# Patient Record
Sex: Female | Born: 1950 | Race: White | Hispanic: No | Marital: Married | Smoking: Never smoker
Health system: Southern US, Community
[De-identification: ages and names within clinical notes are randomized; demographics above are authoritative.]

## PROBLEM LIST (undated history)

## (undated) DIAGNOSIS — Z9221 Personal history of antineoplastic chemotherapy: Secondary | ICD-10-CM

## (undated) DIAGNOSIS — C801 Malignant (primary) neoplasm, unspecified: Secondary | ICD-10-CM

## (undated) HISTORY — PX: APPENDECTOMY: SHX54

## (undated) HISTORY — PX: TUBAL LIGATION: SHX77

## (undated) HISTORY — PX: BREAST SURGERY: SHX581

---

## 1995-10-13 DIAGNOSIS — Z853 Personal history of malignant neoplasm of breast: Secondary | ICD-10-CM | POA: Insufficient documentation

## 1996-06-01 ENCOUNTER — Encounter: Payer: Self-pay | Admitting: Family Medicine

## 1996-07-12 HISTORY — PX: MASTECTOMY: SHX3

## 2004-07-14 ENCOUNTER — Ambulatory Visit: Payer: Self-pay | Admitting: General Surgery

## 2004-10-12 LAB — HM COLONOSCOPY: HM Colonoscopy: NORMAL

## 2005-01-08 ENCOUNTER — Ambulatory Visit: Payer: Self-pay | Admitting: Oncology

## 2005-08-26 ENCOUNTER — Ambulatory Visit: Payer: Self-pay | Admitting: General Surgery

## 2006-10-25 ENCOUNTER — Ambulatory Visit: Payer: Self-pay | Admitting: General Surgery

## 2007-03-13 LAB — CONVERTED CEMR LAB: Pap Smear: NORMAL

## 2007-10-28 ENCOUNTER — Ambulatory Visit: Payer: Self-pay | Admitting: General Surgery

## 2008-11-12 ENCOUNTER — Ambulatory Visit: Payer: Self-pay | Admitting: General Surgery

## 2008-11-12 LAB — HM MAMMOGRAPHY: HM Mammogram: NORMAL

## 2008-11-22 ENCOUNTER — Encounter: Payer: Self-pay | Admitting: Family Medicine

## 2009-01-02 ENCOUNTER — Ambulatory Visit: Payer: Self-pay | Admitting: Family Medicine

## 2009-01-02 DIAGNOSIS — F325 Major depressive disorder, single episode, in full remission: Secondary | ICD-10-CM

## 2009-01-02 DIAGNOSIS — K219 Gastro-esophageal reflux disease without esophagitis: Secondary | ICD-10-CM | POA: Insufficient documentation

## 2009-01-02 DIAGNOSIS — I10 Essential (primary) hypertension: Secondary | ICD-10-CM | POA: Insufficient documentation

## 2009-01-02 DIAGNOSIS — M5030 Other cervical disc degeneration, unspecified cervical region: Secondary | ICD-10-CM | POA: Insufficient documentation

## 2009-01-02 DIAGNOSIS — G4700 Insomnia, unspecified: Secondary | ICD-10-CM | POA: Insufficient documentation

## 2009-01-02 DIAGNOSIS — K5909 Other constipation: Secondary | ICD-10-CM | POA: Insufficient documentation

## 2009-01-02 DIAGNOSIS — G43009 Migraine without aura, not intractable, without status migrainosus: Secondary | ICD-10-CM | POA: Insufficient documentation

## 2009-01-02 DIAGNOSIS — F331 Major depressive disorder, recurrent, moderate: Secondary | ICD-10-CM | POA: Insufficient documentation

## 2009-03-13 ENCOUNTER — Ambulatory Visit: Payer: Self-pay | Admitting: Family Medicine

## 2009-03-13 LAB — CONVERTED CEMR LAB
AST: 19 units/L (ref 0–37)
Albumin: 3.9 g/dL (ref 3.5–5.2)
CO2: 30 meq/L (ref 19–32)
Chloride: 113 meq/L — ABNORMAL HIGH (ref 96–112)
Cholesterol: 193 mg/dL (ref 0–200)
Creatinine, Ser: 0.9 mg/dL (ref 0.4–1.2)
GFR calc non Af Amer: 68.6 mL/min (ref >60)
HDL: 38.8 mg/dL — ABNORMAL LOW (ref >39.00)
Potassium: 4.6 meq/L (ref 3.5–5.1)
Sodium: 145 meq/L (ref 135–145)
Total Bilirubin: 0.8 mg/dL (ref 0.3–1.2)
Total Protein: 6.8 g/dL (ref 6.0–8.3)
Triglycerides: 96 mg/dL (ref 0.0–149.0)

## 2009-03-19 ENCOUNTER — Encounter: Payer: Self-pay | Admitting: Family Medicine

## 2009-03-19 ENCOUNTER — Ambulatory Visit: Payer: Self-pay | Admitting: Family Medicine

## 2009-03-19 ENCOUNTER — Other Ambulatory Visit: Admission: RE | Admit: 2009-03-19 | Discharge: 2009-03-19 | Payer: Self-pay | Admitting: Family Medicine

## 2009-03-19 DIAGNOSIS — N39498 Other specified urinary incontinence: Secondary | ICD-10-CM | POA: Insufficient documentation

## 2009-03-19 DIAGNOSIS — R7309 Other abnormal glucose: Secondary | ICD-10-CM | POA: Insufficient documentation

## 2009-03-19 DIAGNOSIS — E7800 Pure hypercholesterolemia, unspecified: Secondary | ICD-10-CM | POA: Insufficient documentation

## 2009-03-19 LAB — CONVERTED CEMR LAB
Blood in Urine, dipstick: NEGATIVE
Ketones, urine, test strip: NEGATIVE
Specific Gravity, Urine: >=1.03
Urobilinogen, UA: 0.2
WBC Urine, dipstick: NEGATIVE

## 2009-03-22 ENCOUNTER — Encounter (INDEPENDENT_AMBULATORY_CARE_PROVIDER_SITE_OTHER): Payer: Self-pay | Admitting: *Deleted

## 2009-03-27 ENCOUNTER — Encounter: Payer: Self-pay | Admitting: Family Medicine

## 2009-05-09 ENCOUNTER — Encounter: Payer: Self-pay | Admitting: Family Medicine

## 2009-05-29 ENCOUNTER — Ambulatory Visit: Payer: Self-pay | Admitting: Urology

## 2009-06-19 ENCOUNTER — Ambulatory Visit: Payer: Self-pay | Admitting: Family Medicine

## 2009-06-20 LAB — CONVERTED CEMR LAB
ALT: 16 units/L (ref 0–35)
AST: 17 units/L (ref 0–37)
Albumin: 4 g/dL (ref 3.5–5.2)
Alkaline Phosphatase: 99 units/L (ref 39–117)
Bilirubin, Direct: 0 mg/dL (ref 0.0–0.3)
GFR calc non Af Amer: 68.53 mL/min (ref >60)
LDL Cholesterol: 132 mg/dL — ABNORMAL HIGH (ref 0–99)
Total Bilirubin: 0.7 mg/dL (ref 0.3–1.2)
Total CHOL/HDL Ratio: 5
Total Protein: 6.9 g/dL (ref 6.0–8.3)
VLDL: 14.8 mg/dL (ref 0.0–40.0)

## 2009-08-08 ENCOUNTER — Ambulatory Visit: Payer: Self-pay | Admitting: Family Medicine

## 2009-08-22 ENCOUNTER — Ambulatory Visit: Payer: Self-pay | Admitting: Otolaryngology

## 2009-11-13 ENCOUNTER — Ambulatory Visit: Payer: Self-pay | Admitting: General Surgery

## 2009-11-19 ENCOUNTER — Encounter: Payer: Self-pay | Admitting: Family Medicine

## 2009-11-26 ENCOUNTER — Ambulatory Visit: Payer: Self-pay | Admitting: Family Medicine

## 2009-11-27 LAB — CONVERTED CEMR LAB
ALT: 16 units/L (ref 0–35)
Alkaline Phosphatase: 98 units/L (ref 39–117)
BUN: 15 mg/dL (ref 6–23)
CO2: 29 meq/L (ref 19–32)
Calcium: 9 mg/dL (ref 8.4–10.5)
Cholesterol: 199 mg/dL (ref 0–200)
GFR calc non Af Amer: 78.39 mL/min (ref >60)
Glucose, Bld: 99 mg/dL (ref 70–99)
HDL: 46.5 mg/dL (ref >39.00)
Total Bilirubin: 0.4 mg/dL (ref 0.3–1.2)

## 2010-02-21 ENCOUNTER — Ambulatory Visit: Payer: Self-pay | Admitting: Family Medicine

## 2010-02-25 LAB — CONVERTED CEMR LAB
AST: 20 units/L (ref 0–37)
Cholesterol: 157 mg/dL (ref 0–200)
Triglycerides: 117 mg/dL (ref 0.0–149.0)
VLDL: 23.4 mg/dL (ref 0.0–40.0)

## 2010-02-26 ENCOUNTER — Ambulatory Visit: Payer: Self-pay | Admitting: Family Medicine

## 2010-03-06 ENCOUNTER — Encounter: Admission: RE | Admit: 2010-03-06 | Discharge: 2010-03-06 | Payer: Self-pay | Admitting: Family Medicine

## 2010-05-14 ENCOUNTER — Telehealth: Payer: Self-pay | Admitting: Family Medicine

## 2010-05-16 ENCOUNTER — Ambulatory Visit: Payer: Self-pay | Admitting: Family Medicine

## 2010-05-16 DIAGNOSIS — G4730 Sleep apnea, unspecified: Secondary | ICD-10-CM | POA: Insufficient documentation

## 2010-05-22 ENCOUNTER — Telehealth (INDEPENDENT_AMBULATORY_CARE_PROVIDER_SITE_OTHER): Payer: Self-pay | Admitting: *Deleted

## 2010-05-23 ENCOUNTER — Ambulatory Visit: Payer: Self-pay

## 2010-05-27 ENCOUNTER — Ambulatory Visit: Payer: Self-pay

## 2010-05-27 ENCOUNTER — Encounter: Payer: Self-pay | Admitting: Family Medicine

## 2010-06-12 ENCOUNTER — Inpatient Hospital Stay: Payer: Self-pay

## 2010-07-07 ENCOUNTER — Telehealth: Payer: Self-pay | Admitting: Family Medicine

## 2010-07-22 ENCOUNTER — Ambulatory Visit: Payer: Self-pay

## 2010-08-07 ENCOUNTER — Telehealth: Payer: Self-pay | Admitting: Family Medicine

## 2010-08-27 ENCOUNTER — Ambulatory Visit: Payer: Self-pay | Admitting: Family Medicine

## 2010-08-28 LAB — CONVERTED CEMR LAB
ALT: 15 units/L (ref 0–35)
BUN: 15 mg/dL (ref 6–23)
CO2: 29 meq/L (ref 19–32)
Chloride: 105 meq/L (ref 96–112)
Cholesterol: 175 mg/dL (ref 0–200)
HDL: 46.2 mg/dL (ref >39.00)
LDL Cholesterol: 108 mg/dL — ABNORMAL HIGH (ref 0–99)
Potassium: 3.5 meq/L (ref 3.5–5.1)
Sodium: 142 meq/L (ref 135–145)
Total Bilirubin: 0.5 mg/dL (ref 0.3–1.2)

## 2010-09-09 ENCOUNTER — Ambulatory Visit: Payer: Self-pay | Admitting: Family Medicine

## 2010-09-09 DIAGNOSIS — G2581 Restless legs syndrome: Secondary | ICD-10-CM | POA: Insufficient documentation

## 2010-09-09 LAB — CONVERTED CEMR LAB: HDL goal, serum: 40 mg/dL

## 2010-09-23 ENCOUNTER — Ambulatory Visit: Payer: Self-pay

## 2010-09-23 ENCOUNTER — Encounter: Payer: Self-pay | Admitting: Family Medicine

## 2010-10-07 ENCOUNTER — Telehealth: Payer: Self-pay | Admitting: Family Medicine

## 2010-10-12 HISTORY — PX: REDUCTION MAMMAPLASTY: SUR839

## 2010-11-10 ENCOUNTER — Telehealth: Payer: Self-pay | Admitting: Family Medicine

## 2010-11-11 NOTE — Op Note (Signed)
Summary: Delay of TRAM Myocutaneous Flap/[facility]  Delay of TRAM Myocutaneous Flap/[facility]   Imported By: Lanelle Bal 08/12/2010 15:23:59  _____________________________________________________________________  External Attachment:    Type:   Image     Comment:   External Document

## 2010-11-11 NOTE — Progress Notes (Signed)
Summary: refill req for zolpidem  Phone Note Refill Request Message from:  Fax from Pharmacy on July 07, 2010 8:37 AM  Refills Requested: Medication #1:  ZOLPIDEM TARTRATE 10 MG TABS take one by mouth daily   Last Refilled: 05/23/2010 [facility] [address] [phone]  Initial call taken by: Melody Comas,  July 07, 2010 8:38 AM  Follow-up for Phone Call        Rx called to pharmacy Follow-up by: Benny Lennert CMA Duncan Dull),  July 07, 2010 10:08 AM    Prescriptions: ZOLPIDEM TARTRATE 10 MG TABS (ZOLPIDEM TARTRATE) take one by mouth daily  #30 x 0   Entered and Authorized by:   Kerby Nora MD   Signed by:   Kerby Nora MD on 07/07/2010   Method used:   Telephoned to ...       [facility] [phone]* (retail)       [address] [place], [place]  [address]       Ph: [phone] or [phone]       Fax: [phone]   RxID:   [id number]

## 2010-11-11 NOTE — Progress Notes (Signed)
Summary: Surgical clearance  Phone Note From Other Clinic Call back at fax [phone]   Caller: [facility] Call For: Dr. Ermalene Searing Summary of Call: Office calling to received surgical clearance notes/documentation.  Patient will be have breast reconstructive surgery in September.  Please fax to above fax number. Initial call taken by: Linde Gillis CMA Duncan Dull),  May 14, 2010 8:51 AM  Follow-up for Phone Call        Discussed with surgeon..low risk pt with moderate risk surgery. Recent pro op eval 07/2009.Marland KitchenMarland KitchenShe does not need a repeat appt unless she has had any health changes. Surgeon will order labs, EKG and CXR... if these are normal ..there is no contrindication to surgery. Please call pt or breast surgon office..do they need note or have forms to complete.  Follow-up by: Kerby Nora MD,  May 14, 2010 5:36 PM  Additional Follow-up for Phone Call Additional follow up Details #1::        Patient is coming in tomorrow for appt for surgical clearence.Consuello Masse CMA   Additional Follow-up by: Benny Lennert CMA Duncan Dull),  May 15, 2010 8:05 AM

## 2010-11-11 NOTE — Assessment & Plan Note (Signed)
Summary: BACK,HIP PAIN/CLE   Vital Signs:  Patient profile:   60 year old female Height:      65 inches Weight:      174.2 pounds BMI:     29.09 Temp:     98.3 degrees F oral Pulse rate:   80 / minute Pulse rhythm:   regular BP sitting:   130 / 80  (left arm) Cuff size:   regular  Vitals Entered By: Benny Lennert CMA Duncan Dull) (Feb 26, 2010 11:44 AM)  History of Present Illness: Chief complaint back and hip pain  60 year old female:  Back and hip pain.  s/p Breast CA  low back pain, now has moved some into her hip  also with some pain in her left groin ongoing for a couple of months.  Does a lot of lifting  Works at [employer].  Had a piched nerve.    Back Pain History:      The patient's back pain has been present for > 6 weeks.  The pain is located in the lower back region and does not radiate below the knees.  She states that she has had a prior history of back pain.  The patient has not had any recent physical therapy for her back pain.    Critical Exclusionary Diagnosis Criteria (CEDC) for Back Pain:      The patient denies a history of previous trauma.  She has no prior history of spinal surgery.  There are no symptoms to suggest infection, cauda equina, or psychosocial factors for back pain.  Cancer risk factors include age >50 yrs with new back pain, past medical history of cancer, and no improvement in low back pain after 4-6 weeks therapy.    Allergies (verified): No Known Drug Allergies  Past History:  Past medical, surgical, family and social histories (including risk factors) reviewed, and no changes noted (except as noted below).  Past Medical History: Reviewed history from 01/02/2009 and no changes required. Hypertension Depression GERD  Past Surgical History: Reviewed history from 01/02/2009 and no changes required. right mastectomy and chemotherapy.Marland KitchenMarland KitchenSees Dr. Linton Rump Choski Onc Appendectomy BTL   Family History: Reviewed history  from 01/02/2009 and no changes required. father: deceased, DM, HTN, Parkinson's, melanoma mother: deceased,DM, HTN, CVA 2 sisters: arthritis, HTN MGM: CVA PGM: CVA PGF: MI < age 3  Social History: Reviewed history from 01/02/2009 and no changes required. Occupation:works at [employer], Dispensing optician Married No children Never Smoked Alcohol use-no Drug use-no Regular exercise-no Diet: fast food, sweets  Review of Systems       REVIEW OF SYSTEMS  GEN: No systemic complaints, no fevers, chills, sweats, or other acute illnesses MSK: Detailed in the HPI GI: tolerating PO intake without difficulty Neuro: No numbness, parasthesias, or tingling associated. Otherwise the pertinent positives of the ROS are noted above.    Physical Exam  General:  GEN: Well-developed,well-nourished,in no acute distress; alert,appropriate and cooperative throughout examination HEENT: Normocephalic and atraumatic without obvious abnormalities. No apparent alopecia or balding. Ears, externally no deformities PULM: Breathing comfortably in no respiratory distress EXT: No clubbing, cyanosis, or edema PSYCH: Normally interactive. Cooperative during the interview. Pleasant. Friendly and conversant. Not anxious or depressed appearing. Normal, full affect.  Msk:  Normal Greater trochanteric bursae Full hip ROM Negative Faber Negative Reverse Faber Sciatic Notches: mildly ttp Sensation to Gross touch WNL Sensation to pinpricnk WNL DTR 2+ knee and ankle no clonus DP and PT pulses are normal B   Low Back  Pain Physical Exam:    Inspection-deformity:     No    Palpation-spinal tenderness:   No    Motor Exam/Strength:         Left Ankle Dorsiflexion (L5,L4):     normal       Left Great Toe Dorsiflexion (L5,L4):     normal       Left Heel Walk (L5,some L4):     normal       Left Single Squat & Rise-Quads (L4):   normal       Left Toe Walk-calf (S1):       normal       Right Ankle Dorsiflexion  (L5,L4):     normal       Right Great Toe Dorsiflexion (L5,L4):       normal       Right Heel Walk (L5,some L4):     normal       Right Single Squat & Rise Quads (L4):   normal       Right Toe Walk-calf (S1):       normal    Sensory Exam/Pinprick:        Left Medial Foot (L4):   normal       Left Dorsal Foot (L5):   normal       Left Lateral Foot (S1):   normal       Right Medial Foot (L4):   normal       Right Dorsal Foot (L5):   normal       Right Lateral Foot (S1):   normal    Reflexes:        Left Knee Jerk (L4):     normal       Left Ankle Reflex (S1):   normal       Right Knee Jerk:     normal       Right Ankle Reflex (S1):   normal    Straight Leg Raise (SLR):       Left Straight Leg Raise (SLR):   negative       Right Straight Leg Raise (SLR):   negative   Impression & Recommendations:  Problem # 1:  BACK PAIN, LUMBAR (ICD-724.2) Assessment New Mechanical back pain  proceed cons activity mod, weight loss, core rehab  Her updated medication list for this problem includes:    Flexeril 10 Mg Tabs (Cyclobenzaprine hcl) .Marland Kitchen... Take one by mouth as directed as needed    Ketoprofen 75 Mg Caps (Ketoprofen) .Marland Kitchen... Take one by mouth as directed as needed    Tizanidine Hcl 4 Mg Tabs (Tizanidine hcl) .Marland Kitchen... 1 by mouth at bedtime as needed back pain / spasm  Problem # 2:  HIP PAIN, BILATERAL (ICD-719.45) Assessment: New  Her updated medication list for this problem includes:    Flexeril 10 Mg Tabs (Cyclobenzaprine hcl) .Marland Kitchen... Take one by mouth as directed as needed    Ketoprofen 75 Mg Caps (Ketoprofen) .Marland Kitchen... Take one by mouth as directed as needed    Tizanidine Hcl 4 Mg Tabs (Tizanidine hcl) .Marland Kitchen... 1 by mouth at bedtime as needed back pain / spasm  Complete Medication List: 1)  Venlafaxine Hcl 75 Mg Tabs (Venlafaxine hcl) .... Take one by mouth daily 2)  Toprol Xl 100 Mg Xr24h-tab (Metoprolol succinate) .... Take one by mouth daily 3)  Omeprazole 20 Mg Cpdr (Omeprazole) .... Take  one by mouth daily 4)  Zolpidem Tartrate 10 Mg Tabs (Zolpidem tartrate) .... Take one by  mouth daily 5)  Flexeril 10 Mg Tabs (Cyclobenzaprine hcl) .... Take one by mouth as directed as needed 6)  Ketoprofen 75 Mg Caps (Ketoprofen) .... Take one by mouth as directed as needed 7)  Amerge 2.5 Mg Tabs (Naratriptan hcl) .... Take by mouth as directed as needed 8)  Senna Plus  .... Take by mouth as directed 9)  Calcium 600/vitamin D 600-400 Mg-unit Tabs (Calcium carbonate-vitamin d) .Marland Kitchen.. 1 tab by mouth two times a day 10)  Phendimetrazine Tartrate 35 Mg Tabs (Phendimetrazine tartrate) .... Take 1/2 to 1 tablet by mouth daily 11)  Simvastatin 20 Mg Tabs (Simvastatin) .... Take 1 tablet by mouth once a day 12)  Tizanidine Hcl 4 Mg Tabs (Tizanidine hcl) .Marland Kitchen.. 1 by mouth at bedtime as needed back pain / spasm  Other Orders: Radiology Referral (Radiology)  Patient Instructions: 1)  Referral Appointment Information 2)  Day/Date: 3)  Time: 4)  Place/MD: 5)  Address: 6)  Phone/Fax: 7)  Patient given appointment information. Information/Orders faxed/mailed.  Prescriptions: TIZANIDINE HCL 4 MG TABS (TIZANIDINE HCL) 1 by mouth at bedtime as needed back pain / spasm  #30 x 3   Entered and Authorized by:   Hannah Beat MD   Signed by:   Hannah Beat MD on 02/26/2010   Method used:   Print then Give to Patient   RxID:   [id number]   Current Allergies (reviewed today): No known allergies

## 2010-11-11 NOTE — Letter (Signed)
Summary: [facility]  [facility]   Imported By: Lanelle Bal 12/04/2009 13:00:45  _____________________________________________________________________  External Attachment:    Type:   Image     Comment:   External Document

## 2010-11-11 NOTE — Assessment & Plan Note (Signed)
Summary: PRE OP CLEARANCE/CLE   Vital Signs:  Patient profile:   60 year old female Height:      65 inches Weight:      177.0 pounds BMI:     29.56 Temp:     99.4 degrees F oral Pulse rate:   80 / minute Pulse rhythm:   regular BP sitting:   118 / 80  (left arm) Cuff size:   regular  Vitals Entered By: Benny Lennert CMA Duncan Dull) (May 16, 2010 11:14 AM)  History of Present Illness: Chief complaint pre op clearence.Beth Fox CMA    Upcoming breast reconstruction Surgery. 9/1 Has preop appt at hopsital in [facility].  Discussed on phone with Dr. Meriam Sprague. She with have CXR, EKG, labs done in hospital.   Depression , wel controlled on venlafaxine.  No SI.   Untreat sleep apnea...did not tolerate CPAP when tried 6-8 months ago.   Hypertension History:      She denies headache, chest pain, palpitations, dyspnea with exertion, peripheral edema, neurologic problems, and side effects from treatment.        Positive major cardiovascular risk factors include female age 22 years old or older, hyperlipidemia, and hypertension.  Negative major cardiovascular risk factors include non-tobacco-user status.     Allergies (verified): No Known Drug Allergies  Review of Systems General:  Denies fatigue and fever; Increased stress a twork and home. CV:  Denies chest pain or discomfort. Resp:  Denies shortness of breath. GI:  Denies abdominal pain, bloody stools, constipation, and diarrhea. GU:  Denies abnormal vaginal bleeding and dysuria. Derm:  Denies rash. Psych:  Denies anxiety.  Physical Exam  General:  GEN: Well-developed,well-nourished,in no acute distress; alert,appropriate and cooperative throughout examination HEENT: Normocephalic and atraumatic without obvious abnormalities. No apparent alopecia or balding. Ears, externally no deformities PULM: Breathing comfortably in no respiratory distress EXT: No clubbing, cyanosis, or edema PSYCH: Normally interactive. Cooperative  during the interview. Pleasant. Friendly and conversant. Not anxious or depressed appearing. Normal, full affect.  Eyes:  No corneal or conjunctival inflammation noted. EOMI. Perrla. Eyelids droopy B, left greater than right Vision grossly normal. Ears:  External ear exam shows no significant lesions or deformities.  Otoscopic examination reveals clear canals, tympanic membranes are intact bilaterally without bulging, retraction, inflammation or discharge. Hearing is grossly normal bilaterally. Nose:  External nasal examination shows no deformity or inflammation. Nasal mucosa are pink and moist without lesions or exudates. Mouth:  small oropharynx Neck:  no cervical or supraclavicular lymphadenopathy no carotid bruit or thyromegaly  Full ROM of neck Lungs:  Normal respiratory effort, chest expands symmetrically. Lungs are clear to auscultation, no crackles or wheezes. Heart:  Normal rate and regular rhythm. S1 and S2 normal without gallop, murmur, click, rub or other extra sounds. Abdomen:  Bowel sounds positive,abdomen soft and non-tender without masses, organomegaly or hernias noted. Pulses:  R and L posterior tibial pulses are full and equal bilaterally  Extremities:  no edmea Skin:  Intact without suspicious lesions or rashes   Impression & Recommendations:  Problem # 1:  PREOPERATIVE EXAMINATION (ICD-V72.84) Moderate risk procedure in low risk pt. Will clear for suregery when lab/tests return.  Problem # 2:  SLEEP APNEA (ICD-780.57) Counseled on risks of untreated sleep apnea.  Recommended discussing with sleep MD or center other CPAP mask etc /other treatment options.   Complete Medication List: 1)  Venlafaxine Hcl 75 Mg Tabs (Venlafaxine hcl) .... Take one by mouth daily 2)  Toprol Xl  100 Mg Xr24h-tab (Metoprolol succinate) .... Take one by mouth daily 3)  Omeprazole 20 Mg Cpdr (Omeprazole) .... Take one by mouth daily 4)  Zolpidem Tartrate 10 Mg Tabs (Zolpidem tartrate) .... Take  one by mouth daily 5)  Flexeril 10 Mg Tabs (Cyclobenzaprine hcl) .... Take one by mouth as directed as needed 6)  Ketoprofen 75 Mg Caps (Ketoprofen) .... Take one by mouth as directed as needed 7)  Amerge 2.5 Mg Tabs (Naratriptan hcl) .... Take by mouth as directed as needed 8)  Senna Plus  .... Take by mouth as directed 9)  Calcium 600/vitamin D 600-400 Mg-unit Tabs (Calcium carbonate-vitamin d) .Marland Kitchen.. 1 tab by mouth two times a day 10)  Phendimetrazine Tartrate 35 Mg Tabs (Phendimetrazine tartrate) .... Take 1/2 to 1 tablet by mouth daily 11)  Simvastatin 20 Mg Tabs (Simvastatin) .... Take 1 tablet by mouth once a day 12)  Tizanidine Hcl 4 Mg Tabs (Tizanidine hcl) .Marland Kitchen.. 1 by mouth at bedtime as needed back pain / spasm  Hypertension Assessment/Plan:      The patient's hypertensive risk group is category B: At least one risk factor (excluding diabetes) with no target organ damage.  Her calculated 10 year risk of coronary heart disease is 8 %.  Today's blood pressure is 118/80.  Her blood pressure goal is < 140/90.  Patient Instructions: 1)  Consider returning to sleep center for CPA/other treamtetn recommendations. Let them know it was not tolerated well.  2)   We will complete pre op paper work when labs recieved from [facility].  Current Allergies (reviewed today): No known allergies   Appended Document: PRE OP CLEARANCE/CLE Please print out this OV note and fax to surgeon. Labs reviewed..cleared for surgery.   Appended Document: PRE OP CLEARANCE/CLE faxed to surgeon.Beth Fox CMA

## 2010-11-11 NOTE — Progress Notes (Signed)
Summary: rx refills  Phone Note Refill Request Call back at Home Phone [phone] Message from:  Patient on May 22, 2010 9:39 AM  Refills Requested: Medication #1:  VENLAFAXINE HCL 75 MG TABS take one by mouth daily  Medication #2:  ZOLPIDEM TARTRATE 10 MG TABS take one by mouth daily [facility] [address]   Initial call taken by: Melody Comas,  May 22, 2010 9:39 AM    Prescriptions: ZOLPIDEM TARTRATE 10 MG TABS (ZOLPIDEM TARTRATE) take one by mouth daily  #30 x 0   Entered and Authorized by:   Kerby Nora MD   Signed by:   Kerby Nora MD on 05/23/2010   Method used:   Telephoned to ...       [facility] [phone]* (retail)       [address] [place], [place]  [address]       Ph: [phone] or [phone]       Fax: [phone]   RxID:   [id number] VENLAFAXINE HCL 75 MG TABS (VENLAFAXINE HCL) take one by mouth daily  #30 x 5   Entered and Authorized by:   Kerby Nora MD   Signed by:   Kerby Nora MD on 05/23/2010   Method used:   Telephoned to ...       [facility] [phone]* (retail)       [address] [place], [place]  [address]       Ph: [phone] or [phone]       Fax: [phone]   RxID:   [id number]   Appended Document: rx refills Rx's called to pharmacy.

## 2010-11-11 NOTE — Letter (Signed)
Summary: Records Dated 02-14-02 thru 03-11-05/[facility]  Records Dated 02-14-02 thru 03-11-05/[facility]   Imported By: Lanelle Bal 07/17/2010 13:21:54  _____________________________________________________________________  External Attachment:    Type:   Image     Comment:   External Document

## 2010-11-11 NOTE — Progress Notes (Signed)
Summary: zoldipem   Phone Note Refill Request Message from:  Fax from Pharmacy on August 07, 2010 5:16 PM  Refills Requested: Medication #1:  ZOLPIDEM TARTRATE 10 MG TABS take one by mouth daily   Last Refilled: 07/07/2010 [facility] [address] [phone]   Initial call taken by: Melody Comas,  August 07, 2010 5:17 PM  Follow-up for Phone Call        rx called to pharmacy.Consuello Masse CMA   Follow-up by: Benny Lennert CMA Duncan Dull),  August 08, 2010 11:34 AM    Prescriptions: ZOLPIDEM TARTRATE 10 MG TABS (ZOLPIDEM TARTRATE) take one by mouth daily  #30 x 0   Entered and Authorized by:   Kerby Nora MD   Signed by:   Kerby Nora MD on 08/08/2010   Method used:   Telephoned to ...       [facility] [phone]* (retail)       [address] [place], [place]  [address]       Ph: [phone] or [phone]       Fax: [phone]   RxID:   [id number]

## 2010-11-13 NOTE — Assessment & Plan Note (Signed)
Summary: TALK ABOUT MEDS/JRR   Vital Signs:  Patient profile:   60 year old female Height:      65 inches Weight:      160.8 pounds BMI:     26.86 Temp:     97.9 degrees F oral Pulse rate:   80 / minute Pulse rhythm:   regular BP sitting:   120 / 84  (left arm) Cuff size:   regular  Vitals Entered By: Benny Lennert CMA Duncan Dull) (September 09, 2010 11:53 AM)  History of Present Illness: Chief complaint talk about meds  Overdue for CPX.Marland Kitchen will schedule.  HTN:  Has been out of BP med in last month.. BPs..not checking at home. Here today, well controlled.   Since last OV... tram flap for breast reconstruction..06/2010.. has nipple surgery 12/15. Successful eyelid surgeries.  Prediabetes.. no improvement.   Depression.. stable on venlafaxine.   On ketoprophen and amerge.. for migraine..[facility] by Dr. Neale [facility]. Not planning on returning... mild headhaces only every 2 weeks.. Not required amerge in 1 year.  rarely using flexeril for neck (now improved)  Chornic constipation... uses senna every day.  Lifelong symptoms.  No blood in stool.   HAs upcoming routine colonoscopy with Dr. Birdie Sons.  LAst nml in 2006.  Dx with severe restless leg in past..during sleep study. Describes as cramping as well as creepy crawly feeling in legs.   Lipid Management History:      Positive NCEP/ATP III risk factors include female age 79 years old or older and hypertension.  Negative NCEP/ATP III risk factors include non-tobacco-user status.        Her compliance with the TLC diet is good.  The patient expresses understanding of adjunctive measures for cholesterol lowering.  Adjunctive measures started by the patient include aerobic exercise.  She expresses no side effects from her lipid-lowering medication.  The patient denies any symptoms to suggest myopathy or liver disease.     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 Take One By Mouth Daily 2)  Toprol Xl 100 Mg Xr24h-Tab (Metoprolol Succinate) .... Take One By Mouth Daily 3)  Omeprazole 20 Mg Cpdr (Omeprazole) .... Take One By Mouth Daily 4)  Zolpidem Tartrate 10 Mg Tabs (Zolpidem Tartrate) .... Take One By Mouth Daily 5)  Flexeril 10 Mg Tabs (Cyclobenzaprine Hcl) .... Take One By Mouth As Directed As Needed 6)  Ketoprofen 75 Mg Caps (Ketoprofen) .... Take One By Mouth As Directed As Needed 7)  Amerge 2.5 Mg Tabs (Naratriptan Hcl) .... Take By Mouth As Directed As Needed 8)  Senna Plus .... Take By Mouth As Directed 9)  Calcium 600/vitamin D 600-400 Mg-Unit Tabs (Calcium Carbonate-Vitamin D) .Marland Kitchen.. 1 Tab By Mouth Two Times A Day 10)  Simvastatin 20 Mg Tabs (Simvastatin) .... Take 1 Tablet By Mouth Once A Day 11)  Tizanidine Hcl 4 Mg Tabs (Tizanidine Hcl) .Marland KitchenMarland KitchenMarland Kitchen  1 By Mouth At Bedtime As Needed Back Pain / Spasm  Allergies (verified): No Known Drug Allergies  Past History:  Past medical, surgical, family and social histories (including risk factors) reviewed, and no changes noted (except as noted below).  Past Medical History: Reviewed history from 01/02/2009 and no changes required. Hypertension Depression GERD  Past Surgical History: Reviewed history from 01/02/2009 and no changes required. right mastectomy and chemotherapy.Marland KitchenMarland KitchenSees Dr.  Linton Rump Choski Onc Appendectomy BTL   Family History: Reviewed history from 01/02/2009 and no changes required. father: deceased, DM, HTN, Parkinson's, melanoma mother: deceased,DM, HTN, CVA 2 sisters: arthritis, HTN MGM: CVA PGM: CVA PGF: MI < age 30  Social History: Reviewed history from 01/02/2009 and no changes required. 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 CV:  Denies chest pain or discomfort. Resp:  Denies shortness of breath. GI:  Denies abdominal pain. GU:  Denies dysuria.  Physical Exam  General:  Well-developed,well-nourished,in no acute distress; alert,appropriate and cooperative throughout examination Eyes:  No corneal or conjunctival inflammation noted. EOMI. Perrla. Eyelids droopy B, left greater than right Vision grossly normal. Ears:  External ear exam shows no significant lesions or deformities.  Otoscopic examination reveals clear canals, tympanic membranes are intact bilaterally without bulging, retraction, inflammation or discharge. Hearing is grossly normal bilaterally. Nose:  External nasal examination shows no deformity or inflammation. Nasal mucosa are pink and moist without lesions or exudates. Mouth:  small oropharynx Neck:  no cervical or supraclavicular lymphadenopathy no carotid bruit or thyromegaly  Full ROM of neck Lungs:  Normal respiratory effort, chest expands symmetrically. Lungs are clear to auscultation, no crackles or wheezes. Heart:  Normal rate and regular rhythm. S1 and S2 normal without gallop, murmur, click, rub or other extra sounds. Abdomen:  Bowel sounds positive,abdomen soft and non-tender without masses, organomegaly or hernias noted. Pulses:  R and L posterior tibial pulses are full and equal bilaterally  Extremities:  no edmea Skin:  Intact  without suspicious lesions or rashes Psych:  Cognition and judgment appear intact. Alert and cooperative with normal attention span and concentration. No apparent delusions, illusions, hallucinations   Impression & Recommendations:  Problem # 1:  HYPERTENSION (ICD-401.9) Assessment Improved Well controlled of med today. HAs not been using toprol for last month. Follow Bps of med... if greater than 140/90 restart.  The following medications were removed from the medication list:    Toprol Xl 100 Mg Xr24h-tab (Metoprolol succinate) .Marland Kitchen... Take one by mouth daily  BP today: 120/84 Prior BP: 118/80 (05/16/2010)  Prior 10 Yr Risk Heart Disease: 8 % (05/16/2010)  Labs Reviewed: K+: 3.5 (08/27/2010) Creat: : 0.8 (08/27/2010)   Chol: 175 (08/27/2010)   HDL: 46.20 (08/27/2010)   LDL: 108 (08/27/2010)   TG: 105.0 (08/27/2010)  Problem # 2:  HYPERCHOLESTEROLEMIA (ICD-272.0) Assessment: Unchanged Well controlled. Continue current medication.  Her updated medication list for this problem includes:    Simvastatin 20 Mg Tabs (Simvastatin) .Marland Kitchen... Take 1 tablet by mouth once a day  Labs Reviewed: SGOT: 17 (08/27/2010)   SGPT: 15 (08/27/2010)  Prior 10 Yr Risk Heart Disease: 8 % (05/16/2010)   HDL:46.20 (08/27/2010), 43.30 (02/21/2010)  LDL:108 (08/27/2010), 90 (21/30/8657)  Chol:175 (08/27/2010), 157 (02/21/2010)  Trig:105.0 (08/27/2010), 117.0 (02/21/2010)  Problem # 3:  PREDIABETES (ICD-790.29) Assessment: Deteriorated Encouraged exercise, weight loss, healthy eating habits.   Problem # 4:  DEPRESSION (ICD-311) Assessment: Unchanged Well controlled. Continue current medication.  Her updated medication list for this problem includes:    Venlafaxine Hcl 75 Mg Tabs (Venlafaxine hcl) .Marland Kitchen... Take one by mouth daily  Problem # 5:  RESTLESS LEG SYNDROME (ICD-333.94) Assessment: Deteriorated Trial of ropinirole titrate up... use lowest dose needed.   Complete Medication List: 1)  Venlafaxine  Hcl 75 Mg Tabs (Venlafaxine hcl) .... Take one by mouth daily 2)  Omeprazole 20 Mg Cpdr (Omeprazole) .... Take one by mouth daily 3)  Zolpidem Tartrate 10 Mg Tabs (Zolpidem tartrate) .... Take one by mouth daily 4)  Ketoprofen 75 Mg Caps (Ketoprofen) .... Take one by mouth every 6 hours  as needed 5)  Amerge 2.5 Mg Tabs (Naratriptan hcl) .... Take by mouth as directed as needed 6)  Senna Plus  .... Take by mouth as directed 7)  Calcium 600/vitamin D 600-400 Mg-unit Tabs (Calcium carbonate-vitamin d) .Marland Kitchen.. 1 tab by mouth two times a day 8)  Simvastatin 20 Mg Tabs (Simvastatin) .... Take 1 tablet by mouth once a day 9)  Tizanidine Hcl 4 Mg Tabs (Tizanidine hcl) .Marland Kitchen.. 1 by mouth at bedtime as needed back pain / spasm 10)  Ropinirole Hcl 1 Mg Tabs (Ropinirole hcl) .... 1/2 tab by mouth at bedtime then if not improved ..increase to 1 tab by mouth at bedtime.  Lipid Assessment/Plan:      Based on NCEP/ATP III, the patient's risk factor category is "2 or more risk factors and a calculated 10 year CAD risk of < 20%".  The patient's lipid goals are as follows: Total cholesterol goal is 200; LDL cholesterol goal is 130; HDL cholesterol goal is 40; Triglyceride goal is 150.  Her LDL cholesterol goal has been met.    Patient Instructions: 1)  Schedule CPX in next few months.  2)  HOLD toprol. 3)   Check blood pressure every few days.Marland Kitchen if more than 3 numbers above 140/90.Marland Kitchenrestart toprol, call for prescription.  4)  Cut back on sweets in diet.Marland Kitchenas well as bread etc. 5)   Continue simvastatin. 6)  May try miralax daily for stool softner.  Prescriptions: ROPINIROLE HCL 1 MG TABS (ROPINIROLE HCL) 1/2 tab by mouth at bedtime then if not improved ..increase to 1 tab by mouth at bedtime.  #30 x 0   Entered and Authorized by:   Kerby Nora MD   Signed by:   Kerby Nora MD on 09/09/2010   Method used:   Electronically to        [facility] [phone]* (retail)       [address] [place], [place]  [address]       Ph: [phone] or [phone]       Fax: [phone]   RxID:   [id number] AMERGE 2.5 MG TABS (NARATRIPTAN HCL) take by mouth as directed as needed  #9 x 0   Entered and Authorized by:   Kerby Nora MD   Signed by:   Kerby Nora MD on 09/09/2010   Method used:   Electronically to        [facility] [phone]* (retail)       [address] [place], [place]  [address]       Ph: [phone] or [phone]       Fax:  [phone]   RxID:   [id number] KETOPROFEN 75 MG CAPS (KETOPROFEN) take one by mouth every 6 hours  as needed  #60 x 1   Entered and Authorized by:   Kerby Nora MD   Signed by:   Kerby Nora MD on 09/09/2010   Method used:   Electronically to        [facility] [phone]* (retail)       [address] [place], [place]  [address]       Ph: [phone] or [phone]       Fax: [phone]   RxID:   [id number] ZOLPIDEM TARTRATE 10 MG TABS (ZOLPIDEM TARTRATE) take one by mouth daily  #30 x 0   Entered and Authorized by:   Kerby Nora MD   Signed by:   Kerby Nora MD on 09/09/2010   Method used:   Print then Give to Patient   RxID:   [id number]    Orders Added: 1)  Est. Patient Level IV [93235]    Current Allergies (reviewed today): No known allergies   Last Flu Vaccine:  given (08/08/2009 1:45:26 PM) Flu Vaccine Result Date:  09/09/2010 Flu Vaccine Result:  given Flu Vaccine Next Due:  1 yr Last Colonoscopy:  normal (10/12/2004 10:40:54 AM) Colonoscopy Next Due:  5 yr

## 2010-11-13 NOTE — Progress Notes (Signed)
Summary: Beth Fox  Phone Note Refill Request Message from:  Patient on October 07, 2010 10:04 AM  Refills Requested: Medication #1:  ZOLPIDEM TARTRATE 10 MG TABS take one by mouth daily   Supply Requested: 1 month   [phone]    Method Requested: Telephone to Pharmacy Initial call taken by: Benny Lennert CMA Duncan Dull),  October 07, 2010 10:04 AM  Follow-up for Phone Call        Rx called to pharmacy Follow-up by: Benny Lennert CMA Duncan Dull),  October 07, 2010 1:08 PM    Prescriptions: ZOLPIDEM TARTRATE 10 MG TABS (ZOLPIDEM TARTRATE) take one by mouth daily  #30 x 0   Entered and Authorized by:   Kerby Nora MD   Signed by:   Kerby Nora MD on 10/07/2010   Method used:   Telephoned to ...       [facility] [phone]* (retail)       [address] [place], [place]  [address]       Ph: [phone] or [phone]       Fax: [phone]   RxID:   [id number]

## 2010-11-13 NOTE — Op Note (Signed)
Summary: Breast Reconstruction/[facility]  Breast Reconstruction/[facility]   Imported By: Lanelle Bal 10/20/2010 08:38:09  _____________________________________________________________________  External Attachment:    Type:   Image     Comment:   External Document

## 2010-11-19 NOTE — Progress Notes (Signed)
Summary: zolpidem tartrate  Phone Note Refill Request Message from:  Fax from Pharmacy on November 10, 2010 10:31 AM  Refills Requested: Medication #1:  ZOLPIDEM TARTRATE 10 MG TABS take one by mouth daily   Last Refilled: 10/07/2010 [facility] [address] [phone].   Initial call taken by: Melody Comas,  November 10, 2010 10:32 AM  Follow-up for Phone Call        Rx called to pharmacy Follow-up by: Benny Lennert CMA Duncan Dull),  November 10, 2010 11:39 AM    Prescriptions: ZOLPIDEM TARTRATE 10 MG TABS (ZOLPIDEM TARTRATE) take one by mouth daily  #30 x 0   Entered and Authorized by:   Kerby Nora MD   Signed by:   Kerby Nora MD on 11/10/2010   Method used:   Telephoned to ...       [facility] [phone]* (retail)       [address] [place], [place]  [address]       Ph: [phone] or [phone]       Fax: [phone]   RxID:   [id number]

## 2010-12-05 ENCOUNTER — Ambulatory Visit (INDEPENDENT_AMBULATORY_CARE_PROVIDER_SITE_OTHER): Payer: BC Managed Care – PPO | Admitting: Family Medicine

## 2010-12-05 ENCOUNTER — Encounter: Payer: Self-pay | Admitting: Family Medicine

## 2010-12-05 ENCOUNTER — Other Ambulatory Visit: Payer: Self-pay | Admitting: Family Medicine

## 2010-12-05 ENCOUNTER — Ambulatory Visit (INDEPENDENT_AMBULATORY_CARE_PROVIDER_SITE_OTHER)
Admission: RE | Admit: 2010-12-05 | Discharge: 2010-12-05 | Disposition: A | Discharge status: Home / Self Care | Payer: BC Managed Care – PPO | Source: Ambulatory Visit | Attending: Family Medicine | Admitting: Family Medicine

## 2010-12-05 DIAGNOSIS — R1084 Generalized abdominal pain: Secondary | ICD-10-CM | POA: Insufficient documentation

## 2010-12-05 DIAGNOSIS — K5909 Other constipation: Secondary | ICD-10-CM

## 2010-12-05 DIAGNOSIS — R109 Unspecified abdominal pain: Secondary | ICD-10-CM

## 2010-12-05 HISTORY — DX: Malignant (primary) neoplasm, unspecified: C80.1

## 2010-12-05 MED ORDER — IOHEXOL 300 MG/ML  SOLN
100.0000 mL | Freq: Once | INTRAMUSCULAR | Status: AC | PRN
  Administered 2010-12-05: 100 mL via INTRAVENOUS

## 2010-12-08 ENCOUNTER — Telehealth: Payer: Self-pay | Admitting: Family Medicine

## 2010-12-09 NOTE — Assessment & Plan Note (Signed)
Summary: STOMACH PAIN,CRAMPING/CLE  [employer]   Vital Signs:  Patient profile:   60 year old female Height:      65 inches Weight:      158.50 pounds BMI:     26.47 Temp:     99.0 degrees F oral Pulse rate:   91 / minute Pulse rhythm:   regular BP sitting:   140 / 90  (left arm) Cuff size:   regular  Vitals Entered By: Linde Gillis CMA Duncan Dull) (December 05, 2010 9:10 AM) CC: stomach pains, cramping   History of Present Illness: 60 year old prediabetic female with history of chronic constipation comes to clinic today for diffuse abdominal pain.  Has noted bulge in lower abdomen present since sugery but larger now... getting larger during end of day... near tram surgical site. (Spoke with Careers adviser on phone.. he felt this was not due to the plastic surgery) Does do a lot of heavy lifting at work.  Continues to be very constipatied. Last BM last night.. fairly large, soft but strained some.   Taking senna daily... never tried miralax.  Intermittant nausea.  No blood in stool. No fever.  Breast tram sugery 6 months ago   Due for colonoscopy repeat in 2011... q 5 years because of breast cancer per pt.   Last nml was in 2006.   Problems Prior to Update: 1)  Restless Leg Syndrome  (ICD-333.94) 2)  Sleep Apnea  (ICD-780.57) 3)  Preoperative Examination  (ICD-V72.84) 4)  Routine Gynecological Examination  (ICD-V72.31) 5)  Preventive Health Care  (ICD-V70.0) 6)  Prediabetes  (ICD-790.29) 7)  Hypercholesterolemia  (ICD-272.0) 8)  Urinary Incontinence, Overflow  (ICD-788.39) 9)  Constipation, Chronic  (ICD-564.09) 10)  Gerd  (ICD-530.81) 11)  Insomnia, Chronic  (ICD-307.42) 12)  Degenerative Disc Disease, Cervical Spine  (ICD-722.4) 13)  Depression  (ICD-311) 14)  Migraine, Common  (ICD-346.10) 15)  Hypertension  (ICD-401.9) 16)  Adenocarcinoma, Breast, 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 Take One By Mouth  Daily 2)  Omeprazole 20 Mg Cpdr (Omeprazole) .... Take One By Mouth Daily 3)  Zolpidem Tartrate 10 Mg Tabs (Zolpidem Tartrate) .... Take One By Mouth Daily 4)  Ketoprofen 75 Mg Caps (Ketoprofen) .... Take One By Mouth Every 6 Hours  As Needed 5)  Amerge 2.5 Mg Tabs (Naratriptan Hcl) .... Take By Mouth As Directed As Needed 6)  Senna Plus .... Take By Mouth As Directed 7)  Calcium 600/vitamin D 600-400 Mg-Unit Tabs (Calcium Carbonate-Vitamin D) .Marland Kitchen.. 1 Tab By Mouth Two Times A Day 8)  Simvastatin 20 Mg Tabs (Simvastatin) .... Take 1 Tablet By Mouth Once A Day 9)  Tizanidine Hcl 4 Mg Tabs (Tizanidine Hcl) .Marland Kitchen.. 1 By Mouth At Bedtime As Needed Back Pain / Spasm 10)  Ropinirole Hcl 1 Mg Tabs (Ropinirole Hcl) .... 1/2 Tab By Mouth At Bedtime Then If Not Improved ..increase To 1 Tab By Mouth At Bedtime.  Allergies (verified): No Known Drug Allergies  Past History:  Past medical, surgical, family and social histories (including risk factors) reviewed, and no changes noted (except as noted below).  Past Medical History: Reviewed history from 01/02/2009 and no changes required. Hypertension Depression GERD  Past Surgical History: Reviewed history from 01/02/2009 and no changes required. right mastectomy and chemotherapy.Marland KitchenMarland KitchenSees Dr. Linton Rump Choski Onc Appendectomy BTL   Family History: Reviewed history from 01/02/2009 and no changes required. father: deceased, DM, HTN, Parkinson's, melanoma mother: deceased,DM, HTN, CVA  2 sisters: arthritis, HTN MGM: CVA PGM: CVA PGF: MI < age 31  Social History: Reviewed history from 01/02/2009 and no changes required. 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 CV:  Denies chest pain or discomfort. Resp:  Denies shortness of breath. GI:  Denies bloody stools, diarrhea,  and indigestion. GU:  Denies abnormal vaginal bleeding and dysuria.  Physical Exam  General:  Well-developed,well-nourished,in no acute distress; alert,appropriate and cooperative throughout examination Mouth:  MMM Neck:  no carotid bruit or thyromegaly no cervical or supraclavicular lymphadenopathy  Lungs:  Normal respiratory effort, chest expands symmetrically. Lungs are clear to auscultation, no crackles or wheezes. Heart:  Normal rate and regular rhythm. S1 and S2 normal without gallop, murmur, click, rub or other extra sounds. Abdomen:  TTp over mid to lower abdomen... when lying no mass noted, on standing.. firm mass/bulge in low abdomen near tram site... approximately 9 inches in length, oblong... tender to palpation Pulses:  R and L posterior tibial pulses are full and equal bilaterally  Extremities:  no edmea Skin:  Intact without suspicious lesions or rashes   Impression & Recommendations:  Problem # 1:  ABDOMINAL PAIN, GENERALIZED (ICD-789.07) Likely due to constipation... but concern regarding bulge in low abdome.Marland KitchenMarland Kitchen? weakneing of abdominal wall muscle or true hernia. Are getting larger per pt. Will eval with abd/pelvic CT scan to identify bulge.  Orders: Radiology Referral (Radiology)  Problem # 2:  CONSTIPATION, CHRONIC (ICD-564.09) Increase water and fiber. Treat with miralax... if not improving...consider amitiza.  Alos sue for colonoscopy for prevention as well.   Complete Medication List: 1)  Venlafaxine Hcl 75 Mg Tabs (Venlafaxine hcl) .... Take one by mouth daily 2)  Omeprazole 20 Mg Cpdr (Omeprazole) .... Take one by mouth daily 3)  Zolpidem Tartrate 10 Mg Tabs (Zolpidem tartrate) .... Take one by mouth daily 4)  Ketoprofen 75 Mg Caps (Ketoprofen) .... Take one by mouth every 6 hours  as needed 5)  Amerge 2.5 Mg Tabs (Naratriptan hcl) .... Take by mouth as directed as needed 6)  Senna Plus  .... Take by mouth as directed 7)  Calcium 600/vitamin D 600-400 Mg-unit  Tabs (Calcium carbonate-vitamin d) .Marland Kitchen.. 1 tab by mouth two times a day 8)  Simvastatin 20 Mg Tabs (Simvastatin) .... Take 1 tablet by mouth once a day 9)  Tizanidine Hcl 4 Mg Tabs (Tizanidine hcl) .Marland Kitchen.. 1 by mouth at bedtime as needed back pain / spasm 10)  Ropinirole Hcl 1 Mg Tabs (Ropinirole hcl) .... 1/2 tab by mouth at bedtime then if not improved ..increase to 1 tab by mouth at bedtime.  Patient Instructions: 1)  Increase water in diet. 2)  Mirilax every day for stool. Can continue or hold senna as needed. 3)  Referral Appointment Information 4)  Day/Date: 5)  Time: 6)  Place/MD: 7)  Address: 8)  Phone/Fax: 9)  Patient given appointment information. Information/Orders faxed/mailed.  10)  Follow up appt in 1 week for constipation.   Orders Added: 1)  Radiology Referral [Radiology] 2)  Est. Patient Level IV [[address]]    Current Allergies (reviewed today): No known allergies

## 2010-12-12 ENCOUNTER — Ambulatory Visit: Payer: BC Managed Care – PPO | Admitting: Family Medicine

## 2010-12-18 NOTE — Progress Notes (Signed)
Summary: Beth Fox   Phone Note Refill Request Message from:  Fax from Pharmacy on December 08, 2010 10:40 AM  Refills Requested: Medication #1:  ZOLPIDEM TARTRATE 10 MG TABS take one by mouth daily   Last Refilled: 11/10/2010 [facility] [address] [phone]  Initial call taken by: Melody Comas,  December 08, 2010 10:40 AM  Follow-up for Phone Call        Rx called to pharmacy Follow-up by: Benny Lennert CMA Duncan Dull),  December 08, 2010 10:57 AM    Prescriptions: ZOLPIDEM TARTRATE 10 MG TABS (ZOLPIDEM TARTRATE) take one by mouth daily  #30 x 0   Entered and Authorized by:   Kerby Nora MD   Signed by:   Kerby Nora MD on 12/08/2010   Method used:   Telephoned to ...       [facility] [phone]* (retail)       [address] [place], [place]  [address]       Ph: [phone] or [phone]       Fax: [phone]   RxID:   [id number]

## 2010-12-30 ENCOUNTER — Other Ambulatory Visit: Payer: Self-pay | Admitting: Family Medicine

## 2010-12-31 NOTE — Telephone Encounter (Signed)
Duplicate note

## 2010-12-31 NOTE — Telephone Encounter (Signed)
Is this okay to refill? 

## 2011-01-08 ENCOUNTER — Other Ambulatory Visit: Payer: Self-pay | Admitting: *Deleted

## 2011-01-08 NOTE — Telephone Encounter (Signed)
Please review ambien request. It was last called in on 2/27, which it is now due. (3/29). Thanks.

## 2011-01-08 NOTE — Telephone Encounter (Signed)
Denied.Marland Kitchen Beth Fox  Was called in on 2/27... Not due until 3/27.  See last ambien refill request in notes section... Please verify that it was called in correctly.

## 2011-01-09 MED ORDER — ZOLPIDEM TARTRATE 10 MG PO TABS: ORAL_TABLET | ORAL | Status: DC

## 2011-01-09 NOTE — Telephone Encounter (Signed)
My previous note was incorrect... I did not realize it was already past 3/27... Time flies. Denial of prescription retracted.  Please call in refill as below.

## 2011-01-09 NOTE — Telephone Encounter (Signed)
Rx called to pharmacy

## 2011-01-11 ENCOUNTER — Other Ambulatory Visit: Payer: Self-pay | Admitting: Family Medicine

## 2011-02-10 ENCOUNTER — Other Ambulatory Visit: Payer: Self-pay | Admitting: *Deleted

## 2011-02-11 MED ORDER — ZOLPIDEM TARTRATE 10 MG PO TABS: ORAL_TABLET | ORAL | Status: DC

## 2011-02-11 NOTE — Telephone Encounter (Signed)
Rx called to pharmacy

## 2011-02-23 ENCOUNTER — Other Ambulatory Visit: Payer: Self-pay | Admitting: Family Medicine

## 2011-03-11 ENCOUNTER — Other Ambulatory Visit: Payer: Self-pay | Admitting: *Deleted

## 2011-03-11 MED ORDER — ZOLPIDEM TARTRATE 10 MG PO TABS: ORAL_TABLET | ORAL | Status: DC

## 2011-03-12 NOTE — Telephone Encounter (Signed)
Rx called to pharmacy

## 2011-04-13 ENCOUNTER — Other Ambulatory Visit: Payer: Self-pay | Admitting: *Deleted

## 2011-04-13 NOTE — Telephone Encounter (Signed)
I believe I already sent this in a few moments ago. Please let me know if I am incorrect.

## 2011-04-13 NOTE — Telephone Encounter (Signed)
I did not get this refill and dont see it in the computer

## 2011-04-16 NOTE — Telephone Encounter (Signed)
Do you want 30 with O refills b/c i never got the other approved rx

## 2011-04-17 NOTE — Telephone Encounter (Signed)
Pharmacy never received refill so i called in 30 with 0 refills

## 2011-04-17 NOTE — Telephone Encounter (Signed)
I tried to send this in a second time prior to receiving this second note from you. Can you calkl pharmacy to make sure they have not received it. If not call in 30 with 0 RF.

## 2011-04-24 ENCOUNTER — Other Ambulatory Visit: Payer: Self-pay | Admitting: Family Medicine

## 2011-04-24 NOTE — Telephone Encounter (Signed)
Not if i can refill

## 2011-05-18 ENCOUNTER — Other Ambulatory Visit: Payer: Self-pay | Admitting: *Deleted

## 2011-05-18 MED ORDER — ZOLPIDEM TARTRATE 10 MG PO TABS: ORAL_TABLET | ORAL | Status: DC

## 2011-05-18 NOTE — Telephone Encounter (Signed)
Ok to refill #30, 0 refills 

## 2011-06-10 ENCOUNTER — Encounter: Payer: Self-pay | Admitting: Family Medicine

## 2011-06-12 ENCOUNTER — Encounter: Payer: Self-pay | Admitting: Family Medicine

## 2011-06-12 ENCOUNTER — Ambulatory Visit (INDEPENDENT_AMBULATORY_CARE_PROVIDER_SITE_OTHER): Payer: BC Managed Care – PPO | Admitting: Family Medicine

## 2011-06-12 DIAGNOSIS — K5909 Other constipation: Secondary | ICD-10-CM

## 2011-06-12 DIAGNOSIS — C50919 Malignant neoplasm of unspecified site of unspecified female breast: Secondary | ICD-10-CM

## 2011-06-12 DIAGNOSIS — N63 Unspecified lump in unspecified breast: Secondary | ICD-10-CM

## 2011-06-12 DIAGNOSIS — N6320 Unspecified lump in the left breast, unspecified quadrant: Secondary | ICD-10-CM

## 2011-06-12 DIAGNOSIS — M722 Plantar fascial fibromatosis: Secondary | ICD-10-CM | POA: Insufficient documentation

## 2011-06-12 MED ORDER — MELOXICAM 15 MG PO TABS: 15.0000 mg | ORAL_TABLET | Freq: Every day | ORAL | Status: DC

## 2011-06-12 MED ORDER — LUBIPROSTONE 8 MCG PO CAPS: 8.0000 ug | ORAL_CAPSULE | Freq: Two times a day (BID) | ORAL | Status: AC

## 2011-06-12 NOTE — Progress Notes (Signed)
  Subjective:    Patient ID: Beth Fox, female    DOB: [birth year], 60 y.o.   MRN: [id number]  HPI  60 year old presents with two new problems.  Both feet have been painful  Worse in last month when stands up in AM. During the day mainly pain in heels. Feels stinging in B soles when stands in AMs. Has history of restless leg syndrome on requip 1mg   She has been using comfortable shoes and arch supports. No improvement with walking. Has used advil, helps moderately.Marland Kitchen No SE.  She is on her feet all day at work.   In last month she has begun having pain in left breast, felt knot on left lateral breast. Has history of  Right tram flap and reduction in left breast, last 06/2010. No complications. Has history of right breast CA 14 years ago. Last mammogram in 2010, nml, skipped last year because breast sore after surgery.  She discussed with surgeon on phone.Marland Kitchen He felt it could be scar tissue, but he has not seen her for it.  Also continues to have constipation... Interested in [place] as we discussed at last appt.   Review of Systems  Constitutional: Negative for fever and fatigue.  HENT: Negative for ear pain.   Eyes: Negative for pain.  Respiratory: Negative for chest tightness and shortness of breath.   Cardiovascular: Negative for chest pain, palpitations and leg swelling.  Gastrointestinal: Positive for constipation. Negative for abdominal pain and blood in stool.  Genitourinary: Negative for dysuria.       Objective:   Physical Exam  Constitutional: She appears well-developed and well-nourished.  HENT:  Head: Normocephalic and atraumatic.  Neck: Normal range of motion. Neck supple.  Cardiovascular: Normal rate, regular rhythm, normal heart sounds and intact distal pulses.  Exam reveals no gallop and no friction rub.   No murmur heard. Pulmonary/Chest: Effort normal and breath sounds normal. No respiratory distress. She has no wheezes. She has no rales. She exhibits no  tenderness.  Abdominal: Soft. Bowel sounds are normal. There is no tenderness.  Genitourinary: There is breast tenderness. No breast swelling, discharge or bleeding.       Mass noted left breast at 4 oclock, oblong 1 inch by 0.5 inch, tender, no redness  Scarring from previous breast surgeries.  Musculoskeletal:       Right foot: She exhibits tenderness. She exhibits normal range of motion and no bony tenderness.       Left foot: She exhibits tenderness. She exhibits normal range of motion and no bony tenderness.       ttp over B insertion of plantar fascia, neg heel squeeze test, no other areas of ttp.          Assessment & Plan:

## 2011-06-12 NOTE — Patient Instructions (Addendum)
For plantar fasciitis: start ice massage, stretches per info given. Start meloxicam daily. Do not take at same time as ketoprofen.  Follow up in 2-3 weeks if not improving.   Start amitiza 8 mg twice daily for constipation, push fluids, increase fiber. Call if constipation not improving on this med... We can increase it.   Stop at front desk to set up ASAP referral for diagnostic mammo.Marland Kitchen Next week if able.

## 2011-06-12 NOTE — Assessment & Plan Note (Signed)
MAy be scar tissue, but concerning for recurrent breast cancer. Order diagnostic mammo urgently.

## 2011-06-12 NOTE — Assessment & Plan Note (Signed)
Eval in past negative. Idiopathic. Will proceed with trial of amitiza 8 mg BID.. May need higher dose.

## 2011-06-12 NOTE — Assessment & Plan Note (Signed)
NSAIDs, ice massage, stretches. Info given. Follow up if not improving in 2 weeks.

## 2011-06-18 NOTE — Progress Notes (Signed)
Addended byKerby Nora E on: 06/18/2011 08:30 AM   Modules accepted: Orders

## 2011-06-19 ENCOUNTER — Other Ambulatory Visit: Payer: Self-pay | Admitting: *Deleted

## 2011-06-19 MED ORDER — ZOLPIDEM TARTRATE 10 MG PO TABS: ORAL_TABLET | ORAL | Status: DC

## 2011-06-24 ENCOUNTER — Ambulatory Visit: Payer: Self-pay | Admitting: Family Medicine

## 2011-06-26 ENCOUNTER — Encounter: Payer: Self-pay | Admitting: Family Medicine

## 2011-06-29 ENCOUNTER — Telehealth: Payer: Self-pay | Admitting: *Deleted

## 2011-06-29 DIAGNOSIS — R928 Other abnormal and inconclusive findings on diagnostic imaging of breast: Secondary | ICD-10-CM

## 2011-06-29 DIAGNOSIS — N6320 Unspecified lump in the left breast, unspecified quadrant: Secondary | ICD-10-CM

## 2011-06-29 NOTE — Telephone Encounter (Signed)
Christie from [facility] called to verify that Dr. Ermalene Searing has received the fax that was sent on Friday.  Advised her she has.  She asked that we call her with the name of the surgeon that pt is referred to, along with her appt information.

## 2011-07-08 ENCOUNTER — Other Ambulatory Visit: Payer: Self-pay | Admitting: Family Medicine

## 2011-07-20 ENCOUNTER — Other Ambulatory Visit: Payer: Self-pay | Admitting: *Deleted

## 2011-07-20 MED ORDER — ZOLPIDEM TARTRATE 10 MG PO TABS: ORAL_TABLET | ORAL | Status: DC

## 2011-07-20 NOTE — Telephone Encounter (Signed)
[facility] 

## 2011-08-27 ENCOUNTER — Other Ambulatory Visit: Payer: Self-pay | Admitting: Family Medicine

## 2011-08-31 ENCOUNTER — Other Ambulatory Visit: Payer: Self-pay | Admitting: Family Medicine

## 2011-09-01 ENCOUNTER — Other Ambulatory Visit: Payer: Self-pay | Admitting: *Deleted

## 2011-09-01 MED ORDER — ZOLPIDEM TARTRATE 10 MG PO TABS: ORAL_TABLET | ORAL | Status: DC

## 2011-09-01 NOTE — Telephone Encounter (Signed)
Pt is asking that refill be [facility] to [facility] [address]  They told her they faxed a request yesterday but they havent heard back.  Pt is out of the medicine.

## 2011-09-01 NOTE — Telephone Encounter (Signed)
Rx called to pharmacy

## 2011-09-01 NOTE — Telephone Encounter (Signed)
Ok to refill #30, 0 refills 

## 2011-09-17 ENCOUNTER — Other Ambulatory Visit: Payer: Self-pay | Admitting: *Deleted

## 2011-09-17 MED ORDER — TIZANIDINE HCL 4 MG PO CAPS: 4.0000 mg | ORAL_CAPSULE | Freq: Every evening | ORAL | Status: DC | PRN

## 2011-10-01 ENCOUNTER — Ambulatory Visit (INDEPENDENT_AMBULATORY_CARE_PROVIDER_SITE_OTHER): Payer: BC Managed Care – PPO | Admitting: Family Medicine

## 2011-10-01 ENCOUNTER — Encounter: Payer: Self-pay | Admitting: Family Medicine

## 2011-10-01 DIAGNOSIS — J309 Allergic rhinitis, unspecified: Secondary | ICD-10-CM | POA: Insufficient documentation

## 2011-10-01 MED ORDER — FLUTICASONE PROPIONATE 50 MCG/ACT NA SUSP: 2.0000 | Freq: Every day | NASAL | Status: DC

## 2011-10-01 NOTE — Progress Notes (Signed)
  Subjective:    Patient ID: Beth Fox, female    DOB: 11-17-1950, 60 y.o.   MRN: [id number]  HPI CC: sinusitis?  60 yo with h/o migraines and R breast cancer presents with 1 wk h/o cough, now for last 5 d with nausea, some vomiting, HA.  Staying stuffy, coughing has improved.  + RN and PNDrainage.  HA different from migraines.  Feels burning sinus ache bilateral frontal and maxillary.  + nasal congestion but seems to be improving.  Staying nauseated - unsettled stomach.  When blowing nose, clear mucous.  So far has tried tylenol cold/flu and advil, did not help.  Using nasal saline as well.  No fevers/chills, abd pain, chest pain, SOB, rashes.  No ear pain or tooth pain.  + sick contacts at work.  No smokers at home.  No h/o asthma/COPD, + allergic rhinitis worse this season, ?pine.  Review of Systems Per HPI    Objective:   Physical Exam  Nursing note and vitals reviewed. Constitutional: She appears well-developed and well-nourished. No distress.  HENT:  Head: Normocephalic and atraumatic.  Right Ear: Hearing, tympanic membrane, external ear and ear canal normal.  Left Ear: Hearing, tympanic membrane, external ear and ear canal normal.  Nose: Mucosal edema and rhinorrhea present. Right sinus exhibits no maxillary sinus tenderness and no frontal sinus tenderness. Left sinus exhibits no maxillary sinus tenderness and no frontal sinus tenderness.  Mouth/Throat: Uvula is midline, oropharynx is clear and moist and mucous membranes are normal. No oropharyngeal exudate, posterior oropharyngeal edema, posterior oropharyngeal erythema or tonsillar abscesses.       Pale nasal mucosa  Eyes: Conjunctivae and EOM are normal. Pupils are equal, round, and reactive to light. No scleral icterus.  Neck: Normal range of motion. Neck supple.  Cardiovascular: Normal rate, regular rhythm, normal heart sounds and intact distal pulses.   No murmur heard. Pulmonary/Chest: Effort normal and breath  sounds normal. No respiratory distress. She has no wheezes. She has no rales.  Lymphadenopathy:    She has no cervical adenopathy.  Skin: Skin is warm and dry. No rash noted.       Assessment & Plan:

## 2011-10-01 NOTE — Patient Instructions (Signed)
I think this is more of an allergy flare. Treat with nasal saline continued, nasal steroid, and antihistamine (like allegra or clariitn). Update [place] if not improved with this treatment or if any fever >101.5 or worsening productive cough. Good to see you today, I hope you feel better.

## 2011-10-01 NOTE — Assessment & Plan Note (Signed)
Anticipate flare of allergic rhinitis after viral URTI. rec start antihistamine, INS. Update [place] if not improving as expected or fever >101 or worsening productive cough. Pt declines nausea med.

## 2011-10-07 ENCOUNTER — Other Ambulatory Visit: Payer: Self-pay | Admitting: Family Medicine

## 2011-10-12 ENCOUNTER — Other Ambulatory Visit: Payer: Self-pay | Admitting: *Deleted

## 2011-10-12 MED ORDER — ZOLPIDEM TARTRATE 10 MG PO TABS: ORAL_TABLET | ORAL | Status: DC

## 2011-10-12 NOTE — Telephone Encounter (Signed)
rx called to pharmacy 

## 2011-10-24 ENCOUNTER — Other Ambulatory Visit: Payer: Self-pay | Admitting: Family Medicine

## 2011-12-14 ENCOUNTER — Other Ambulatory Visit: Payer: Self-pay | Admitting: *Deleted

## 2011-12-14 MED ORDER — ZOLPIDEM TARTRATE 10 MG PO TABS: ORAL_TABLET | ORAL | Status: DC

## 2011-12-16 ENCOUNTER — Ambulatory Visit: Payer: Self-pay

## 2011-12-28 ENCOUNTER — Other Ambulatory Visit: Payer: Self-pay | Admitting: Family Medicine

## 2011-12-28 NOTE — Telephone Encounter (Signed)
Last fill 11-26-2011 last office visit 06-12-2011

## 2012-01-07 ENCOUNTER — Other Ambulatory Visit: Payer: Self-pay | Admitting: Family Medicine

## 2012-02-15 ENCOUNTER — Other Ambulatory Visit: Payer: Self-pay | Admitting: *Deleted

## 2012-02-15 MED ORDER — ZOLPIDEM TARTRATE 10 MG PO TABS: ORAL_TABLET | ORAL | Status: DC

## 2012-02-15 NOTE — Telephone Encounter (Signed)
Received faxed refill request from pharmacy. Is it okay to refill medication? 

## 2012-02-17 ENCOUNTER — Other Ambulatory Visit: Payer: Self-pay | Admitting: *Deleted

## 2012-02-18 ENCOUNTER — Other Ambulatory Visit: Payer: Self-pay

## 2012-02-18 NOTE — Telephone Encounter (Signed)
Pt left v/m has Ambien been refilled yet. Pt requested call back and can be reached at [phone]. I called [facility] [address] to see if refill of ambien approved on 02/15/12 by Dr Ermalene Searing had been called in. [facility] [address] said Herbert Seta called in on 02/17/12 for # 30 with 1 refill. The approval phone note on 02/15/12 appeared # 30 x 0 refill. Heather please clarify.

## 2012-02-18 NOTE — Telephone Encounter (Signed)
Yes 30 with 1 refill [place].

## 2012-02-18 NOTE — Telephone Encounter (Signed)
Spoke with dr Ermalene Searing 30 with 1 refill okay for patient to receive.no need to change with pharmacy

## 2012-02-18 NOTE — Telephone Encounter (Signed)
On accident i called in 59 with 1 refill is this okay

## 2012-02-28 ENCOUNTER — Other Ambulatory Visit: Payer: Self-pay | Admitting: Family Medicine

## 2012-04-03 ENCOUNTER — Other Ambulatory Visit: Payer: Self-pay | Admitting: Family Medicine

## 2012-04-05 ENCOUNTER — Other Ambulatory Visit: Payer: Self-pay

## 2012-04-19 ENCOUNTER — Other Ambulatory Visit: Payer: Self-pay | Admitting: *Deleted

## 2012-04-19 MED ORDER — ZOLPIDEM TARTRATE 10 MG PO TABS: ORAL_TABLET | ORAL | Status: DC

## 2012-04-19 NOTE — Telephone Encounter (Signed)
rx called to pharmacy 

## 2012-05-24 ENCOUNTER — Other Ambulatory Visit: Payer: Self-pay | Admitting: *Deleted

## 2012-05-24 NOTE — Telephone Encounter (Signed)
Overdue for CPx refill until appt made.

## 2012-05-24 NOTE — Telephone Encounter (Signed)
Ok to refill 

## 2012-05-26 NOTE — Telephone Encounter (Signed)
Left message for patient to return my call.

## 2012-06-03 NOTE — Telephone Encounter (Signed)
Left message asking pt to call back. 

## 2012-06-06 NOTE — Telephone Encounter (Signed)
Denied until patient calls and makes appointment.

## 2012-07-22 ENCOUNTER — Ambulatory Visit (INDEPENDENT_AMBULATORY_CARE_PROVIDER_SITE_OTHER): Payer: Self-pay | Admitting: Family Medicine

## 2012-07-22 ENCOUNTER — Encounter: Payer: Self-pay | Admitting: Family Medicine

## 2012-07-22 VITALS — BP 154/90 | HR 88 | Temp 98.6°F | Resp 20 | Ht 65.0 in | Wt 156.8 lb

## 2012-07-22 DIAGNOSIS — F3289 Other specified depressive episodes: Secondary | ICD-10-CM

## 2012-07-22 DIAGNOSIS — R7309 Other abnormal glucose: Secondary | ICD-10-CM

## 2012-07-22 DIAGNOSIS — I10 Essential (primary) hypertension: Secondary | ICD-10-CM

## 2012-07-22 DIAGNOSIS — E7800 Pure hypercholesterolemia, unspecified: Secondary | ICD-10-CM

## 2012-07-22 DIAGNOSIS — F329 Major depressive disorder, single episode, unspecified: Secondary | ICD-10-CM

## 2012-07-22 MED ORDER — SERTRALINE HCL 50 MG PO TABS: 50.0000 mg | ORAL_TABLET | Freq: Every day | ORAL | Status: DC

## 2012-07-22 MED ORDER — HYDROCHLOROTHIAZIDE 25 MG PO TABS: 25.0000 mg | ORAL_TABLET | Freq: Every day | ORAL | Status: DC

## 2012-07-22 NOTE — Assessment & Plan Note (Signed)
Due for re-eval. 

## 2012-07-22 NOTE — Patient Instructions (Addendum)
Start sertraline 50 mg at bedtime. Follow up in 2 weeks for mood recheck. Follow up in 1 month  for  CPX and mood re-eval as well. Fasting labs prior.  Start HCTZ 25 mg daily for Blood pressure control. Follow BP at home... Call if remaining greater than 140/90.

## 2012-07-22 NOTE — Assessment & Plan Note (Signed)
Poor control. Overdue for labs. Start HCTZ 25 mg daily. Follow Bp at home.  Info given on [facility] and health dept which may be cheaper for her to do CPX.

## 2012-07-22 NOTE — Assessment & Plan Note (Signed)
Will start on sertraline 50 mg at bedtime. Close follow up in 2 weeks. Pt refuses counseling. She states she will remain safe at home and will call us or helpline if anger worsens.

## 2012-07-22 NOTE — Progress Notes (Signed)
Subjective:    Patient ID: Beth Fox, female    DOB: 05-10-1951, 61 y.o.   MRN: [id number]  HPI  61 year old female presents for med refill.  She has not been seen in over a year. She does not have insurance.  She is overdue for her CPX.  Elevated Cholesterol:  Overdue for re-eval. Off zocor Lab Results  Component Value Date   CHOL 175 08/27/2010   HDL 46.20 08/27/2010   LDLCALC 108* 08/27/2010   TRIG 105.0 08/27/2010   CHOLHDL 4 08/27/2010  Diet compliance: Poor Exercise:None. Other complaints:  Hypertension:  Poor control not on any medication, had been on toprol XL 100 mg in past, but was take off this per pt for good BP control.  Overdue for lab check. Using medication without problems or lightheadedness:  Chest pain with exertion:None Edema:None Short of breath:None Average home BPs:Not checking Other issues:   Depression: She was doing well until 1 month ago.. She thought she could stop her effexor. (Having SE to this med) Since then she has had return of anger, she is tearful in office today. Poor sleep. Fatigued all the time. She has been on anti-depressants off and on for 15 years. Has seen a counselor in past.. She has not done well with this in past. She has had some increase in stress.   She is having SI, no plan. She "just want to crawl under a rock"  She is concerned about hurting someone else. No one in particular.Marland Kitchen "Whoever ticks me off"  GERD: On prilosec daily  Review of Systems  Constitutional: Negative for fever and fatigue.  HENT: Negative for ear pain.   Eyes: Negative for pain.  Respiratory: Negative for chest tightness and shortness of breath.   Cardiovascular: Negative for chest pain, palpitations and leg swelling.  Gastrointestinal: Negative for abdominal pain.  Genitourinary: Negative for dysuria.  Psychiatric/Behavioral: Positive for suicidal ideas, behavioral problems, disturbed wake/sleep cycle, dysphoric mood and decreased  concentration. Negative for self-injury.       Objective:   Physical Exam  Constitutional: Vital signs are normal. She appears well-developed and well-nourished. She is cooperative.  Non-toxic appearance. She does not appear ill. No distress.  HENT:  Head: Normocephalic.  Right Ear: Hearing, tympanic membrane, external ear and ear canal normal. Tympanic membrane is not erythematous, not retracted and not bulging.  Left Ear: Hearing, tympanic membrane, external ear and ear canal normal. Tympanic membrane is not erythematous, not retracted and not bulging.  Nose: No mucosal edema or rhinorrhea. Right sinus exhibits no maxillary sinus tenderness and no frontal sinus tenderness. Left sinus exhibits no maxillary sinus tenderness and no frontal sinus tenderness.  Mouth/Throat: Uvula is midline, oropharynx is clear and moist and mucous membranes are normal.  Eyes: Conjunctivae normal, EOM and lids are normal. Pupils are equal, round, and reactive to light. No foreign bodies found.  Neck: Trachea normal and normal range of motion. Neck supple. Carotid bruit is not present. No mass and no thyromegaly present.  Cardiovascular: Normal rate, regular rhythm, S1 normal, S2 normal, normal heart sounds, intact distal pulses and normal pulses.  Exam reveals no gallop and no friction rub.   No murmur heard. Pulmonary/Chest: Effort normal and breath sounds normal. Not tachypneic. No respiratory distress. She has no decreased breath sounds. She has no wheezes. She has no rhonchi. She has no rales.  Abdominal: Soft. Normal appearance and bowel sounds are normal. There is no tenderness.  Neurological: She is alert.  Skin: Skin is warm, dry and intact. No rash noted.  Psychiatric: Judgment normal. Her mood appears not anxious. Her speech is delayed. She is slowed and withdrawn. Cognition and memory are normal. She exhibits a depressed mood. She expresses suicidal ideation. She expresses no homicidal ideation. She  expresses no suicidal plans and no homicidal plans. She is noncommunicative.          Assessment & Plan:

## 2012-08-04 ENCOUNTER — Ambulatory Visit: Payer: Self-pay | Admitting: Family Medicine

## 2012-11-18 ENCOUNTER — Ambulatory Visit (INDEPENDENT_AMBULATORY_CARE_PROVIDER_SITE_OTHER): Payer: BC Managed Care – PPO | Admitting: Family Medicine

## 2012-11-18 ENCOUNTER — Encounter: Payer: Self-pay | Admitting: Family Medicine

## 2012-11-18 VITALS — BP 120/70 | HR 93 | Temp 99.1°F | Ht 65.0 in | Wt 161.0 lb

## 2012-11-18 DIAGNOSIS — F332 Major depressive disorder, recurrent severe without psychotic features: Secondary | ICD-10-CM

## 2012-11-18 DIAGNOSIS — E7800 Pure hypercholesterolemia, unspecified: Secondary | ICD-10-CM

## 2012-11-18 DIAGNOSIS — I10 Essential (primary) hypertension: Secondary | ICD-10-CM

## 2012-11-18 DIAGNOSIS — R7309 Other abnormal glucose: Secondary | ICD-10-CM

## 2012-11-18 DIAGNOSIS — G2581 Restless legs syndrome: Secondary | ICD-10-CM

## 2012-11-18 MED ORDER — SERTRALINE HCL 50 MG PO TABS: 50.0000 mg | ORAL_TABLET | Freq: Every day | ORAL | Status: DC

## 2012-11-18 MED ORDER — HYDROCHLOROTHIAZIDE 25 MG PO TABS: 25.0000 mg | ORAL_TABLET | Freq: Every day | ORAL | Status: DC

## 2012-11-18 MED ORDER — ROPINIROLE HCL 1 MG PO TABS: 1.0000 mg | ORAL_TABLET | Freq: Every day | ORAL | Status: DC

## 2012-11-18 NOTE — Assessment & Plan Note (Signed)
Due for re-eval. 

## 2012-11-18 NOTE — Assessment & Plan Note (Signed)
Well controlled. Continue current medication.  

## 2012-11-18 NOTE — Progress Notes (Signed)
  Subjective:    Patient ID: Beth Fox, female    DOB: 27-Oct-1950, 62 y.o.   MRN: [id number]  HPI  62 year old female presents for follow up. She never returned for CPX as requested in 08/2012.  never returned for labs.  Elevated Cholesterol:  Due for re-eval. Lab Results  Component Value Date   CHOL 175 08/27/2010   HDL 46.20 08/27/2010   LDLCALC 108* 08/27/2010   TRIG 105.0 08/27/2010   CHOLHDL 4 08/27/2010  Diet compliance: Exercise: Other complaints:  .Prediabetes:  Hypertension:  Well controlled on  HCTZ. Using medication without problems or lightheadedness:  Chest pain with exertion: Edema: Short of breath: Average home BPs: Other issues:   Depression: On sertraline 50 mg daily. No SE.  She reports 90 % improvement in mood. Sleeping okay at night.  No SI. No HI.  Issues with RLS.. ropinerole helped a lot but was 35 $ a month. She wants cheaper option.    Review of Systems  Constitutional: Negative for fever and fatigue.  HENT: Negative for ear pain.   Eyes: Negative for pain.  Respiratory: Negative for chest tightness and shortness of breath.   Cardiovascular: Negative for chest pain, palpitations and leg swelling.  Gastrointestinal: Negative for abdominal pain.  Genitourinary: Negative for dysuria.       Objective:   Physical Exam  Constitutional: Vital signs are normal. She appears well-developed and well-nourished. She is cooperative.  Non-toxic appearance. She does not appear ill. No distress.  HENT:  Head: Normocephalic.  Right Ear: Hearing, tympanic membrane, external ear and ear canal normal. Tympanic membrane is not erythematous, not retracted and not bulging.  Left Ear: Hearing, tympanic membrane, external ear and ear canal normal. Tympanic membrane is not erythematous, not retracted and not bulging.  Nose: No mucosal edema or rhinorrhea. Right sinus exhibits no maxillary sinus tenderness and no frontal sinus tenderness. Left sinus  exhibits no maxillary sinus tenderness and no frontal sinus tenderness.  Mouth/Throat: Uvula is midline, oropharynx is clear and moist and mucous membranes are normal.  Eyes: Conjunctivae normal, EOM and lids are normal. Pupils are equal, round, and reactive to light. No foreign bodies found.  Neck: Trachea normal and normal range of motion. Neck supple. Carotid bruit is not present. No mass and no thyromegaly present.  Cardiovascular: Normal rate, regular rhythm, S1 normal, S2 normal, normal heart sounds, intact distal pulses and normal pulses.  Exam reveals no gallop and no friction rub.   No murmur heard. Pulmonary/Chest: Effort normal and breath sounds normal. Not tachypneic. No respiratory distress. She has no decreased breath sounds. She has no wheezes. She has no rhonchi. She has no rales.  Abdominal: Soft. Normal appearance and bowel sounds are normal. There is no tenderness.  Neurological: She is alert.  Skin: Skin is warm, dry and intact. No rash noted.  Psychiatric: Her speech is normal and behavior is normal. Judgment and thought content normal. Her mood appears not anxious. Cognition and memory are normal. She does not exhibit a depressed mood.          Assessment & Plan:

## 2012-11-18 NOTE — Assessment & Plan Note (Signed)
Well controlled now on sertraline 50 mg daily.

## 2012-11-18 NOTE — Assessment & Plan Note (Signed)
Retry ropinerole generic. Call if too expensive.

## 2012-11-18 NOTE — Patient Instructions (Addendum)
Continue current dose of sertraline. Set up CPX now that you can with fasting labs prior.  Work on regular exercise and healthy eating.  Retry ropinerole, call if too expensive.

## 2013-01-06 ENCOUNTER — Other Ambulatory Visit (HOSPITAL_COMMUNITY)
Admission: RE | Admit: 2013-01-06 | Discharge: 2013-01-06 | Disposition: A | Discharge status: Home / Self Care | Payer: BC Managed Care – PPO | Source: Ambulatory Visit | Attending: Family Medicine | Admitting: Family Medicine

## 2013-01-06 ENCOUNTER — Ambulatory Visit (INDEPENDENT_AMBULATORY_CARE_PROVIDER_SITE_OTHER): Payer: BC Managed Care – PPO | Admitting: Family Medicine

## 2013-01-06 ENCOUNTER — Encounter: Payer: Self-pay | Admitting: Family Medicine

## 2013-01-06 VITALS — BP 120/74 | HR 69 | Temp 98.2°F | Ht 65.0 in | Wt 164.0 lb

## 2013-01-06 DIAGNOSIS — Z1212 Encounter for screening for malignant neoplasm of rectum: Secondary | ICD-10-CM

## 2013-01-06 DIAGNOSIS — E2839 Other primary ovarian failure: Secondary | ICD-10-CM

## 2013-01-06 DIAGNOSIS — R7309 Other abnormal glucose: Secondary | ICD-10-CM

## 2013-01-06 DIAGNOSIS — Z01419 Encounter for gynecological examination (general) (routine) without abnormal findings: Secondary | ICD-10-CM | POA: Insufficient documentation

## 2013-01-06 DIAGNOSIS — Z1151 Encounter for screening for human papillomavirus (HPV): Secondary | ICD-10-CM | POA: Insufficient documentation

## 2013-01-06 DIAGNOSIS — E7800 Pure hypercholesterolemia, unspecified: Secondary | ICD-10-CM

## 2013-01-06 DIAGNOSIS — Z23 Encounter for immunization: Secondary | ICD-10-CM

## 2013-01-06 DIAGNOSIS — Z0000 Encounter for general adult medical examination without abnormal findings: Secondary | ICD-10-CM

## 2013-01-06 DIAGNOSIS — I10 Essential (primary) hypertension: Secondary | ICD-10-CM

## 2013-01-06 DIAGNOSIS — F332 Major depressive disorder, recurrent severe without psychotic features: Secondary | ICD-10-CM

## 2013-01-06 LAB — COMPREHENSIVE METABOLIC PANEL
ALT: 18 U/L (ref 0–35)
AST: 22 U/L (ref 0–37)
Alkaline Phosphatase: 91 U/L (ref 39–117)
BUN: 18 mg/dL (ref 6–23)
Creatinine, Ser: 0.8 mg/dL (ref 0.4–1.2)
Total Bilirubin: 0.5 mg/dL (ref 0.3–1.2)

## 2013-01-06 LAB — LIPID PANEL
Cholesterol: 248 mg/dL — ABNORMAL HIGH (ref 0–200)
HDL: 47 mg/dL (ref >39.00)
Total CHOL/HDL Ratio: 5
Triglycerides: 96 mg/dL (ref 0.0–149.0)
VLDL: 19.2 mg/dL (ref 0.0–40.0)

## 2013-01-06 NOTE — Addendum Note (Signed)
Addended by: Alvina Chou on: 01/06/2013 11:22 AM   Modules accepted: Orders

## 2013-01-06 NOTE — Assessment & Plan Note (Signed)
IMproved on sertraline

## 2013-01-06 NOTE — Patient Instructions (Addendum)
Stop at lab on your way out. Start regular exercise regimen.Marland Kitchen 3-5 times a week. Decrease sweets in diet. Stop front desk to set up bone density. Call to schedule mammogram on your own.

## 2013-01-06 NOTE — Assessment & Plan Note (Signed)
Due for eval. 

## 2013-01-06 NOTE — Progress Notes (Signed)
Subjective:    Patient ID: Beth Fox, female    DOB: 1951/03/31, 62 y.o.   MRN: [id number]  HPI 62 year old female  The patient is here for annual wellness exam and preventative care.    Has history of Right tram flap and reduction in left breast, last 06/2010. No complications.  Has history of right breast CA 14 years ago.  Hypertension:  Well controlled on HCTZ. Needs eval of electrolytes.   Using medication without problems or lightheadedness: None Chest pain with exertion:None Edema:None Short of breath:None Average home WUJ:WJXB checking Other issues:  Prediabetes:Due for re-eval.  Elevated Cholesterol:  Due for re-eval. ON no med, several years ago she was on statin had mild SE> Lab Results  Component Value Date   CHOL 175 08/27/2010   HDL 46.20 08/27/2010   LDLCALC 108* 08/27/2010   TRIG 105.0 08/27/2010   CHOLHDL 4 08/27/2010  Diet compliance: Poor, eating a lot of sweets. 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 She feels much better, she feels mood is where it needs to be. No SI, no HI. Sleeping moderately well.     Review of Systems  Constitutional: Negative for fever, fatigue and unexpected weight change.  HENT: Negative for ear pain, congestion, sore throat, sneezing, trouble swallowing and sinus pressure.   Eyes: Negative for pain and itching.  Respiratory: Negative for cough, shortness of breath and wheezing.   Cardiovascular: Negative for chest pain, palpitations and leg swelling.  Gastrointestinal: Negative for nausea, abdominal pain, diarrhea, constipation and blood in stool.  Genitourinary: Negative for dysuria, hematuria, vaginal discharge, difficulty urinating and menstrual problem.  Skin: Negative for rash.  Neurological: Negative for syncope, weakness, light-headedness, numbness and headaches.  Psychiatric/Behavioral: Negative for confusion and dysphoric mood. The patient is not nervous/anxious.         Objective:   Physical Exam  Constitutional: Vital signs are normal. She appears well-developed and well-nourished. She is cooperative.  Non-toxic appearance. She does not appear ill. No distress.  HENT:  Head: Normocephalic.  Right Ear: Hearing, tympanic membrane, external ear and ear canal normal.  Left Ear: Hearing, tympanic membrane, external ear and ear canal normal.  Nose: Nose normal.  Eyes: Conjunctivae, EOM and lids are normal. Pupils are equal, round, and reactive to light. No foreign bodies found.  Neck: Trachea normal and normal range of motion. Neck supple. Carotid bruit is not present. No mass and no thyromegaly present.  Cardiovascular: Normal rate, regular rhythm, S1 normal, S2 normal, normal heart sounds and intact distal pulses.  Exam reveals no gallop.   No murmur heard. Pulmonary/Chest: Effort normal and breath sounds normal. No respiratory distress. She has no wheezes. She has no rhonchi. She has no rales.  Abdominal: Soft. Normal appearance and bowel sounds are normal. She exhibits no distension, no fluid wave, no abdominal bruit and no mass. There is no hepatosplenomegaly. There is no tenderness. There is no rebound, no guarding and no CVA tenderness. No hernia.  Genitourinary: Vagina normal and uterus normal. No breast swelling, tenderness, discharge or bleeding. Pelvic exam was performed with patient prone. There is no rash, tenderness or lesion on the right labia. There is no rash, tenderness or lesion on the left labia. Uterus is not enlarged and not tender. Cervix exhibits no motion tenderness, no discharge and no friability. Right adnexum displays no mass, no tenderness and no fullness. Left adnexum displays no mass, no tenderness and no fullness.  Lymphadenopathy:  She has no cervical adenopathy.    She has no axillary adenopathy.  Neurological: She is alert. She has normal strength. No cranial nerve deficit or sensory deficit.  Skin: Skin is warm, dry and  intact. No rash noted.  Psychiatric: Her speech is normal and behavior is normal. Judgment normal. Her mood appears not anxious. Cognition and memory are normal. She does not exhibit a depressed mood.          Assessment & Plan:  The patient's preventative maintenance and recommended screening tests for an annual wellness exam were reviewed in full today. Brought up to date unless services declined.  Counselled on the importance of diet, exercise, and its role in overall health and mortality. The patient's FH and SH was reviewed, including their home life, tobacco status, and drug and alcohol status.   Vaccines: Due for Td and shingles. She will get Td today and look into shingles vaccine.  Colon:Not sure when last colonoscopy done, likely over 10 years but was nml.  She has opted for iFOB yearly.  DEXA: No family history of osteoporosis, got menses average, menopause age 44, no steroid use. She is moderate risk so we will do DEXA now.  Mammo: Due, personal history of breast cancer.  PAp/DVE:  nml pap in 2010, no history of abnormal paps, on q2 year schedule, done today.  Nonsmoker.

## 2013-01-06 NOTE — Assessment & Plan Note (Signed)
Well controlled. Continue current medication.  

## 2013-01-06 NOTE — Addendum Note (Signed)
Addended byKerby Nora E on: 01/06/2013 11:09 AM   Modules accepted: Orders

## 2013-01-06 NOTE — Assessment & Plan Note (Signed)
Due for re-eval. 

## 2013-01-12 ENCOUNTER — Encounter: Payer: Self-pay | Admitting: *Deleted

## 2013-02-09 ENCOUNTER — Other Ambulatory Visit (INDEPENDENT_AMBULATORY_CARE_PROVIDER_SITE_OTHER): Payer: BC Managed Care – PPO

## 2013-02-09 DIAGNOSIS — Z1212 Encounter for screening for malignant neoplasm of rectum: Secondary | ICD-10-CM

## 2013-05-05 ENCOUNTER — Encounter: Payer: Self-pay | Admitting: Family Medicine

## 2013-05-05 ENCOUNTER — Ambulatory Visit (INDEPENDENT_AMBULATORY_CARE_PROVIDER_SITE_OTHER): Payer: BC Managed Care – PPO | Admitting: Family Medicine

## 2013-05-05 VITALS — BP 140/84 | HR 99 | Temp 98.8°F | Ht 65.0 in | Wt 165.2 lb

## 2013-05-05 DIAGNOSIS — R21 Rash and other nonspecific skin eruption: Secondary | ICD-10-CM

## 2013-05-05 DIAGNOSIS — G2581 Restless legs syndrome: Secondary | ICD-10-CM

## 2013-05-05 LAB — CBC WITH DIFFERENTIAL/PLATELET
Eosinophils Absolute: 0.2 10*3/uL (ref 0.0–0.7)
Eosinophils Relative: 3 % (ref 0–5)
HCT: 37.1 % (ref 36.0–46.0)
Hemoglobin: 12 g/dL (ref 12.0–15.0)
Lymphs Abs: 1 10*3/uL (ref 0.7–4.0)
MCH: 27.1 pg (ref 26.0–34.0)
MCHC: 32.3 g/dL (ref 30.0–36.0)
MCV: 83.7 fL (ref 78.0–100.0)
Monocytes Absolute: 0.4 10*3/uL (ref 0.1–1.0)
Monocytes Relative: 8 % (ref 3–12)
Neutrophils Relative %: 68 % (ref 43–77)
RBC: 4.43 MIL/uL (ref 3.87–5.11)

## 2013-05-05 MED ORDER — ROPINIROLE HCL 1 MG PO TABS: 1.0000 mg | ORAL_TABLET | Freq: Two times a day (BID) | ORAL | Status: DC

## 2013-05-05 MED ORDER — TRIAMCINOLONE ACETONIDE 0.5 % EX CREA: TOPICAL_CREAM | Freq: Two times a day (BID) | CUTANEOUS | Status: DC

## 2013-05-05 NOTE — Patient Instructions (Addendum)
Increase requip to 1 mg in afternoon to also taking 1 mg at bedtime. Stop at lab.  Apply cream BID x 2 weeks.. Call if spreading or no better by then.

## 2013-05-05 NOTE — Assessment & Plan Note (Signed)
Check ferritin level. Increase requip.

## 2013-05-05 NOTE — Progress Notes (Signed)
  Subjective:    Patient ID: Beth Fox, female    DOB: 01/11/1951, 62 y.o.   MRN: [id number]  HPI  62 year old female presents with three weeks of rash on B legs. Progressively worsening. No new exposures, no medication, no sun exposure. She does sit on front porch for 15 min no sunscreen since a week. Not itchy, not painful.  Feeling well otherwise. No flu like symptoms, no ill feeling.  Has applied Gold Bond lotion but no medication.  No hx of similar rash.  Restless legs has gotten worse over the last 4 months. Using 1 mg at bedtime.  Has to take requip at 3-4 PM in afternoon. No SE.. It does make her somewhat sleepy.    Review of Systems  Constitutional: Negative for fever and fatigue.  HENT: Negative for ear pain.   Eyes: Negative for pain.  Respiratory: Negative for chest tightness and shortness of breath.   Cardiovascular: Negative for chest pain, palpitations and leg swelling.  Gastrointestinal: Negative for abdominal pain.  Genitourinary: Negative for dysuria.       Objective:   Physical Exam  Constitutional: Vital signs are normal. She appears well-developed and well-nourished. She is cooperative.  Non-toxic appearance. She does not appear ill. No distress.  HENT:  Head: Normocephalic.  Right Ear: Hearing, tympanic membrane, external ear and ear canal normal. Tympanic membrane is not erythematous, not retracted and not bulging.  Left Ear: Hearing, tympanic membrane, external ear and ear canal normal. Tympanic membrane is not erythematous, not retracted and not bulging.  Nose: No mucosal edema or rhinorrhea. Right sinus exhibits no maxillary sinus tenderness and no frontal sinus tenderness. Left sinus exhibits no maxillary sinus tenderness and no frontal sinus tenderness.  Mouth/Throat: Uvula is midline, oropharynx is clear and moist and mucous membranes are normal.  Eyes: Conjunctivae, EOM and lids are normal. Pupils are equal, round, and reactive to light. No  foreign bodies found.  Neck: Trachea normal and normal range of motion. Neck supple. Carotid bruit is not present. No mass and no thyromegaly present.  Cardiovascular: Normal rate, regular rhythm, S1 normal, S2 normal, normal heart sounds, intact distal pulses and normal pulses.  Exam reveals no gallop and no friction rub.   No murmur heard. Pulmonary/Chest: Effort normal and breath sounds normal. Not tachypneic. No respiratory distress. She has no decreased breath sounds. She has no wheezes. She has no rhonchi. She has no rales.  Abdominal: Soft. Normal appearance and bowel sounds are normal. There is no tenderness.  Neurological: She is alert.  Skin: Skin is warm, dry and intact. Rash noted. No purpura noted. Rash is macular. Rash is not papular, not maculopapular, not pustular, not vesicular and not urticarial.  Non-blanching round macules ( some central clearing on B anterior legs... Not on backs Vascular appearing  Psychiatric: Her speech is normal and behavior is normal. Judgment and thought content normal. Her mood appears not anxious. Cognition and memory are normal. She does not exhibit a depressed mood.          Assessment & Plan:

## 2013-05-05 NOTE — Assessment & Plan Note (Addendum)
Concerning for vasculitis  given location... Photosensitivity may be cause... On no photosensitizing medications. Check cbc.  Treat with topical steroid cream. Consider derm referral.

## 2013-05-06 LAB — FERRITIN: Ferritin: 16 ng/mL (ref 10–291)

## 2013-05-17 ENCOUNTER — Other Ambulatory Visit: Payer: Self-pay | Admitting: Family Medicine

## 2013-08-26 ENCOUNTER — Other Ambulatory Visit: Payer: Self-pay | Admitting: Family Medicine

## 2013-08-29 ENCOUNTER — Encounter: Payer: Self-pay | Admitting: Family Medicine

## 2013-08-29 ENCOUNTER — Ambulatory Visit (INDEPENDENT_AMBULATORY_CARE_PROVIDER_SITE_OTHER): Payer: BC Managed Care – PPO | Admitting: Family Medicine

## 2013-08-29 VITALS — BP 124/78 | HR 73 | Temp 97.9°F | Ht 65.0 in | Wt 171.2 lb

## 2013-08-29 DIAGNOSIS — R7309 Other abnormal glucose: Secondary | ICD-10-CM

## 2013-08-29 DIAGNOSIS — Z23 Encounter for immunization: Secondary | ICD-10-CM

## 2013-08-29 DIAGNOSIS — I10 Essential (primary) hypertension: Secondary | ICD-10-CM

## 2013-08-29 DIAGNOSIS — F332 Major depressive disorder, recurrent severe without psychotic features: Secondary | ICD-10-CM

## 2013-08-29 DIAGNOSIS — G8929 Other chronic pain: Secondary | ICD-10-CM | POA: Insufficient documentation

## 2013-08-29 DIAGNOSIS — M542 Cervicalgia: Secondary | ICD-10-CM

## 2013-08-29 DIAGNOSIS — E7800 Pure hypercholesterolemia, unspecified: Secondary | ICD-10-CM

## 2013-08-29 MED ORDER — SERTRALINE HCL 50 MG PO TABS: ORAL_TABLET | ORAL | Status: DC

## 2013-08-29 MED ORDER — KETOPROFEN 75 MG PO CAPS: 75.0000 mg | ORAL_CAPSULE | Freq: Three times a day (TID) | ORAL | Status: DC | PRN

## 2013-08-29 MED ORDER — MELOXICAM 15 MG PO TABS: 15.0000 mg | ORAL_TABLET | Freq: Every day | ORAL | Status: DC

## 2013-08-29 NOTE — Assessment & Plan Note (Signed)
Well controlled. Continue current medication.  

## 2013-08-29 NOTE — Assessment & Plan Note (Signed)
Due for re-eval. 

## 2013-08-29 NOTE — Progress Notes (Signed)
Subjective:    Patient ID: Beth Fox, female    DOB: 1951-02-15, 62 y.o.   MRN: [id number]  HPI 62 year old female presents for follow up.  She has been having issues with chronic pain in neck radiates to left arm.  Pain has been presents years but getting worse, difficulty doing job  Where she has to lift and push large items.  She has hx of disc issue in neck. X-rays per ortho showed this.. Never had MRI. No numbenss, no weakness  Also some low back pain.  Also some occ  pain in  B legs after stading a longtime. No weakness or numbness.  She is interested in medication to use for pain.   Hypertension: Well controlled on HCTZ.  Using medication without problems or lightheadedness: None  Chest pain with exertion:None  Edema:None  Short of breath:None  Average home NWG:NFAO checking  Other issues:   Prediabetes:Due for re-eval.    Has gained. Wt Readings from Last 3 Encounters:  08/29/13 171 lb 4 oz (77.678 kg)  05/05/13 165 lb 4 oz (74.957 kg)  01/06/13 164 lb (74.39 kg)     Elevated Cholesterol: Due for re-eval. ON no med, several years ago she was on statin had mild SE.  She never started red yeast rice. Lab Results  Component Value Date   CHOL 248* 01/06/2013   HDL 47.00 01/06/2013   LDLCALC 108* 08/27/2010   LDLDIRECT 194.4 01/06/2013   TRIG 96.0 01/06/2013   CHOLHDL 5 01/06/2013   Diet compliance: Poor, eating a lot of sweets.  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 She feels much better, she feels mood is where it needs to be. No SI, no HI.  Sleeping moderately well.       Review of Systems  Constitutional: Negative for fever and fatigue.  HENT: Negative for ear pain.   Eyes: Negative for pain.  Respiratory: Negative for chest tightness and shortness of breath.   Cardiovascular: Negative for chest pain, palpitations and leg swelling.  Gastrointestinal: Negative for abdominal pain.  Genitourinary: Negative  for dysuria.       Objective:   Physical Exam  Constitutional: Vital signs are normal. She appears well-developed and well-nourished. She is cooperative.  Non-toxic appearance. She does not appear ill. No distress.  HENT:  Head: Normocephalic.  Right Ear: Hearing, tympanic membrane, external ear and ear canal normal. Tympanic membrane is not erythematous, not retracted and not bulging.  Left Ear: Hearing, tympanic membrane, external ear and ear canal normal. Tympanic membrane is not erythematous, not retracted and not bulging.  Nose: No mucosal edema or rhinorrhea. Right sinus exhibits no maxillary sinus tenderness and no frontal sinus tenderness. Left sinus exhibits no maxillary sinus tenderness and no frontal sinus tenderness.  Mouth/Throat: Uvula is midline, oropharynx is clear and moist and mucous membranes are normal.  Eyes: Conjunctivae, EOM and lids are normal. Pupils are equal, round, and reactive to light. Lids are everted and swept, no foreign bodies found.  Neck: Trachea normal and normal range of motion. Neck supple. Carotid bruit is not present. No mass and no thyromegaly present.  Cardiovascular: Normal rate, regular rhythm, S1 normal, S2 normal, normal heart sounds, intact distal pulses and normal pulses.  Exam reveals no gallop and no friction rub.   No murmur heard. Pulmonary/Chest: Effort normal and breath sounds normal. Not tachypneic. No respiratory distress. She has no decreased breath sounds. She has no wheezes. She has no  rhonchi. She has no rales.  Abdominal: Soft. Normal appearance and bowel sounds are normal. There is no tenderness.  Musculoskeletal:       Cervical back: She exhibits decreased range of motion and tenderness. She exhibits no bony tenderness and no swelling.       Lumbar back: Normal. She exhibits normal range of motion, no tenderness and no swelling.  Neg SLR and neg spurlings  Neurological: She is alert. She has normal strength. No cranial nerve  deficit or sensory deficit. She displays a negative Romberg sign. Gait normal.  Skin: Skin is warm, dry and intact. No rash noted.  Psychiatric: Her speech is normal and behavior is normal. Judgment and thought content normal. Her mood appears not anxious. Cognition and memory are normal. She does not exhibit a depressed mood.          Assessment & Plan:

## 2013-08-29 NOTE — Patient Instructions (Addendum)
Start melocxicam ( do not use at same time as other antiinflammatories like ibuprofen or ketoprophen) Follow up in 2 weeks for neck pain.  Rerun for fasting labs in next few days.

## 2013-08-29 NOTE — Assessment & Plan Note (Signed)
Stable on current med. Refilled.

## 2013-08-29 NOTE — Progress Notes (Signed)
Pre-visit discussion using our clinic review tool. No additional management support is needed unless otherwise documented below in the visit note.  

## 2013-08-29 NOTE — Assessment & Plan Note (Signed)
Likely arthritis as well as disc pathology. Start meloxicam for pain and inflammation. Follow up in 2 weeks... Consider PT and further imaging at that time if not better.

## 2013-09-01 ENCOUNTER — Other Ambulatory Visit (INDEPENDENT_AMBULATORY_CARE_PROVIDER_SITE_OTHER): Payer: BC Managed Care – PPO

## 2013-09-01 DIAGNOSIS — I10 Essential (primary) hypertension: Secondary | ICD-10-CM

## 2013-09-01 DIAGNOSIS — E7800 Pure hypercholesterolemia, unspecified: Secondary | ICD-10-CM

## 2013-09-01 DIAGNOSIS — R7309 Other abnormal glucose: Secondary | ICD-10-CM

## 2013-09-01 LAB — COMPREHENSIVE METABOLIC PANEL
ALT: 22 U/L (ref 0–35)
AST: 19 U/L (ref 0–37)
Albumin: 3.7 g/dL (ref 3.5–5.2)
Alkaline Phosphatase: 88 U/L (ref 39–117)
CO2: 28 mEq/L (ref 19–32)
Chloride: 108 mEq/L (ref 96–112)
GFR: 72.16 mL/min (ref >60.00)
Sodium: 139 mEq/L (ref 135–145)
Total Bilirubin: 0.5 mg/dL (ref 0.3–1.2)
Total Protein: 6.8 g/dL (ref 6.0–8.3)

## 2013-09-01 LAB — LIPID PANEL
Total CHOL/HDL Ratio: 5
Triglycerides: 111 mg/dL (ref 0.0–149.0)

## 2013-09-12 ENCOUNTER — Encounter: Payer: Self-pay | Admitting: Family Medicine

## 2013-09-12 ENCOUNTER — Ambulatory Visit (INDEPENDENT_AMBULATORY_CARE_PROVIDER_SITE_OTHER): Payer: BC Managed Care – PPO | Admitting: Family Medicine

## 2013-09-12 VITALS — BP 122/80 | HR 100 | Temp 98.3°F | Ht 65.0 in | Wt 171.5 lb

## 2013-09-12 DIAGNOSIS — E7800 Pure hypercholesterolemia, unspecified: Secondary | ICD-10-CM

## 2013-09-12 DIAGNOSIS — G8929 Other chronic pain: Secondary | ICD-10-CM

## 2013-09-12 DIAGNOSIS — M542 Cervicalgia: Secondary | ICD-10-CM

## 2013-09-12 DIAGNOSIS — R7309 Other abnormal glucose: Secondary | ICD-10-CM

## 2013-09-12 NOTE — Assessment & Plan Note (Signed)
Improved control with NSAIDs... Use diclofenac prn. Refer to PT for further treatment.

## 2013-09-12 NOTE — Assessment & Plan Note (Signed)
Poor control. Handout given and reviewed in detail with pt regarding exercise and healthy eating. Start red yeast rice. Recheck in 6 months.

## 2013-09-12 NOTE — Patient Instructions (Addendum)
Start red yeast rice 600 mg 2 tab twice daily. Work on low cholesterol low fat diet. Review handout given today. Stop at front desk to set up referral to PT. Schedule CPX with fasting labs prior in 01/2014.

## 2013-09-12 NOTE — Assessment & Plan Note (Signed)
Encouraged exercise, weight loss, healthy eating habits. ? ?

## 2013-09-12 NOTE — Progress Notes (Signed)
Pre-visit discussion using our clinic review tool. No additional management support is needed unless otherwise documented below in the visit note.  

## 2013-09-12 NOTE — Progress Notes (Signed)
Subjective:    Patient ID: Beth Fox, female    DOB: 06-20-1951, 62 y.o.   MRN: [id number]  HPI  62 year old female presents for follow up on neck pain.  Seen on 11/18  She had been having issues with chronic pain in neck radiating  to left arm.  Pain has been presents years but getting worse, difficulty doing job Where she has to lift and push large items.  She has hx of disc issue in neck. X-rays per ortho showed this.. Never had MRI.  No numbness, no weakness  DX: arthritis as well as disc pathology.  Recomended starting meloxicam for pain and inflammation.   Since last OV she reports her pain is improved about 50%, no further radiation of pain. No new weakness, no new numbness. No fever. Now only using diclofenac once at night. No SE, no stomach upset.    Review of Systems  Constitutional: Negative for fever and fatigue.  HENT: Negative for ear pain.   Eyes: Negative for pain.  Respiratory: Negative for chest tightness and shortness of breath.   Cardiovascular: Negative for chest pain, palpitations and leg swelling.  Gastrointestinal: Negative for abdominal pain.  Genitourinary: Negative for dysuria.       Objective:   Physical Exam  Constitutional: Vital signs are normal. She appears well-developed and well-nourished. She is cooperative.  Non-toxic appearance. She does not appear ill. No distress.  HENT:  Head: Normocephalic.  Right Ear: Hearing, tympanic membrane, external ear and ear canal normal. Tympanic membrane is not erythematous, not retracted and not bulging.  Left Ear: Hearing, tympanic membrane, external ear and ear canal normal. Tympanic membrane is not erythematous, not retracted and not bulging.  Nose: No mucosal edema or rhinorrhea. Right sinus exhibits no maxillary sinus tenderness and no frontal sinus tenderness. Left sinus exhibits no maxillary sinus tenderness and no frontal sinus tenderness.  Mouth/Throat: Uvula is midline, oropharynx is clear  and moist and mucous membranes are normal.  Eyes: Conjunctivae, EOM and lids are normal. Pupils are equal, round, and reactive to light. Lids are everted and swept, no foreign bodies found.  Neck: Trachea normal and normal range of motion. Neck supple. Carotid bruit is not present. No mass and no thyromegaly present.  Cardiovascular: Normal rate, regular rhythm, S1 normal, S2 normal, normal heart sounds, intact distal pulses and normal pulses.  Exam reveals no gallop and no friction rub.   No murmur heard. Pulmonary/Chest: Effort normal and breath sounds normal. Not tachypneic. No respiratory distress. She has no decreased breath sounds. She has no wheezes. She has no rhonchi. She has no rales.  Abdominal: Soft. Normal appearance and bowel sounds are normal. There is no tenderness.  Musculoskeletal:       Cervical back: She exhibits decreased range of motion. She exhibits no tenderness, no bony tenderness and no swelling.       Lumbar back: Normal. She exhibits normal range of motion, no tenderness and no swelling.  neg spurlings  decreased lateral bend ROM, otherwise nml  Neurological: She is alert. She has normal strength. No cranial nerve deficit or sensory deficit. She displays a negative Romberg sign. Gait normal.  Skin: Skin is warm, dry and intact. No rash noted.  Psychiatric: Her speech is normal and behavior is normal. Judgment and thought content normal. Her mood appears not anxious. Cognition and memory are normal. She does not exhibit a depressed mood.          Assessment & Plan:

## 2013-11-06 ENCOUNTER — Other Ambulatory Visit: Payer: Self-pay | Admitting: Family Medicine

## 2013-12-25 ENCOUNTER — Other Ambulatory Visit: Payer: Self-pay | Admitting: Family Medicine

## 2014-01-03 ENCOUNTER — Telehealth: Payer: Self-pay | Admitting: Family Medicine

## 2014-01-03 DIAGNOSIS — I10 Essential (primary) hypertension: Secondary | ICD-10-CM

## 2014-01-03 DIAGNOSIS — E7800 Pure hypercholesterolemia, unspecified: Secondary | ICD-10-CM

## 2014-01-03 DIAGNOSIS — R7309 Other abnormal glucose: Secondary | ICD-10-CM

## 2014-01-03 NOTE — Telephone Encounter (Signed)
Message copied by Jinny Sanders on Wed Jan 03, 2014 10:47 PM ------      Message from: Selinda Orion J      Created: Mon Jan 01, 2014 12:08 PM      Regarding: Lab orders for Friday, 3.27.15       Patient is scheduled for CPX labs, please order future labs, Thanks , Terri       ------

## 2014-01-05 ENCOUNTER — Other Ambulatory Visit (INDEPENDENT_AMBULATORY_CARE_PROVIDER_SITE_OTHER): Payer: BC Managed Care – PPO

## 2014-01-05 DIAGNOSIS — I10 Essential (primary) hypertension: Secondary | ICD-10-CM

## 2014-01-05 DIAGNOSIS — E7800 Pure hypercholesterolemia, unspecified: Secondary | ICD-10-CM

## 2014-01-05 DIAGNOSIS — R7309 Other abnormal glucose: Secondary | ICD-10-CM

## 2014-01-05 LAB — LIPID PANEL
CHOL/HDL RATIO: 5
Cholesterol: 222 mg/dL — ABNORMAL HIGH (ref 0–200)
HDL: 48.8 mg/dL (ref >39.00)
LDL Cholesterol: 152 mg/dL — ABNORMAL HIGH (ref 0–99)
Triglycerides: 107 mg/dL (ref 0.0–149.0)
VLDL: 21.4 mg/dL (ref 0.0–40.0)

## 2014-01-05 LAB — COMPREHENSIVE METABOLIC PANEL
ALBUMIN: 4.2 g/dL (ref 3.5–5.2)
ALT: 22 U/L (ref 0–35)
AST: 21 U/L (ref 0–37)
Alkaline Phosphatase: 90 U/L (ref 39–117)
BILIRUBIN TOTAL: 0.6 mg/dL (ref 0.3–1.2)
BUN: 19 mg/dL (ref 6–23)
CHLORIDE: 101 meq/L (ref 96–112)
CO2: 31 mEq/L (ref 19–32)
Calcium: 9.8 mg/dL (ref 8.4–10.5)
Creatinine, Ser: 1 mg/dL (ref 0.4–1.2)
GFR: 59.07 mL/min — ABNORMAL LOW (ref >60.00)
Glucose, Bld: 112 mg/dL — ABNORMAL HIGH (ref 70–99)
POTASSIUM: 3.4 meq/L — AB (ref 3.5–5.1)
SODIUM: 139 meq/L (ref 135–145)
Total Protein: 7.4 g/dL (ref 6.0–8.3)

## 2014-01-05 LAB — HEMOGLOBIN A1C: HEMOGLOBIN A1C: 6.3 % (ref 4.6–6.5)

## 2014-01-12 ENCOUNTER — Encounter: Payer: BC Managed Care – PPO | Admitting: Family Medicine

## 2014-01-16 ENCOUNTER — Ambulatory Visit (INDEPENDENT_AMBULATORY_CARE_PROVIDER_SITE_OTHER): Payer: BC Managed Care – PPO | Admitting: Family Medicine

## 2014-01-16 ENCOUNTER — Encounter: Payer: Self-pay | Admitting: Family Medicine

## 2014-01-16 VITALS — BP 108/68 | HR 82 | Temp 98.0°F | Ht 63.75 in | Wt 169.8 lb

## 2014-01-16 DIAGNOSIS — M2550 Pain in unspecified joint: Secondary | ICD-10-CM

## 2014-01-16 DIAGNOSIS — Z1212 Encounter for screening for malignant neoplasm of rectum: Secondary | ICD-10-CM

## 2014-01-16 DIAGNOSIS — Z0000 Encounter for general adult medical examination without abnormal findings: Secondary | ICD-10-CM

## 2014-01-16 DIAGNOSIS — R5381 Other malaise: Secondary | ICD-10-CM

## 2014-01-16 DIAGNOSIS — E7800 Pure hypercholesterolemia, unspecified: Secondary | ICD-10-CM

## 2014-01-16 DIAGNOSIS — R5383 Other fatigue: Secondary | ICD-10-CM | POA: Insufficient documentation

## 2014-01-16 DIAGNOSIS — R7309 Other abnormal glucose: Secondary | ICD-10-CM

## 2014-01-16 DIAGNOSIS — F332 Major depressive disorder, recurrent severe without psychotic features: Secondary | ICD-10-CM

## 2014-01-16 DIAGNOSIS — L989 Disorder of the skin and subcutaneous tissue, unspecified: Secondary | ICD-10-CM

## 2014-01-16 LAB — RHEUMATOID FACTOR: Rhuematoid fact SerPl-aCnc: <10 IU/mL (ref <=14)

## 2014-01-16 NOTE — Assessment & Plan Note (Addendum)
Given small joints, stiffness > 1 hour and family history. Eval for autoimmune disease. If eval neg... Consider eval left knee for OA given area of worst pain. Consider lyme testing etc.

## 2014-01-16 NOTE — Assessment & Plan Note (Signed)
Stable

## 2014-01-16 NOTE — Progress Notes (Signed)
63 year old female  The patient is here for annual wellness exam and preventative care.   Has history of Right tram flap and reduction in left breast, last 06/2010. No complications.  Has history of right breast CA 14 years ago.   She has been getting more stiff and more fatigued in last 4 months.  All joints. Does not work out stiffness after mving entire day. She has always had stiffness in back and neck, but has more and more in other joints now as well... Knees, ankles, elbows, fingers. No redness, no swelling. Grandmother with possible RA versus severe OA.   Hypertension: Well controlled on HCTZ.  BP Readings from Last 3 Encounters:  01/16/14 108/68  09/12/13 122/80  08/29/13 124/78  Using medication without problems or lightheadedness: None  Chest pain with exertion:None  Edema:None Short of breath:rarely Average home BPs:Not checking  Other issues:   Prediabetes:persistently elevated, but stable  Elevated Cholesterol: Not at goal  LDL < 130 on  no med, several years ago she was on statin had mild SE.  Lab Results  Component Value Date   CHOL 222* 01/05/2014   HDL 48.80 01/05/2014   LDLCALC 152* 01/05/2014   LDLDIRECT 167.7 09/01/2013   TRIG 107.0 01/05/2014   CHOLHDL 5 01/05/2014  Diet compliance: Poor, eating a lot of sweets.  Exercise:  Other complaints:   Depression, major: Stable on sertraline 50 mg. She feels much better, she feels mood is where it needs to be. No SI, no HI.  Sleeping moderately well.   Review of Systems  Constitutional: Negative for fever, fatigue and unexpected weight change.  HENT: Negative for ear pain, congestion, sore throat, sneezing, trouble swallowing and sinus pressure.  Eyes: Negative for pain and itching.  Respiratory: Negative for cough, shortness of breath and wheezing.  Cardiovascular: Negative for chest pain, palpitations and leg swelling.  Gastrointestinal: Negative for nausea, abdominal pain, diarrhea, constipation and blood in  stool.  Genitourinary: Negative for dysuria, hematuria, vaginal discharge, difficulty urinating and menstrual problem.  Skin: Negative for rash.  Neurological: Negative for syncope, weakness, light-headedness, numbness and headaches.  Psychiatric/Behavioral: Negative for confusion and dysphoric mood. The patient is not nervous/anxious.  Objective:   Physical Exam  Constitutional: Vital signs are normal. She appears well-developed and well-nourished. She is cooperative. Non-toxic appearance. She does not appear ill. No distress.  HENT:  Head: Normocephalic.  Right Ear: Hearing, tympanic membrane, external ear and ear canal normal.  Left Ear: Hearing, tympanic membrane, external ear and ear canal normal.  Nose: Nose normal.  Eyes: Conjunctivae, EOM and lids are normal. Pupils are equal, round, and reactive to light. No foreign bodies found.  Neck: Trachea normal and normal range of motion. Neck supple. Carotid bruit is not present. No mass and no thyromegaly present.  Cardiovascular: Normal rate, regular rhythm, S1 normal, S2 normal, normal heart sounds and intact distal pulses. Exam reveals no gallop.  No murmur heard.  Pulmonary/Chest: Effort normal and breath sounds normal. No respiratory distress. She has no wheezes. She has no rhonchi. She has no rales.  Abdominal: Soft. Normal appearance and bowel sounds are normal. She exhibits no distension, no fluid wave, no abdominal bruit and no mass. There is no hepatosplenomegaly. There is no tenderness. There is no rebound, no guarding and no CVA tenderness. No hernia.  Genitourinary: Vagina normal and uterus normal. No breast swelling, tenderness, discharge or bleeding. Pelvic exam was performed with patient prone. There is no rash, tenderness or lesion on  the right labia. There is no rash, tenderness or lesion on the left labia. Uterus is not enlarged and not tender. Right adnexum displays no mass, no tenderness and no fullness. Left adnexum displays  no mass, no tenderness and no fullness.  Lymphadenopathy:  She has no cervical adenopathy.  She has no axillary adenopathy.  Neurological: She is alert. She has normal strength. No cranial nerve deficit or sensory deficit.  Skin: Skin is warm, dry and intact. Flaky 2 cm lesion oin scalp.. ? SK versus squamous cell Ca.Marland Kitchen Refer to derm.  Psychiatric: Her speech is normal and behavior is normal. Judgment normal. Her mood appears not anxious. Cognition and memory are normal. She does not exhibit a depressed mood.  MSK: no erythema or swelling in joints.  LEft knee painis the worst full ROM, some crepitus on exam, neg ant and post drawer, neg Mcmurray's.    The patient's preventative maintenance and recommended screening tests for an annual wellness exam were reviewed in full today.  Brought up to date unless services declined.  Counselled on the importance of diet, exercise, and its role in overall health and mortality.  The patient's FH and SH was reviewed, including their home life, tobacco status, and drug and alcohol status.   Vaccines: Due for shingles. Uptodate with tdap. Colon:Not sure when last colonoscopy done, likely over 10 years but was nml. She has opted for iFOB yearly.  DEXA: No family history of osteoporosis, got menses average, menopause age 6, no steroid use. She is moderate risk so we will do DEXA now.  Mammo: Due, personal history of breast cancer.  Pap/DVE: nml pap in 12/2012, no history of abnormal paps, on q3 year schedule, DVE yearly Nonsmoker.

## 2014-01-16 NOTE — Assessment & Plan Note (Signed)
Start red yeast rice. Recheck in 3 months.

## 2014-01-16 NOTE — Assessment & Plan Note (Signed)
stable °

## 2014-01-16 NOTE — Assessment & Plan Note (Signed)
Recommend derm eval.

## 2014-01-16 NOTE — Assessment & Plan Note (Signed)
Eval with labs. 

## 2014-01-16 NOTE — Progress Notes (Signed)
Pre visit review using our clinic review tool, if applicable. No additional management support is needed unless otherwise documented below in the visit note. 

## 2014-01-16 NOTE — Patient Instructions (Addendum)
Work on low sugar and carbohydrate diet as well as avoiding fast foods.. Work on increasing exercise as able. Start red yeast rice 1200 mg twice daily. Follow up in 3 months for labs only cholesterol check. Look into shingles vaccines coverage. Stop at lab on way out to pick up stool test and labs.  Call to schedule mammogram and bone density on your own.

## 2014-01-17 LAB — CBC WITH DIFFERENTIAL/PLATELET
BASOS ABS: 0 10*3/uL (ref 0.0–0.1)
Basophils Relative: 0.3 % (ref 0.0–3.0)
Eosinophils Absolute: 0.1 10*3/uL (ref 0.0–0.7)
Eosinophils Relative: 0.8 % (ref 0.0–5.0)
HEMATOCRIT: 36.5 % (ref 36.0–46.0)
Hemoglobin: 12 g/dL (ref 12.0–15.0)
LYMPHS ABS: 1.5 10*3/uL (ref 0.7–4.0)
Lymphocytes Relative: 18.4 % (ref 12.0–46.0)
MCHC: 33 g/dL (ref 30.0–36.0)
MCV: 84.3 fl (ref 78.0–100.0)
MONO ABS: 0.1 10*3/uL (ref 0.1–1.0)
Monocytes Relative: 1.3 % — ABNORMAL LOW (ref 3.0–12.0)
NEUTROS PCT: 79.2 % — AB (ref 43.0–77.0)
Neutro Abs: 6.7 10*3/uL (ref 1.4–7.7)
Platelets: 285 10*3/uL (ref 150.0–400.0)
RBC: 4.32 Mil/uL (ref 3.87–5.11)
RDW: 14.3 % (ref 11.5–14.6)
WBC: 8.4 10*3/uL (ref 4.5–10.5)

## 2014-01-17 LAB — ANA: ANA: NEGATIVE

## 2014-01-17 LAB — VITAMIN D 25 HYDROXY (VIT D DEFICIENCY, FRACTURES): VIT D 25 HYDROXY: 21 ng/mL — AB (ref 30–89)

## 2014-01-17 LAB — CYCLIC CITRUL PEPTIDE ANTIBODY, IGG: Cyclic Citrullin Peptide Ab: <2 U/mL (ref 0.0–5.0)

## 2014-01-17 LAB — SEDIMENTATION RATE: Sed Rate: 30 mm/hr — ABNORMAL HIGH (ref 0–22)

## 2014-01-17 LAB — TSH: TSH: 0.55 u[IU]/mL (ref 0.35–5.50)

## 2014-01-17 LAB — VITAMIN B12: VITAMIN B 12: 145 pg/mL — AB (ref 211–911)

## 2014-01-18 ENCOUNTER — Telehealth: Payer: Self-pay | Admitting: Family Medicine

## 2014-01-18 MED ORDER — ERGOCALCIFEROL 1.25 MG (50000 UT) PO CAPS: 50000.0000 [IU] | ORAL_CAPSULE | ORAL | Status: DC

## 2014-01-18 NOTE — Telephone Encounter (Signed)
Notify pt B12 and vit D are both very low. Likely causing fatigue. HAve her come in for b12 injection x 1 then have her start B12 OTC 1000 mcg daily. I will send in rx for Vit D weekly x 12 weeks.  No sign of autoimmune disease, but if pain not improving will refer to rheum.

## 2014-01-18 NOTE — Telephone Encounter (Signed)
Left message for patient to return my call.

## 2014-01-19 ENCOUNTER — Ambulatory Visit (INDEPENDENT_AMBULATORY_CARE_PROVIDER_SITE_OTHER): Payer: BC Managed Care – PPO | Admitting: *Deleted

## 2014-01-19 DIAGNOSIS — E538 Deficiency of other specified B group vitamins: Secondary | ICD-10-CM

## 2014-01-19 MED ORDER — CYANOCOBALAMIN 1000 MCG/ML IJ SOLN
1000.0000 ug | Freq: Once | INTRAMUSCULAR | Status: AC
  Administered 2014-01-19: 1000 ug via INTRAMUSCULAR

## 2014-01-19 NOTE — Telephone Encounter (Signed)
Beth Fox notified as instructed by telephone.  Will come in today for B12 injection.

## 2014-02-22 ENCOUNTER — Other Ambulatory Visit: Payer: Self-pay | Admitting: Family Medicine

## 2014-02-22 NOTE — Telephone Encounter (Signed)
Last office visit 01/16/2014.  Ok to refill? 

## 2014-04-17 ENCOUNTER — Telehealth: Payer: Self-pay | Admitting: Family Medicine

## 2014-04-17 ENCOUNTER — Other Ambulatory Visit (INDEPENDENT_AMBULATORY_CARE_PROVIDER_SITE_OTHER): Payer: BC Managed Care – PPO

## 2014-04-17 DIAGNOSIS — E7800 Pure hypercholesterolemia, unspecified: Secondary | ICD-10-CM

## 2014-04-17 LAB — LIPID PANEL
CHOL/HDL RATIO: 4
Cholesterol: 196 mg/dL (ref 0–200)
HDL: 48.1 mg/dL (ref >39.00)
LDL Cholesterol: 128 mg/dL — ABNORMAL HIGH (ref 0–99)
NonHDL: 147.9
Triglycerides: 102 mg/dL (ref 0.0–149.0)
VLDL: 20.4 mg/dL (ref 0.0–40.0)

## 2014-04-17 NOTE — Telephone Encounter (Signed)
Let pt know LDL chol now improved with red yeast rice. Continue great work!

## 2014-04-17 NOTE — Telephone Encounter (Signed)
Message copied by Jinny Sanders on Tue Apr 17, 2014  8:36 AM ------      Message from: Ellamae Sia      Created: Tue Apr 10, 2014 10:19 AM      Regarding: Lab orders for Tuesday, 7.7.15       Lab orders, no f/u appt ------

## 2014-04-17 NOTE — Telephone Encounter (Signed)
Caitlan notified as instructed by telephone.

## 2014-05-01 ENCOUNTER — Other Ambulatory Visit: Payer: Self-pay | Admitting: Family Medicine

## 2014-05-28 ENCOUNTER — Other Ambulatory Visit: Payer: Self-pay | Admitting: Family Medicine

## 2014-05-28 NOTE — Telephone Encounter (Signed)
Pt had a cpx in 4/15, no future appts. OK to fill?

## 2014-06-11 ENCOUNTER — Other Ambulatory Visit: Payer: Self-pay | Admitting: Family Medicine

## 2014-08-07 ENCOUNTER — Other Ambulatory Visit: Payer: Self-pay | Admitting: Family Medicine

## 2014-09-14 ENCOUNTER — Other Ambulatory Visit: Payer: Self-pay | Admitting: Family Medicine

## 2014-09-14 NOTE — Telephone Encounter (Signed)
Last office visit 01/16/2014.  Last refilled 05/29/2014 for #30 with no refills. Ok to refill?

## 2014-10-11 ENCOUNTER — Other Ambulatory Visit: Payer: Self-pay | Admitting: Family Medicine

## 2014-10-30 ENCOUNTER — Telehealth: Payer: Self-pay

## 2014-10-30 MED ORDER — ROPINIROLE HCL 1 MG PO TABS: 2.0000 mg | ORAL_TABLET | Freq: Two times a day (BID) | ORAL | Status: DC

## 2014-10-30 NOTE — Telephone Encounter (Signed)
RX sent in

## 2014-10-30 NOTE — Telephone Encounter (Signed)
Ms. Amaral notified prescription with new instructions has been sent to her pharmacy.

## 2014-10-30 NOTE — Telephone Encounter (Signed)
Pt left v/m; pt has been taking ropinirole 1 mg taking 2 tabs bid due to restless leg problem worsening. Pt needs refill but cannot refill because too early to refill due to instructions 1 tab bid. Pt wants new rx sent to [facility].

## 2014-11-15 ENCOUNTER — Other Ambulatory Visit: Payer: Self-pay | Admitting: Family Medicine

## 2014-11-15 NOTE — Telephone Encounter (Signed)
Last office visit 01/16/2014.  Last refilled 09/14/2014 for #30 with no refills.  Ok to refill?

## 2014-11-27 ENCOUNTER — Encounter: Payer: Self-pay | Admitting: Family Medicine

## 2014-11-27 ENCOUNTER — Ambulatory Visit (INDEPENDENT_AMBULATORY_CARE_PROVIDER_SITE_OTHER): Payer: BLUE CROSS/BLUE SHIELD | Admitting: Family Medicine

## 2014-11-27 VITALS — BP 124/84 | HR 85 | Temp 98.2°F | Ht 63.75 in | Wt 172.5 lb

## 2014-11-27 DIAGNOSIS — G43009 Migraine without aura, not intractable, without status migrainosus: Secondary | ICD-10-CM

## 2014-11-27 DIAGNOSIS — R0981 Nasal congestion: Secondary | ICD-10-CM | POA: Insufficient documentation

## 2014-11-27 DIAGNOSIS — Z23 Encounter for immunization: Secondary | ICD-10-CM

## 2014-11-27 MED ORDER — KETOPROFEN 75 MG PO CAPS: 75.0000 mg | ORAL_CAPSULE | Freq: Three times a day (TID) | ORAL | Status: DC | PRN

## 2014-11-27 NOTE — Assessment & Plan Note (Signed)
?   Allergies.  Start zyrtec and flonase, continue mucinex and nasal saline

## 2014-11-27 NOTE — Patient Instructions (Signed)
Start zyrtec at bedtime. Add during the day 2 sprays of flonase. Continue mucinex and nasal saline  Spray 2-3 times a day. If not improving in 4-5 days, call to consider antibitoics.  Call sooner if fever or face pain.

## 2014-11-27 NOTE — Progress Notes (Signed)
Pre visit review using our clinic review tool, if applicable. No additional management support is needed unless otherwise documented below in the visit note. 

## 2014-11-27 NOTE — Addendum Note (Signed)
Addended by: Carter Kitten on: 11/27/2014 11:22 AM   Modules accepted: Orders

## 2014-11-27 NOTE — Progress Notes (Signed)
   Subjective:    Patient ID: Beth Fox, female    DOB: 15-Oct-1950, 64 y.o.   MRN: [id number]  Sinus Problem This is a new problem. The current episode started 1 to 4 weeks ago. The problem has been gradually worsening since onset. There has been no fever. She is experiencing no pain. Associated symptoms include congestion, coughing, headaches, sinus pressure and sneezing. Pertinent negatives include no chills, diaphoresis, ear pain, hoarse voice, shortness of breath, sore throat or swollen glands. (Pressure behind eyes  thick brownish green nasal discharge.) Treatments tried: mucinex, saline spray. The treatment provided mild relief.   BP Readings from Last 3 Encounters:  11/27/14 124/84  01/16/14 108/68  09/12/13 122/80    Uses ketoprofen for migraine.. < 1-2 month. Needs refill.   Review of Systems  Constitutional: Negative for chills and diaphoresis.  HENT: Positive for congestion, sinus pressure and sneezing. Negative for ear pain, hoarse voice and sore throat.   Respiratory: Positive for cough. Negative for shortness of breath.   Neurological: Positive for headaches.       Objective:   Physical Exam  Constitutional: Vital signs are normal. She appears well-developed and well-nourished. She is cooperative.  Non-toxic appearance. She does not appear ill. No distress.  HENT:  Head: Normocephalic.  Right Ear: Hearing, tympanic membrane, external ear and ear canal normal. Tympanic membrane is not erythematous, not retracted and not bulging.  Left Ear: Hearing, tympanic membrane, external ear and ear canal normal. Tympanic membrane is not erythematous, not retracted and not bulging.  Nose: Mucosal edema and rhinorrhea present. Right sinus exhibits no maxillary sinus tenderness and no frontal sinus tenderness. Left sinus exhibits no maxillary sinus tenderness and no frontal sinus tenderness.  Mouth/Throat: Uvula is midline, oropharynx is clear and moist and mucous membranes  are normal.  Eyes: Conjunctivae, EOM and lids are normal. Pupils are equal, round, and reactive to light. Lids are everted and swept, no foreign bodies found.  Neck: Trachea normal and normal range of motion. Neck supple. Carotid bruit is not present. No thyroid mass and no thyromegaly present.  Cardiovascular: Normal rate, regular rhythm, S1 normal, S2 normal, normal heart sounds, intact distal pulses and normal pulses.  Exam reveals no gallop and no friction rub.   No murmur heard. Pulmonary/Chest: Effort normal and breath sounds normal. No tachypnea. No respiratory distress. She has no decreased breath sounds. She has no wheezes. She has no rhonchi. She has no rales.  Neurological: She is alert.  Skin: Skin is warm, dry and intact. No rash noted.  Psychiatric: Her speech is normal and behavior is normal. Judgment normal. Her mood appears not anxious. Cognition and memory are normal. She does not exhibit a depressed mood.          Assessment & Plan:

## 2014-11-27 NOTE — Assessment & Plan Note (Signed)
Refill ketoprophen.

## 2015-01-04 ENCOUNTER — Other Ambulatory Visit: Payer: Self-pay | Admitting: Family Medicine

## 2015-03-07 ENCOUNTER — Other Ambulatory Visit: Payer: Self-pay | Admitting: Family Medicine

## 2015-03-07 NOTE — Telephone Encounter (Signed)
Last office visit 11/27/2014.  Last refilled 11/16/2014 for #30 with no refills.  Ok to refill?

## 2015-03-14 ENCOUNTER — Other Ambulatory Visit: Payer: Self-pay | Admitting: Family Medicine

## 2015-03-14 DIAGNOSIS — Z1231 Encounter for screening mammogram for malignant neoplasm of breast: Secondary | ICD-10-CM

## 2015-03-22 ENCOUNTER — Ambulatory Visit
Admission: RE | Admit: 2015-03-22 | Discharge: 2015-03-22 | Disposition: A | Discharge status: Home / Self Care | Payer: BLUE CROSS/BLUE SHIELD | Source: Ambulatory Visit | Attending: Family Medicine | Admitting: Family Medicine

## 2015-03-22 DIAGNOSIS — Z1231 Encounter for screening mammogram for malignant neoplasm of breast: Secondary | ICD-10-CM | POA: Diagnosis not present

## 2015-04-07 ENCOUNTER — Other Ambulatory Visit: Payer: Self-pay | Admitting: Family Medicine

## 2015-04-08 ENCOUNTER — Encounter: Payer: Self-pay | Admitting: Family Medicine

## 2015-04-08 NOTE — Telephone Encounter (Signed)
Please call and schedule CPE with fasting labs with Dr. Diona Browner.

## 2015-04-08 NOTE — Telephone Encounter (Signed)
Mailed letter asking pt to call to reschedule.

## 2015-04-08 NOTE — Telephone Encounter (Signed)
Left message on pt's answwering machine to call to schedule CPE appt with fasting labs prior / lt

## 2015-04-23 ENCOUNTER — Encounter: Payer: Self-pay | Admitting: Family Medicine

## 2015-04-23 ENCOUNTER — Ambulatory Visit (INDEPENDENT_AMBULATORY_CARE_PROVIDER_SITE_OTHER): Payer: BLUE CROSS/BLUE SHIELD | Admitting: Family Medicine

## 2015-04-23 VITALS — BP 116/74 | HR 74 | Temp 98.4°F | Ht 63.75 in | Wt 178.8 lb

## 2015-04-23 DIAGNOSIS — M62830 Muscle spasm of back: Secondary | ICD-10-CM

## 2015-04-23 MED ORDER — DICLOFENAC SODIUM 75 MG PO TBEC: 75.0000 mg | DELAYED_RELEASE_TABLET | Freq: Two times a day (BID) | ORAL | Status: DC

## 2015-04-23 MED ORDER — CYCLOBENZAPRINE HCL 10 MG PO TABS: 10.0000 mg | ORAL_TABLET | Freq: Every evening | ORAL | Status: DC | PRN

## 2015-04-23 NOTE — Assessment & Plan Note (Signed)
Treat with NSAIDs, heat, exercises and info give. Start muscle relaxant at night prn.

## 2015-04-23 NOTE — Patient Instructions (Signed)
Start flexeril for muscle spasm at night. Stop meloxicam. Start diclofenac twice daily for inflammation and pain. Start home exercise and heat on low back. Follow up in 2 weeks if not improving.

## 2015-04-23 NOTE — Progress Notes (Signed)
Pre visit review using our clinic review tool, if applicable. No additional management support is needed unless otherwise documented below in the visit note. 

## 2015-04-23 NOTE — Progress Notes (Signed)
Subjective:    Patient ID: Beth Fox, female    DOB: Feb 21, 1951, 64 y.o.   MRN: [id number]  HPI   64 year old female presents with new onset pain in low back in last 3 days. She reports she has been dong more lifting and reaching  In last few weeks at work.  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 No known injury, no known fall.   Pain is across low back, more on left side. Radiates to left hip when she walks.  Gets stiff after sitting a while.  Improved with lying down.  No numbness, no tingling.  No weakness.  No incontinence, no fever.  Tried meloxicam.. No relief. Took some oxycodone of husbands.. Minimal relief.  Social History /Family History/Past Medical History reviewed and updated if needed. HX of chronic neck pain Off and on back pain in past 10 years if she over does it.  Saw chiropractor in past.    Review of Systems  Constitutional: Negative for fever and fatigue.  HENT: Negative for ear pain.   Eyes: Negative for pain.  Respiratory: Negative for chest tightness and shortness of breath.   Cardiovascular: Negative for chest pain, palpitations and leg swelling.  Gastrointestinal: Negative for abdominal pain.  Genitourinary: Negative for dysuria.       Objective:   Physical Exam  Constitutional: Vital signs are normal. She appears well-developed and well-nourished. She is cooperative.  Non-toxic appearance. She does not appear ill. No distress.  HENT:  Head: Normocephalic.  Right Ear: Hearing, tympanic membrane, external ear and ear canal normal. Tympanic membrane is not erythematous, not retracted and not bulging.  Left Ear: Hearing, tympanic membrane, external ear and ear canal normal. Tympanic membrane is not erythematous, not retracted and not bulging.  Nose: No mucosal edema or rhinorrhea. Right sinus exhibits no maxillary sinus tenderness and no frontal sinus tenderness. Left sinus exhibits no maxillary sinus tenderness and no  frontal sinus tenderness.  Mouth/Throat: Uvula is midline, oropharynx is clear and moist and mucous membranes are normal.  Eyes: Conjunctivae, EOM and lids are normal. Pupils are equal, round, and reactive to light. Lids are everted and swept, no foreign bodies found.  Neck: Trachea normal and normal range of motion. Neck supple. Carotid bruit is not present. No thyroid mass and no thyromegaly present.  Cardiovascular: Normal rate, regular rhythm, S1 normal, S2 normal, normal heart sounds, intact distal pulses and normal pulses.  Exam reveals no gallop and no friction rub.   No murmur heard. Pulmonary/Chest: Effort normal and breath sounds normal. No tachypnea. No respiratory distress. She has no decreased breath sounds. She has no wheezes. She has no rhonchi. She has no rales.  Abdominal: Soft. Normal appearance and bowel sounds are normal. There is no tenderness.  Musculoskeletal:       Thoracic back: Normal. She exhibits normal range of motion.       Lumbar back: She exhibits decreased range of motion and tenderness. She exhibits no bony tenderness, no swelling and no deformity.  Decreased back extension, good flexion  Neg faber's, neg SLR  ttp focally over left paraspinous muscle, less so on right  Neurological: She is alert.  Skin: Skin is warm, dry and intact. No rash noted.  Psychiatric: Her speech is normal and behavior is normal. Judgment and thought content normal. Her mood appears not anxious. Cognition and memory are normal. She does not exhibit a depressed mood.  Assessment & Plan:

## 2015-04-29 ENCOUNTER — Other Ambulatory Visit: Payer: Self-pay | Admitting: Family Medicine

## 2015-07-15 ENCOUNTER — Other Ambulatory Visit: Payer: Self-pay | Admitting: Family Medicine

## 2015-07-26 ENCOUNTER — Telehealth: Payer: Self-pay | Admitting: Family Medicine

## 2015-07-26 ENCOUNTER — Other Ambulatory Visit (INDEPENDENT_AMBULATORY_CARE_PROVIDER_SITE_OTHER): Payer: BLUE CROSS/BLUE SHIELD

## 2015-07-26 DIAGNOSIS — R7309 Other abnormal glucose: Secondary | ICD-10-CM

## 2015-07-26 DIAGNOSIS — E7800 Pure hypercholesterolemia, unspecified: Secondary | ICD-10-CM

## 2015-07-26 LAB — COMPREHENSIVE METABOLIC PANEL
ALBUMIN: 4 g/dL (ref 3.5–5.2)
ALT: 19 U/L (ref 0–35)
AST: 19 U/L (ref 0–37)
Alkaline Phosphatase: 91 U/L (ref 39–117)
BILIRUBIN TOTAL: 0.5 mg/dL (ref 0.2–1.2)
BUN: 17 mg/dL (ref 6–23)
CALCIUM: 9.5 mg/dL (ref 8.4–10.5)
CO2: 29 mEq/L (ref 19–32)
CREATININE: 0.95 mg/dL (ref 0.40–1.20)
Chloride: 105 mEq/L (ref 96–112)
GFR: 63.08 mL/min (ref >60.00)
Glucose, Bld: 98 mg/dL (ref 70–99)
Potassium: 3.7 mEq/L (ref 3.5–5.1)
SODIUM: 140 meq/L (ref 135–145)
TOTAL PROTEIN: 7.1 g/dL (ref 6.0–8.3)

## 2015-07-26 LAB — HEMOGLOBIN A1C: Hgb A1c MFr Bld: 6.1 % (ref 4.6–6.5)

## 2015-07-26 LAB — LIPID PANEL
CHOLESTEROL: 198 mg/dL (ref 0–200)
HDL: 42.9 mg/dL (ref >39.00)
LDL Cholesterol: 132 mg/dL — ABNORMAL HIGH (ref 0–99)
NonHDL: 154.78
Total CHOL/HDL Ratio: 5
Triglycerides: 114 mg/dL (ref 0.0–149.0)
VLDL: 22.8 mg/dL (ref 0.0–40.0)

## 2015-07-26 NOTE — Telephone Encounter (Signed)
-----   Message from Ellamae Sia sent at 07/17/2015  5:54 PM EDT ----- Regarding: Lab orders for Friday,10.14.16 Patient is scheduled for CPX labs, please order future labs, Thanks , Karna Christmas

## 2015-08-02 ENCOUNTER — Encounter: Payer: Self-pay | Admitting: Family Medicine

## 2015-08-02 ENCOUNTER — Other Ambulatory Visit: Payer: Self-pay | Admitting: Family Medicine

## 2015-08-02 ENCOUNTER — Ambulatory Visit (INDEPENDENT_AMBULATORY_CARE_PROVIDER_SITE_OTHER): Payer: BLUE CROSS/BLUE SHIELD | Admitting: Family Medicine

## 2015-08-02 VITALS — BP 128/70 | HR 69 | Temp 99.2°F | Ht 64.5 in | Wt 174.5 lb

## 2015-08-02 DIAGNOSIS — E7800 Pure hypercholesterolemia, unspecified: Secondary | ICD-10-CM

## 2015-08-02 DIAGNOSIS — E348 Other specified endocrine disorders: Secondary | ICD-10-CM | POA: Diagnosis not present

## 2015-08-02 DIAGNOSIS — R7309 Other abnormal glucose: Secondary | ICD-10-CM

## 2015-08-02 DIAGNOSIS — Z23 Encounter for immunization: Secondary | ICD-10-CM

## 2015-08-02 DIAGNOSIS — G8929 Other chronic pain: Secondary | ICD-10-CM

## 2015-08-02 DIAGNOSIS — M2550 Pain in unspecified joint: Secondary | ICD-10-CM

## 2015-08-02 DIAGNOSIS — I10 Essential (primary) hypertension: Secondary | ICD-10-CM

## 2015-08-02 DIAGNOSIS — Z1159 Encounter for screening for other viral diseases: Secondary | ICD-10-CM | POA: Diagnosis not present

## 2015-08-02 DIAGNOSIS — Z0000 Encounter for general adult medical examination without abnormal findings: Secondary | ICD-10-CM

## 2015-08-02 DIAGNOSIS — M542 Cervicalgia: Secondary | ICD-10-CM

## 2015-08-02 MED ORDER — DICLOFENAC SODIUM 75 MG PO TBEC: 75.0000 mg | DELAYED_RELEASE_TABLET | Freq: Two times a day (BID) | ORAL | Status: DC

## 2015-08-02 MED ORDER — CYCLOBENZAPRINE HCL 10 MG PO TABS: 10.0000 mg | ORAL_TABLET | Freq: Every evening | ORAL | Status: DC | PRN

## 2015-08-02 NOTE — Assessment & Plan Note (Signed)
Refill diclofenac and muscle relaxant.

## 2015-08-02 NOTE — Progress Notes (Signed)
Pre visit review using our clinic review tool, if applicable. No additional management support is needed unless otherwise documented below in the visit note. 

## 2015-08-02 NOTE — Assessment & Plan Note (Signed)
Well controlled. Continue current medication.  

## 2015-08-02 NOTE — Progress Notes (Signed)
64 year old female, the patient is here for annual wellness exam and preventative care.   Has history of Right tram flap and reduction in left breast, last 06/2010. No complications.  Has history of right breast CA 14 years ago.   She is having right elbow pain and right hip pain, right neck and shoulder pain, right hand pain.  Hypertension: Well controlled on HCTZ.  BP Readings from Last 3 Encounters:  08/02/15 128/70  04/23/15 116/74  11/27/14 124/84  Using medication without problems or lightheadedness: None  Chest pain with exertion:None  Edema:mild occ Short of breath:none Average home BPs:Not checking  Other issues:   Prediabetes: stable Lab Results  Component Value Date   HGBA1C 6.1 07/26/2015    Elevated Cholesterol:  Almost at goal LDL < 130, several years ago she was on statin had mild SE. Using red yeast rice. Lab Results  Component Value Date   CHOL 198 07/26/2015   HDL 42.90 07/26/2015   LDLCALC 132* 07/26/2015   LDLDIRECT 167.7 09/01/2013   TRIG 114.0 07/26/2015   CHOLHDL 5 07/26/2015   Diet compliance: improved, nighttime snacking. Exercise:  Other complaints:   Depression, major: Stable on sertraline 50 mg. She feels much better, she feels mood is where it needs to be. No SI, no HI.  Sleeping moderately well.   Review of Systems  Constitutional: Negative for fever, fatigue and unexpected weight change.  HENT: Negative for ear pain, congestion, sore throat, sneezing, trouble swallowing and sinus pressure.  Eyes: Negative for pain and itching.  Respiratory: Negative for cough, shortness of breath and wheezing.  Cardiovascular: Negative for chest pain, palpitations and leg swelling.  Gastrointestinal: Negative for nausea, abdominal pain, diarrhea, constipation and blood in stool.  Genitourinary: Negative for dysuria, hematuria, vaginal discharge, difficulty urinating and menstrual problem.  Skin: Negative for rash.  Neurological:  Negative for syncope, weakness, light-headedness, numbness and headaches.  Psychiatric/Behavioral: Negative for confusion and dysphoric mood. The patient is not nervous/anxious.  Objective:   Physical Exam  Constitutional: Vital signs are normal. She appears well-developed and well-nourished. She is cooperative. Non-toxic appearance. She does not appear ill. No distress.  HENT:  Head: Normocephalic.  Right Ear: Hearing, tympanic membrane, external ear and ear canal normal.  Left Ear: Hearing, tympanic membrane, external ear and ear canal normal.  Nose: Nose normal.  Eyes: Conjunctivae, EOM and lids are normal. Pupils are equal, round, and reactive to light. No foreign bodies found.  Neck: Trachea normal and normal range of motion. Neck supple. Carotid bruit is not present. No mass and no thyromegaly present.  Cardiovascular: Normal rate, regular rhythm, S1 normal, S2 normal, normal heart sounds and intact distal pulses. Exam reveals no gallop.  No murmur heard.  Pulmonary/Chest: Effort normal and breath sounds normal. No respiratory distress. She has no wheezes. She has no rhonchi. She has no rales.  Abdominal: Soft. Normal appearance and bowel sounds are normal. She exhibits no distension, no fluid wave, no abdominal bruit and no mass. There is no hepatosplenomegaly. There is no tenderness. There is no rebound, no guarding and no CVA tenderness. No hernia.  Genitourinary: Vagina normal and uterus normal. No breast swelling, tenderness, discharge or bleeding. Pelvic exam was performed with patient prone. There is no rash, tenderness or lesion on the right labia. There is no rash, tenderness or lesion on the left labia. Uterus is not enlarged and not tender. Right adnexum displays no mass, no tenderness and no fullness. Left adnexum displays no mass,  no tenderness and no fullness.  Lymphadenopathy:  She has no cervical adenopathy.  She has no axillary adenopathy.  Neurological: She is  alert. She has normal strength. No cranial nerve deficit or sensory deficit.  Skin: Skin is warm, dry and intact.Psychiatric: Her speech is normal and behavior is normal. Judgment normal. Her mood appears not anxious. Cognition and memory are normal. She does not exhibit a depressed mood.  MSK: no erythema or swelling in joints.     The patient's preventative maintenance and recommended screening tests for an annual wellness exam were reviewed in full today.  Brought up to date unless services declined.  Counselled on the importance of diet, exercise, and its role in overall health and mortality.  The patient's FH and SH was reviewed, including their home life, tobacco status, and drug and alcohol status.   Vaccines: Refused shingles. Uptodate with tdap, flu Colon:Not sure when last colonoscopy done, likely over 10 years but was nml. She has opted for iFOB yearly.  DEXA: No family history of osteoporosis, got menses average, menopause age 50, no steroid use. She is moderate risk so we will do DEXA now.  Mammo:stable 03/2015 , personal history of breast cancer.  Pap/DVE: nml pap in 12/2012, no history of abnormal paps, on q3 year schedule, DVE yearly Nonsmoker.  HEP C:  Will check today

## 2015-08-02 NOTE — Assessment & Plan Note (Signed)
Almost at goal LDL < 130 Using red yeast rice., several years ago she was on statin had mild SE.

## 2015-08-02 NOTE — Assessment & Plan Note (Signed)
Treat with NSAID. Return for further eval if not improving

## 2015-08-02 NOTE — Assessment & Plan Note (Signed)
Stable control.  Work on low carb diet and exercise. 

## 2015-08-02 NOTE — Patient Instructions (Addendum)
Look into shingles vaccine coverage. Stop at front desk Bone density.  Stop at lab on way out.  If pain in joints not improving .Marland Kitchen Make appt to be seen for this.

## 2015-08-03 LAB — HEPATITIS C ANTIBODY: HCV Ab: NEGATIVE

## 2015-08-06 ENCOUNTER — Encounter: Payer: Self-pay | Admitting: *Deleted

## 2015-08-21 ENCOUNTER — Encounter: Payer: Self-pay | Admitting: Family Medicine

## 2015-08-21 ENCOUNTER — Ambulatory Visit
Admission: RE | Admit: 2015-08-21 | Discharge: 2015-08-21 | Disposition: A | Discharge status: Home / Self Care | Payer: BLUE CROSS/BLUE SHIELD | Source: Ambulatory Visit | Attending: Family Medicine | Admitting: Family Medicine

## 2015-08-21 DIAGNOSIS — M8580 Other specified disorders of bone density and structure, unspecified site: Secondary | ICD-10-CM | POA: Insufficient documentation

## 2015-08-21 DIAGNOSIS — Z780 Asymptomatic menopausal state: Secondary | ICD-10-CM | POA: Diagnosis not present

## 2015-08-21 DIAGNOSIS — E348 Other specified endocrine disorders: Secondary | ICD-10-CM | POA: Diagnosis not present

## 2015-08-26 ENCOUNTER — Other Ambulatory Visit: Payer: Self-pay | Admitting: Family Medicine

## 2015-09-23 ENCOUNTER — Other Ambulatory Visit: Payer: Self-pay | Admitting: Family Medicine

## 2015-09-24 NOTE — Telephone Encounter (Signed)
Last office visit 08/02/2015.  Last refilled 08/02/2015.  Ok to refill?

## 2015-10-25 ENCOUNTER — Other Ambulatory Visit: Payer: Self-pay | Admitting: Family Medicine

## 2016-01-01 ENCOUNTER — Other Ambulatory Visit: Payer: Self-pay | Admitting: Family Medicine

## 2016-01-01 NOTE — Telephone Encounter (Signed)
Last office visit 08/02/2015.  Last refilled 09/24/2015 for #30 with no refills.  Ok to refill?

## 2016-02-25 ENCOUNTER — Other Ambulatory Visit: Payer: Self-pay | Admitting: Family Medicine

## 2016-02-25 NOTE — Telephone Encounter (Signed)
Last office visit 08/02/2015.  Last refilled 09/24/2015 for #30 with no refills.  Ok to refill?

## 2016-03-08 ENCOUNTER — Other Ambulatory Visit: Payer: Self-pay | Admitting: Family Medicine

## 2016-04-21 ENCOUNTER — Other Ambulatory Visit: Payer: Self-pay | Admitting: *Deleted

## 2016-04-21 MED ORDER — ROPINIROLE HCL 1 MG PO TABS: ORAL_TABLET | ORAL | Status: DC

## 2016-05-16 ENCOUNTER — Other Ambulatory Visit: Payer: Self-pay | Admitting: Family Medicine

## 2016-05-18 ENCOUNTER — Other Ambulatory Visit: Payer: Self-pay | Admitting: Family Medicine

## 2016-05-18 NOTE — Telephone Encounter (Signed)
Received refill request electronically Last refill 04/21/16 #120 Last office visit 08/02/15 Okay to refill?

## 2016-05-29 ENCOUNTER — Ambulatory Visit (INDEPENDENT_AMBULATORY_CARE_PROVIDER_SITE_OTHER): Payer: BLUE CROSS/BLUE SHIELD | Admitting: Family Medicine

## 2016-06-02 ENCOUNTER — Encounter: Payer: Self-pay | Admitting: Family Medicine

## 2016-06-02 ENCOUNTER — Ambulatory Visit (INDEPENDENT_AMBULATORY_CARE_PROVIDER_SITE_OTHER): Payer: BLUE CROSS/BLUE SHIELD | Admitting: Family Medicine

## 2016-06-02 VITALS — BP 130/80 | HR 81 | Temp 98.3°F | Ht 64.5 in | Wt 153.8 lb

## 2016-06-02 DIAGNOSIS — G2581 Restless legs syndrome: Secondary | ICD-10-CM

## 2016-06-02 LAB — TSH: TSH: 0.63 u[IU]/mL (ref 0.35–4.50)

## 2016-06-02 LAB — CBC WITH DIFFERENTIAL/PLATELET
Basophils Absolute: 0 10*3/uL (ref 0.0–0.1)
Basophils Relative: 0.7 % (ref 0.0–3.0)
Eosinophils Absolute: 0.1 10*3/uL (ref 0.0–0.7)
Eosinophils Relative: 1.6 % (ref 0.0–5.0)
HCT: 34.7 % — ABNORMAL LOW (ref 36.0–46.0)
Hemoglobin: 11.2 g/dL — ABNORMAL LOW (ref 12.0–15.0)
LYMPHS PCT: 29.6 % (ref 12.0–46.0)
Lymphs Abs: 1.9 10*3/uL (ref 0.7–4.0)
MCHC: 32.1 g/dL (ref 30.0–36.0)
MCV: 74 fl — AB (ref 78.0–100.0)
MONOS PCT: 6 % (ref 3.0–12.0)
Monocytes Absolute: 0.4 10*3/uL (ref 0.1–1.0)
NEUTROS ABS: 4 10*3/uL (ref 1.4–7.7)
Neutrophils Relative %: 62.1 % (ref 43.0–77.0)
PLATELETS: 293 10*3/uL (ref 150.0–400.0)
RBC: 4.7 Mil/uL (ref 3.87–5.11)
RDW: 17.7 % — ABNORMAL HIGH (ref 11.5–15.5)
WBC: 6.5 10*3/uL (ref 4.0–10.5)

## 2016-06-02 LAB — IBC PANEL
Iron: 29 ug/dL — ABNORMAL LOW (ref 42–145)
Saturation Ratios: 5.6 % — ABNORMAL LOW (ref 20.0–50.0)
Transferrin: 369 mg/dL — ABNORMAL HIGH (ref 212.0–360.0)

## 2016-06-02 LAB — VITAMIN B12: Vitamin B-12: 532 pg/mL (ref 211–911)

## 2016-06-02 LAB — FERRITIN: Ferritin: 12 ng/mL (ref 10.0–291.0)

## 2016-06-02 MED ORDER — GABAPENTIN 100 MG PO CAPS: 100.0000 mg | ORAL_CAPSULE | Freq: Three times a day (TID) | ORAL | 3 refills | Status: DC

## 2016-06-02 NOTE — Progress Notes (Signed)
Subjective:    Patient ID: Beth Fox, female    DOB: [birth year], 65 y.o.   MRN: [id number]  HPI  65 year old female presents for follow up on restless leg syndrome.  She is currently using 1 mg 2 tabs BID for symptoms.   She reports that in the last 2 months, she has had gradually worsening RLS. Has improvement in sympoms for 4 hours, then symptoms return. Symptoms are all day and night.   Cannot sleep at night.  She has been more fatigued.  Meds that she is on that can worsen symptoms: antidepressant (sertraline, but she h as been on a while and is unable to stop given great benefit of medication) , caffeine ( uses minimally.   Iron stores should be evaluated in all patients with suspected or established RLS/WED. We suggest a trial of oral iron therapy for patients with iron deficiency or low-normal ferritin levels (ie, serum ferritin level <75 mcg/L) (Grade 2C  She has lost 20 lbs. Low carb diet. Wt Readings from Last 3 Encounters:  06/02/16 153 lb 12 oz (69.7 kg)  08/02/15 174 lb 8 oz (79.2 kg)  04/23/15 178 lb 12 oz (81.1 kg)     Father with Parkinson's and RLS. Review of Systems  Constitutional: Negative for fatigue and fever.  HENT: Negative for ear pain.   Eyes: Negative for pain.  Respiratory: Negative for chest tightness and shortness of breath.   Cardiovascular: Negative for chest pain, palpitations and leg swelling.  Gastrointestinal: Negative for abdominal pain.  Genitourinary: Negative for dysuria.       Objective:   Physical Exam  Constitutional: Vital signs are normal. She appears well-developed and well-nourished. She is cooperative.  Non-toxic appearance. She does not appear ill. No distress.  HENT:  Head: Normocephalic.  Right Ear: Hearing, tympanic membrane, external ear and ear canal normal. Tympanic membrane is not erythematous, not retracted and not bulging.  Left Ear: Hearing, tympanic membrane, external ear and ear canal normal. Tympanic  membrane is not erythematous, not retracted and not bulging.  Nose: No mucosal edema or rhinorrhea. Right sinus exhibits no maxillary sinus tenderness and no frontal sinus tenderness. Left sinus exhibits no maxillary sinus tenderness and no frontal sinus tenderness.  Mouth/Throat: Uvula is midline, oropharynx is clear and moist and mucous membranes are normal.  Eyes: Conjunctivae, EOM and lids are normal. Pupils are equal, round, and reactive to light. Lids are everted and swept, no foreign bodies found.  Neck: Trachea normal and normal range of motion. Neck supple. Carotid bruit is not present. No thyroid mass and no thyromegaly present.  Cardiovascular: Normal rate, regular rhythm, S1 normal, S2 normal, normal heart sounds, intact distal pulses and normal pulses.  Exam reveals no gallop and no friction rub.   No murmur heard. Pulmonary/Chest: Effort normal and breath sounds normal. No tachypnea. No respiratory distress. She has no decreased breath sounds. She has no wheezes. She has no rhonchi. She has no rales.  Abdominal: Soft. Normal appearance and bowel sounds are normal. There is no tenderness.  Neurological: She is alert. She has normal strength. No cranial nerve deficit or sensory deficit. Coordination and gait normal.   Neg cogwheel. No tremor. nml affect. No shuffling.   Skin: Skin is warm, dry and intact. No rash noted.  Psychiatric: Her speech is normal and behavior is normal. Judgment and thought content normal. Her mood appears not anxious. Cognition and memory are normal. She does not exhibit a depressed mood.  Assessment & Plan:

## 2016-06-02 NOTE — Patient Instructions (Addendum)
Decrease or stop caffeine. Stop at lab on way out for iron panel  Stop ropinirole, change to gabapentin 100 mg at bedtime, if tolerating increase over the week to 100 mg three times daily.

## 2016-06-02 NOTE — Assessment & Plan Note (Signed)
Stop triggers like caffeine. Cannot stop antidepressant. Check iron. Eval with other labs given fatigue. Will  Try different med to treat.. Gabapentin. Titrate up. If not improving in 2 week may consider neuro referral.

## 2016-06-02 NOTE — Progress Notes (Signed)
Pre visit review using our clinic review tool, if applicable. No additional management support is needed unless otherwise documented below in the visit note. 

## 2016-06-18 ENCOUNTER — Ambulatory Visit (INDEPENDENT_AMBULATORY_CARE_PROVIDER_SITE_OTHER): Payer: BLUE CROSS/BLUE SHIELD | Admitting: Family Medicine

## 2016-06-18 ENCOUNTER — Encounter: Payer: Self-pay | Admitting: Family Medicine

## 2016-06-18 VITALS — BP 110/70 | HR 70 | Temp 98.5°F | Wt 152.5 lb

## 2016-06-18 DIAGNOSIS — F332 Major depressive disorder, recurrent severe without psychotic features: Secondary | ICD-10-CM | POA: Diagnosis not present

## 2016-06-18 DIAGNOSIS — G2581 Restless legs syndrome: Secondary | ICD-10-CM

## 2016-06-18 MED ORDER — SERTRALINE HCL 100 MG PO TABS: 100.0000 mg | ORAL_TABLET | Freq: Every day | ORAL | 3 refills | Status: DC

## 2016-06-18 NOTE — Assessment & Plan Note (Signed)
Improved on gabapentin and iron.. ? If causing to much fatogue may try to decrease dose as iron may be helping most.

## 2016-06-18 NOTE — Progress Notes (Signed)
Pre visit review using our clinic review tool, if applicable. No additional management support is needed unless otherwise documented below in the visit note. 

## 2016-06-18 NOTE — Progress Notes (Signed)
Subjective:    Patient ID: Beth Fox, female    DOB: 1951-05-31, 65 y.o.   MRN: [id number]  HPI    65 year old female pt with RLS presents for 2 week follow up after changing from requip high dose ( no longer effective) to gabapentin 100 mg at bedtime, with possible increase.  She was also found to be iron def.. Started on ferrous sulfate.  Today she reports she was able to increase gabapentin to 3 a day. She reports no sedation with this medication.  Started daily iron with stool softners.. No constipation.  She has stopped caffeine. She reports her symptoms are 75% , both at night and in day. She is not sleeping throughout the night given leg cramps.. She felt she was dehydrated so has been trying to increase liquids.   Able to sleep 3 hours at a time Given gets up 2 times a night to put warm compresses on her legs.  Wakes up tired in AM, more some than previously.   She is having some mood issues. She feels depressed, moderately, no severe as in past but gradually worse in last 3-6 months.. She feels like she is fighting a losing battle at home and work.  Feels to tired to do anything, decreased motivation, anhedonia.  Husband has been sick for last 8 years, not improving.     Review of Systems  Constitutional: Negative for fatigue and fever.  HENT: Negative for ear pain.   Eyes: Negative for pain.  Respiratory: Negative for chest tightness and shortness of breath.   Cardiovascular: Negative for chest pain, palpitations and leg swelling.  Gastrointestinal: Negative for abdominal pain.  Genitourinary: Negative for dysuria.       Objective:   Physical Exam  Constitutional: Vital signs are normal. She appears well-developed and well-nourished. She is cooperative.  Non-toxic appearance. She does not appear ill. No distress.  HENT:  Head: Normocephalic.  Right Ear: Hearing, tympanic membrane, external ear and ear canal normal. Tympanic membrane is not erythematous, not  retracted and not bulging.  Left Ear: Hearing, tympanic membrane, external ear and ear canal normal. Tympanic membrane is not erythematous, not retracted and not bulging.  Nose: No mucosal edema or rhinorrhea. Right sinus exhibits no maxillary sinus tenderness and no frontal sinus tenderness. Left sinus exhibits no maxillary sinus tenderness and no frontal sinus tenderness.  Mouth/Throat: Uvula is midline, oropharynx is clear and moist and mucous membranes are normal.  Eyes: Conjunctivae, EOM and lids are normal. Pupils are equal, round, and reactive to light. Lids are everted and swept, no foreign bodies found.  Neck: Trachea normal and normal range of motion. Neck supple. Carotid bruit is not present. No thyroid mass and no thyromegaly present.  Cardiovascular: Normal rate, regular rhythm, S1 normal, S2 normal, normal heart sounds, intact distal pulses and normal pulses.  Exam reveals no gallop and no friction rub.   No murmur heard. Pulmonary/Chest: Effort normal and breath sounds normal. No tachypnea. No respiratory distress. She has no decreased breath sounds. She has no wheezes. She has no rhonchi. She has no rales.  Abdominal: Soft. Normal appearance and bowel sounds are normal. There is no tenderness.  Neurological: She is alert.  Skin: Skin is warm, dry and intact. No rash noted.  Psychiatric: Her speech is normal and behavior is normal. Judgment and thought content normal. Her mood appears not anxious. Cognition and memory are normal. She does not exhibit a depressed mood.  Assessment & Plan:

## 2016-06-18 NOTE — Assessment & Plan Note (Signed)
Inadequate control on sertraline.  Increase to 100 mg daily. If worsening fatigue can consider other options.

## 2016-06-18 NOTE — Patient Instructions (Signed)
Continue current dose of gabapentin.  Increase sertraline at night to 100 mg daily.

## 2016-07-07 ENCOUNTER — Other Ambulatory Visit: Payer: Self-pay | Admitting: Family Medicine

## 2016-07-07 NOTE — Telephone Encounter (Signed)
Last office visit 06/18/2016.  Last refilled 02/25/2016 for #30 with no refills.

## 2016-07-16 ENCOUNTER — Ambulatory Visit: Payer: BLUE CROSS/BLUE SHIELD | Admitting: Family Medicine

## 2016-07-16 DIAGNOSIS — Z0289 Encounter for other administrative examinations: Secondary | ICD-10-CM

## 2016-10-01 ENCOUNTER — Other Ambulatory Visit: Payer: Self-pay | Admitting: Family Medicine

## 2016-10-01 NOTE — Telephone Encounter (Signed)
Last refill 07/07/16 #30, last OV 06/18/16. Ok to refill?

## 2016-10-10 ENCOUNTER — Other Ambulatory Visit: Payer: Self-pay | Admitting: Family Medicine

## 2016-10-12 NOTE — Telephone Encounter (Signed)
Last office visit 06/18/16 for restless legs and mood.  AVS states to follow up in 4 weeks.  Cancelled appointment on 07/16/16, stating she was feeling better   No future appointments.  Last CPE 08/02/2015  Refill?

## 2016-11-01 ENCOUNTER — Other Ambulatory Visit: Payer: Self-pay | Admitting: Family Medicine

## 2016-11-02 NOTE — Telephone Encounter (Signed)
Last office visit 06/18/2016.  AVS states to follow up in 4 weeks.  No future appointments.  Ok to refill?

## 2016-11-17 ENCOUNTER — Other Ambulatory Visit: Payer: Self-pay | Admitting: Family Medicine

## 2016-11-17 NOTE — Telephone Encounter (Signed)
Last office visit 06/18/2016.  Last refilled 06/02/2016 for #90 with 3 refills.  Ok to refill?

## 2017-01-05 ENCOUNTER — Other Ambulatory Visit: Payer: Self-pay | Admitting: Family Medicine

## 2017-01-05 NOTE — Telephone Encounter (Signed)
Last office visit 06/18/2016.  Last refilled 10/02/2016 for #30 with no refills.  Ok to refill?

## 2017-02-09 ENCOUNTER — Ambulatory Visit (INDEPENDENT_AMBULATORY_CARE_PROVIDER_SITE_OTHER): Payer: BLUE CROSS/BLUE SHIELD | Admitting: Family Medicine

## 2017-02-09 ENCOUNTER — Encounter: Payer: Self-pay | Admitting: Family Medicine

## 2017-02-09 VITALS — BP 110/62 | HR 80 | Temp 98.7°F | Ht 64.5 in | Wt 163.5 lb

## 2017-02-09 DIAGNOSIS — R1013 Epigastric pain: Secondary | ICD-10-CM | POA: Insufficient documentation

## 2017-02-09 LAB — COMPREHENSIVE METABOLIC PANEL
ALBUMIN: 4.4 g/dL (ref 3.5–5.2)
ALK PHOS: 89 U/L (ref 39–117)
ALT: 19 U/L (ref 0–35)
AST: 18 U/L (ref 0–37)
BUN: 23 mg/dL (ref 6–23)
CALCIUM: 10 mg/dL (ref 8.4–10.5)
CO2: 34 meq/L — AB (ref 19–32)
Chloride: 101 mEq/L (ref 96–112)
Creatinine, Ser: 0.92 mg/dL (ref 0.40–1.20)
GFR: 65.14 mL/min (ref >60.00)
Glucose, Bld: 97 mg/dL (ref 70–99)
Potassium: 3.7 mEq/L (ref 3.5–5.1)
Sodium: 140 mEq/L (ref 135–145)
Total Bilirubin: 0.5 mg/dL (ref 0.2–1.2)
Total Protein: 7.7 g/dL (ref 6.0–8.3)

## 2017-02-09 LAB — CBC WITH DIFFERENTIAL/PLATELET
BASOS ABS: 0 10*3/uL (ref 0.0–0.1)
BASOS PCT: 0.7 % (ref 0.0–3.0)
Eosinophils Absolute: 0.1 10*3/uL (ref 0.0–0.7)
Eosinophils Relative: 2 % (ref 0.0–5.0)
HEMATOCRIT: 42.3 % (ref 36.0–46.0)
HEMOGLOBIN: 14.2 g/dL (ref 12.0–15.0)
LYMPHS PCT: 20.5 % (ref 12.0–46.0)
Lymphs Abs: 1.2 10*3/uL (ref 0.7–4.0)
MCHC: 33.5 g/dL (ref 30.0–36.0)
MCV: 91.7 fl (ref 78.0–100.0)
MONOS PCT: 6.8 % (ref 3.0–12.0)
Monocytes Absolute: 0.4 10*3/uL (ref 0.1–1.0)
NEUTROS ABS: 4 10*3/uL (ref 1.4–7.7)
Neutrophils Relative %: 70 % (ref 43.0–77.0)
PLATELETS: 263 10*3/uL (ref 150.0–400.0)
RBC: 4.62 Mil/uL (ref 3.87–5.11)
RDW: 12.9 % (ref 11.5–15.5)
WBC: 5.7 10*3/uL (ref 4.0–10.5)

## 2017-02-09 LAB — LIPASE: LIPASE: 26 U/L (ref 11.0–59.0)

## 2017-02-09 NOTE — Assessment & Plan Note (Signed)
Likely gastritis vs PUD from NSAID. Stop NSAID and increase omeprazole to 40 mg daily.  Check for pancreatitis, anemia and liver issues with labs . If not improving eval with Hpylori and US gallbladder.

## 2017-02-09 NOTE — Progress Notes (Signed)
   Subjective:    Patient ID: Beth Fox, female    DOB: 11-Feb-1951, 66 y.o.   MRN: [id number]  HPI 66 year old female pt with history  of chronic constipation and GERD resents with new onset pain in abdomen ( epigastric) with eating.  Started 4 months ago, but now in last 3 weeks several times a week. No radiation of pain. No N/V. No fever.  No melena or hematochezia. TUMS helps some.  No improvement with stopping greasy foods.  Last 30 min occur while eating not after.  She reports she uses ketoprofen as needed for migraine.. Once weekly.  She is also using diclofenac daily for arthritis back pain. She is using omeprazole 20 mg daily for GERD. If skips she has heartburn.  She does have gallbladder.  Wt Readings from Last 3 Encounters:  02/09/17 163 lb 8 oz (74.2 kg)  06/18/16 152 lb 8 oz (69.2 kg)  06/02/16 153 lb 12 oz (69.7 kg)       Review of Systems  Constitutional: Negative for fatigue and fever.  HENT: Negative for ear pain.   Eyes: Negative for pain.  Respiratory: Negative for chest tightness and shortness of breath.   Cardiovascular: Negative for chest pain, palpitations and leg swelling.  Gastrointestinal: Positive for abdominal pain and constipation. Negative for blood in stool, diarrhea, nausea, rectal pain and vomiting.  Genitourinary: Negative for dysuria.       Objective:   Physical Exam  Constitutional: Vital signs are normal. She appears well-developed and well-nourished. She is cooperative.  Non-toxic appearance. She does not appear ill. No distress.  HENT:  Head: Normocephalic.  Right Ear: Hearing, tympanic membrane, external ear and ear canal normal. Tympanic membrane is not erythematous, not retracted and not bulging.  Left Ear: Hearing, tympanic membrane, external ear and ear canal normal. Tympanic membrane is not erythematous, not retracted and not bulging.  Nose: No mucosal edema or rhinorrhea. Right sinus exhibits no maxillary sinus  tenderness and no frontal sinus tenderness. Left sinus exhibits no maxillary sinus tenderness and no frontal sinus tenderness.  Mouth/Throat: Uvula is midline, oropharynx is clear and moist and mucous membranes are normal.  Eyes: Conjunctivae, EOM and lids are normal. Pupils are equal, round, and reactive to light. Lids are everted and swept, no foreign bodies found.  Neck: Trachea normal and normal range of motion. Neck supple. Carotid bruit is not present. No thyroid mass and no thyromegaly present.  Cardiovascular: Normal rate, regular rhythm, S1 normal, S2 normal, normal heart sounds, intact distal pulses and normal pulses.  Exam reveals no gallop and no friction rub.   No murmur heard. Pulmonary/Chest: Effort normal and breath sounds normal. No tachypnea. No respiratory distress. She has no decreased breath sounds. She has no wheezes. She has no rhonchi. She has no rales.  Abdominal: Soft. Normal appearance and bowel sounds are normal. There is no tenderness.  Neurological: She is alert.  Skin: Skin is warm, dry and intact. No rash noted.  Psychiatric: Her speech is normal and behavior is normal. Judgment and thought content normal. Her mood appears not anxious. Cognition and memory are normal. She does not exhibit a depressed mood.   No current abd pain       Assessment & Plan:

## 2017-02-09 NOTE — Patient Instructions (Addendum)
Stop diclofenac and ketoprofen.  Use tylenol instead for pain.  Increase omeprazole to 40 mg daily.  Please stop at the lab to set up to have labs drawn.  Call in 2 week if no improvement with stomach issue for further eval.. Gallbladder US and Hpylori test.

## 2017-02-09 NOTE — Progress Notes (Signed)
Pre visit review using our clinic review tool, if applicable. No additional management support is needed unless otherwise documented below in the visit note. 

## 2017-03-16 ENCOUNTER — Other Ambulatory Visit: Payer: Self-pay | Admitting: Family Medicine

## 2017-05-07 DIAGNOSIS — B309 Viral conjunctivitis, unspecified: Secondary | ICD-10-CM | POA: Diagnosis not present

## 2017-05-07 DIAGNOSIS — R05 Cough: Secondary | ICD-10-CM | POA: Diagnosis not present

## 2017-05-14 ENCOUNTER — Ambulatory Visit (INDEPENDENT_AMBULATORY_CARE_PROVIDER_SITE_OTHER): Payer: Medicare Other | Admitting: Family Medicine

## 2017-05-14 ENCOUNTER — Encounter: Payer: Self-pay | Admitting: Family Medicine

## 2017-05-14 DIAGNOSIS — R059 Cough, unspecified: Secondary | ICD-10-CM

## 2017-05-14 DIAGNOSIS — R05 Cough: Secondary | ICD-10-CM

## 2017-05-14 MED ORDER — LIDOCAINE VISCOUS 2 % MT SOLN: 5.0000 mL | Freq: Four times a day (QID) | OROMUCOSAL | 0 refills | Status: DC | PRN

## 2017-05-14 MED ORDER — BENZONATATE 200 MG PO CAPS: 200.0000 mg | ORAL_CAPSULE | Freq: Three times a day (TID) | ORAL | 0 refills | Status: DC | PRN

## 2017-05-14 MED ORDER — CIPROFLOXACIN HCL 0.3 % OP SOLN: OPHTHALMIC | Status: DC

## 2017-05-14 MED ORDER — AMOXICILLIN-POT CLAVULANATE 875-125 MG PO TABS: 1.0000 | ORAL_TABLET | Freq: Two times a day (BID) | ORAL | 0 refills | Status: DC

## 2017-05-14 NOTE — Progress Notes (Signed)
Sx started about 2 weeks ago.  Started with ST, then stuffy nose, some cough.  Eyes were irritated, dx'd with pinkeye at [facility].  On cipro eye drops for the pinkeye, with relief.  Still with cough and ST.  Taking OTC cough med.  Has rx tessalon for cough, helps a little, but only taking 100mg  per dose. No FCNAVD.  Cough and ST still bother her.  Overall she is not better or worse in the last few days.    Meds, vitals, and allergies reviewed.   ROS: Per HPI unless specifically indicated in ROS section   GEN: nad, alert and oriented HEENT: mucous membranes moist, tm w/o erythema, nasal exam w/o erythema, clear discharge noted,  OP with cobblestoning but no exudates NECK: supple w/o LA CV: rrr.   PULM: ctab, no inc wob EXT: no edema

## 2017-05-14 NOTE — Patient Instructions (Signed)
Use the higher dose of tessalon.  Use lidocaine for throat pain.  If you continue to get worse in the meantime the start the antibiotics.   Take care.  Glad to see you.

## 2017-05-16 DIAGNOSIS — R05 Cough: Secondary | ICD-10-CM | POA: Insufficient documentation

## 2017-05-16 DIAGNOSIS — R059 Cough, unspecified: Secondary | ICD-10-CM | POA: Insufficient documentation

## 2017-05-16 NOTE — Assessment & Plan Note (Signed)
Nontoxic. Discussed with patient. She could have a benign viral illness with a relatively prolonged course. Use the higher dose of tessalon for cough.  Use lidocaine for throat pain.  If she continues to get worse in the meantime the start the antibiotics- discussed with patient that after 2 weeks it may be reasonable to eventually treat her presumptively. She agrees. Okay for outpatient follow-up.

## 2017-06-03 ENCOUNTER — Other Ambulatory Visit: Payer: Self-pay | Admitting: Family Medicine

## 2017-06-04 NOTE — Telephone Encounter (Signed)
Last office visit 05/14/2017 with Dr. Damita Dunnings for cough.  Last refilled 11/17/2016 for #90 with 3 refills.  Ok to refill?

## 2017-06-06 ENCOUNTER — Other Ambulatory Visit: Payer: Self-pay | Admitting: Family Medicine

## 2017-07-01 ENCOUNTER — Other Ambulatory Visit: Payer: Self-pay | Admitting: Family Medicine

## 2017-07-08 ENCOUNTER — Other Ambulatory Visit: Payer: Self-pay | Admitting: Family Medicine

## 2017-07-26 DIAGNOSIS — L980 Pyogenic granuloma: Secondary | ICD-10-CM | POA: Diagnosis not present

## 2017-07-29 DIAGNOSIS — L980 Pyogenic granuloma: Secondary | ICD-10-CM | POA: Diagnosis not present

## 2017-08-03 DIAGNOSIS — L980 Pyogenic granuloma: Secondary | ICD-10-CM | POA: Diagnosis not present

## 2017-08-05 ENCOUNTER — Other Ambulatory Visit (INDEPENDENT_AMBULATORY_CARE_PROVIDER_SITE_OTHER): Payer: Medicare Other

## 2017-08-05 DIAGNOSIS — E538 Deficiency of other specified B group vitamins: Secondary | ICD-10-CM

## 2017-08-05 DIAGNOSIS — R5383 Other fatigue: Secondary | ICD-10-CM

## 2017-08-05 DIAGNOSIS — E785 Hyperlipidemia, unspecified: Secondary | ICD-10-CM | POA: Diagnosis not present

## 2017-08-05 DIAGNOSIS — E559 Vitamin D deficiency, unspecified: Secondary | ICD-10-CM

## 2017-08-05 LAB — BASIC METABOLIC PANEL
BUN: 19 mg/dL (ref 6–23)
CALCIUM: 9.5 mg/dL (ref 8.4–10.5)
CHLORIDE: 107 meq/L (ref 96–112)
CO2: 30 mEq/L (ref 19–32)
CREATININE: 0.87 mg/dL (ref 0.40–1.20)
GFR: 69.37 mL/min (ref >60.00)
Glucose, Bld: 107 mg/dL — ABNORMAL HIGH (ref 70–99)
Potassium: 4.4 mEq/L (ref 3.5–5.1)
Sodium: 142 mEq/L (ref 135–145)

## 2017-08-05 LAB — HEPATIC FUNCTION PANEL
ALBUMIN: 4 g/dL (ref 3.5–5.2)
ALT: 14 U/L (ref 0–35)
AST: 14 U/L (ref 0–37)
Alkaline Phosphatase: 78 U/L (ref 39–117)
BILIRUBIN TOTAL: 0.4 mg/dL (ref 0.2–1.2)
Bilirubin, Direct: 0.1 mg/dL (ref 0.0–0.3)
Total Protein: 6.7 g/dL (ref 6.0–8.3)

## 2017-08-05 LAB — CBC WITH DIFFERENTIAL/PLATELET
BASOS PCT: 0.7 % (ref 0.0–3.0)
Basophils Absolute: 0 10*3/uL (ref 0.0–0.1)
EOS ABS: 0.1 10*3/uL (ref 0.0–0.7)
EOS PCT: 1.5 % (ref 0.0–5.0)
HEMATOCRIT: 36.4 % (ref 36.0–46.0)
HEMOGLOBIN: 12 g/dL (ref 12.0–15.0)
Lymphocytes Relative: 22.5 % (ref 12.0–46.0)
Lymphs Abs: 1.3 10*3/uL (ref 0.7–4.0)
MCHC: 32.9 g/dL (ref 30.0–36.0)
MCV: 91.4 fl (ref 78.0–100.0)
MONO ABS: 0.4 10*3/uL (ref 0.1–1.0)
Monocytes Relative: 6.2 % (ref 3.0–12.0)
NEUTROS ABS: 4.1 10*3/uL (ref 1.4–7.7)
Neutrophils Relative %: 69.1 % (ref 43.0–77.0)
PLATELETS: 231 10*3/uL (ref 150.0–400.0)
RBC: 3.98 Mil/uL (ref 3.87–5.11)
RDW: 13.3 % (ref 11.5–15.5)
WBC: 5.9 10*3/uL (ref 4.0–10.5)

## 2017-08-05 LAB — VITAMIN D 25 HYDROXY (VIT D DEFICIENCY, FRACTURES): VITD: 18.33 ng/mL — AB (ref 30.00–100.00)

## 2017-08-05 LAB — LIPID PANEL
CHOL/HDL RATIO: 4
CHOLESTEROL: 210 mg/dL — AB (ref 0–200)
HDL: 47.7 mg/dL (ref >39.00)
LDL CALC: 140 mg/dL — AB (ref 0–99)
NonHDL: 162.09
TRIGLYCERIDES: 109 mg/dL (ref 0.0–149.0)
VLDL: 21.8 mg/dL (ref 0.0–40.0)

## 2017-08-05 LAB — VITAMIN B12: VITAMIN B 12: 720 pg/mL (ref 211–911)

## 2017-08-05 LAB — TSH: TSH: 1.28 u[IU]/mL (ref 0.35–4.50)

## 2017-08-12 ENCOUNTER — Ambulatory Visit (INDEPENDENT_AMBULATORY_CARE_PROVIDER_SITE_OTHER): Payer: Medicare Other | Admitting: Family Medicine

## 2017-08-12 ENCOUNTER — Encounter: Payer: Self-pay | Admitting: Family Medicine

## 2017-08-12 VITALS — Temp 98.8°F | Ht 63.5 in | Wt 161.8 lb

## 2017-08-12 DIAGNOSIS — Z23 Encounter for immunization: Secondary | ICD-10-CM

## 2017-08-12 DIAGNOSIS — I10 Essential (primary) hypertension: Secondary | ICD-10-CM | POA: Diagnosis not present

## 2017-08-12 DIAGNOSIS — E559 Vitamin D deficiency, unspecified: Secondary | ICD-10-CM | POA: Insufficient documentation

## 2017-08-12 DIAGNOSIS — Z0000 Encounter for general adult medical examination without abnormal findings: Secondary | ICD-10-CM | POA: Diagnosis not present

## 2017-08-12 DIAGNOSIS — F332 Major depressive disorder, recurrent severe without psychotic features: Secondary | ICD-10-CM | POA: Diagnosis not present

## 2017-08-12 DIAGNOSIS — E7800 Pure hypercholesterolemia, unspecified: Secondary | ICD-10-CM

## 2017-08-12 DIAGNOSIS — L980 Pyogenic granuloma: Secondary | ICD-10-CM | POA: Diagnosis not present

## 2017-08-12 DIAGNOSIS — R7309 Other abnormal glucose: Secondary | ICD-10-CM | POA: Diagnosis not present

## 2017-08-12 MED ORDER — VITAMIN D (ERGOCALCIFEROL) 1.25 MG (50000 UNIT) PO CAPS: 50000.0000 [IU] | ORAL_CAPSULE | ORAL | 0 refills | Status: DC

## 2017-08-12 NOTE — Patient Instructions (Addendum)
Restart red yeast rice 600 mg 2 tabs twice daily . Work on low cholesterol diet, avoid animal fats, fried foods.   Replace vit D.. X 1 2 weeks then start OTC vit D 400 IU 1-2 times daily  Call to set mammogram on your own.

## 2017-08-12 NOTE — Assessment & Plan Note (Signed)
Replete x 12 weeks then OTC

## 2017-08-12 NOTE — Assessment & Plan Note (Signed)
8/6% 10 year risk per AHA calc.  Restart red yeast rice and low cholesterol diet.

## 2017-08-12 NOTE — Progress Notes (Signed)
Subjective:    Patient ID: Beth Fox, female    DOB: July 30, 1951, 66 y.o.   MRN: [id number]  HPI   The patient presents for  welcome to medicare wellness, complete physical and review of chronic health problems.   I have personally reviewed the Medicare Annual Wellness questionnaire and have noted 1. The patient's medical and social history 2. Their use of alcohol, tobacco or illicit drugs 3. Their current medications and supplements 4. The patient's functional ability including ADL's, fall risks, home safety risks and hearing or visual             impairment. 5. Diet and physical activities 6. Evidence for depression or mood disorders 7.         Updated provider list Cognitive evaluation was performed and recorded on pt medicare questionnaire form. The patients weight, height, BMI and visual acuity have been recorded in the chart  I have made referrals, counseling and provided education to the patient based review of the above and I have provided the pt with a written personalized care plan for preventive services.   Documentation of this information was scanned into the electronic record under the media tab.  Hypertension:   Good control on HCTZ   BP Readings from Last 3 Encounters:  05/14/17 132/88  02/09/17 110/62  06/18/16 110/70  Using medication without problems or lightheadedness:  none Chest pain with exertion: none Edema:none Short of breath: none Average home BPs: Other issues:  Elevated Cholesterol:  Inadeqaute control on no med. Lab Results  Component Value Date   CHOL 210 (H) 08/05/2017   HDL 47.70 08/05/2017   LDLCALC 140 (H) 08/05/2017   LDLDIRECT 167.7 09/01/2013   TRIG 109.0 08/05/2017   CHOLHDL 4 08/05/2017  Using medications without problems: Muscle aches:  Diet compliance: moderate  Exercise: none Other complaints:   prediabetes  Stable control on no med. 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  Social History /Family History/Past Medical History reviewed in detail and updated in EMR if needed.  Has history of Right tram flap and reduction in left breast, last 06/2010. No complications.  Has history of right breast CA 14 years ago.   Temperature 98.8 F (37.1 C), temperature source Oral, height 5' 3.5" (1.613 m), weight 161 lb 12 oz (73.4 kg).   Advance directives and end of life planning reviewed in detail with patient and documented in EMR. Patient given handout on advance care directives if needed. HCPOA and living will updated if needed. Fall Risk  08/12/2017 05/14/2017  Falls in the past year? No No   Depression screen PHQ 2/9 08/12/2017  Decreased Interest 0  Down, Depressed, Hopeless 0  PHQ - 2 Score 0    Hearing Screening   Method: Audiometry   125Hz  250Hz  500Hz  1000Hz  2000Hz  3000Hz  4000Hz  6000Hz  8000Hz   Right ear:   20 20 20  20     Left ear:   20 20 20  20     Vision Screening Comments: Eye Exam at [facility] 02/2017   Review of Systems  Constitutional: Negative for fatigue and fever.  HENT: Negative for congestion.   Eyes: Negative for pain.  Respiratory: Negative for cough and shortness of breath.   Cardiovascular: Negative for chest pain, palpitations and leg swelling.  Gastrointestinal: Negative for abdominal pain.  Genitourinary: Negative for dysuria and  vaginal bleeding.  Musculoskeletal: Negative for back pain.  Neurological: Negative for syncope, light-headedness and headaches.  Psychiatric/Behavioral: Negative for dysphoric mood.       Objective:   Physical Exam  Constitutional: Vital signs are normal. She appears well-developed and well-nourished. She is cooperative.  Non-toxic appearance. She does not appear ill. No distress.  HENT:  Head: Normocephalic.  Right Ear: Hearing, tympanic membrane, external ear and ear canal normal.  Left Ear: Hearing, tympanic  membrane, external ear and ear canal normal.  Nose: Nose normal.  Eyes: Pupils are equal, round, and reactive to light. Conjunctivae, EOM and lids are normal. Lids are everted and swept, no foreign bodies found.  Neck: Trachea normal and normal range of motion. Neck supple. Carotid bruit is not present. No thyroid mass and no thyromegaly present.  Cardiovascular: Normal rate, regular rhythm, S1 normal, S2 normal, normal heart sounds and intact distal pulses.  Exam reveals no gallop.   No murmur heard. Pulmonary/Chest: Effort normal and breath sounds normal. No respiratory distress. She has no wheezes. She has no rhonchi. She has no rales.  Abdominal: Soft. Normal appearance and bowel sounds are normal. She exhibits no distension, no fluid wave, no abdominal bruit and no mass. There is no hepatosplenomegaly. There is no tenderness. There is no rebound, no guarding and no CVA tenderness. No hernia.  Lymphadenopathy:    She has no cervical adenopathy.    She has no axillary adenopathy.  Neurological: She is alert. She has normal strength. No cranial nerve deficit or sensory deficit.  Skin: Skin is warm, dry and intact. No rash noted.  Psychiatric: Her speech is normal and behavior is normal. Judgment normal. Her mood appears not anxious. Cognition and memory are normal. She does not exhibit a depressed mood.          Assessment & Plan:  The patient's preventative maintenance and recommended screening tests for an annual wellness exam were reviewed in full today. Brought up to date unless services declined.  Counselled on the importance of diet, exercise, and its role in overall health and mortality. The patient's FH and SH was reviewed, including their home life, tobacco status, and drug and alcohol status.   Vaccines: Refused shingles. Uptodate with tdap, flu given today. Colon:Not sure when last colonoscopy done, likely over 10 years but was nml. She has opted for cologuard DEXA: No  family history of osteoporosis, got menses average, menopause age 63, no steroid use. She is moderate risk   last done 2016, osteopenia repeat in 5 years. Mammo:stable 03/2015 , personal history of breast cancer.  Pap/DVE: nml pap, neg HPV in 12/2012, no history of abnormal paps no further indicated  Nonsmoker.  HEP C:   done  EKG: normal EKG, normal sinus rhythm, there are no previous tracings available for comparison.

## 2017-08-25 DIAGNOSIS — Z1212 Encounter for screening for malignant neoplasm of rectum: Secondary | ICD-10-CM | POA: Diagnosis not present

## 2017-08-25 DIAGNOSIS — Z1211 Encounter for screening for malignant neoplasm of colon: Secondary | ICD-10-CM | POA: Diagnosis not present

## 2017-08-25 LAB — COLOGUARD: COLOGUARD: NEGATIVE

## 2017-08-28 ENCOUNTER — Other Ambulatory Visit: Payer: Self-pay | Admitting: Family Medicine

## 2017-08-30 ENCOUNTER — Other Ambulatory Visit: Payer: Self-pay | Admitting: Family Medicine

## 2017-08-30 DIAGNOSIS — Z1231 Encounter for screening mammogram for malignant neoplasm of breast: Secondary | ICD-10-CM

## 2017-08-30 NOTE — Assessment & Plan Note (Signed)
Well controlled. Continue current medication.  

## 2017-08-30 NOTE — Assessment & Plan Note (Signed)
Stable control. Encouraged exercise, weight loss, healthy eating habits.  

## 2017-08-30 NOTE — Assessment & Plan Note (Signed)
Stable control. 

## 2017-09-14 ENCOUNTER — Encounter: Payer: Self-pay | Admitting: Family Medicine

## 2017-09-21 ENCOUNTER — Ambulatory Visit
Admission: RE | Admit: 2017-09-21 | Discharge: 2017-09-21 | Disposition: A | Discharge status: Home / Self Care | Payer: Medicare Other | Source: Ambulatory Visit | Attending: Family Medicine | Admitting: Family Medicine

## 2017-09-21 DIAGNOSIS — Z1231 Encounter for screening mammogram for malignant neoplasm of breast: Secondary | ICD-10-CM

## 2017-10-19 ENCOUNTER — Encounter: Payer: Self-pay | Admitting: Family Medicine

## 2017-10-19 ENCOUNTER — Ambulatory Visit (INDEPENDENT_AMBULATORY_CARE_PROVIDER_SITE_OTHER): Payer: Medicare Other | Admitting: Family Medicine

## 2017-10-19 VITALS — BP 118/74 | HR 90 | Temp 98.1°F | Ht 63.5 in | Wt 167.0 lb

## 2017-10-19 DIAGNOSIS — R1032 Left lower quadrant pain: Secondary | ICD-10-CM | POA: Diagnosis not present

## 2017-10-19 MED ORDER — OMEPRAZOLE 20 MG PO CPDR: 20.0000 mg | DELAYED_RELEASE_CAPSULE | Freq: Every day | ORAL | 11 refills | Status: DC

## 2017-10-19 NOTE — Assessment & Plan Note (Signed)
   No clear epigastric as noted earlier... Cbc, CMET and lipase nml  Now more  LLQ pain... ? Ovarian issue vs IBS vs other. eval with US pelvis , transvag

## 2017-10-19 NOTE — Patient Instructions (Addendum)
Keep up with the fiber, water.  Can try a course of probiotics ([facility]) Please stop at the front desk to set up referral.  Can try home exercise for BBPV

## 2017-10-19 NOTE — Progress Notes (Signed)
Subjective:    Patient ID: Beth Fox, female    DOB: 17-Aug-1951, 67 y.o.   MRN: [id number]  HPI    67 year old female with GERD, constipation presents for   Continued abdominal pain.. Started 11-12  months ago.  Pain in left lower abdomen  comes and goes, pain starts with eating.  She is very gassy and bloated when eating. Feels better with BM and release of gas.  no specific food causes.  No diarrhea... Constipation stable on senna.. Has BM  daily No radiation of pain. No N/V. No fever.  No melena or hematochezia. TUMS helps some.  No improvement with stopping greasy foods.  No vaginal bleeding  She is lactose intolerant.. Has switched to lactaid.  She has stopped NSAIDs.. Was taking diclofenac.  She did not increase prilosec to 40 mg.. Still takes 20 mg daily.  CMET, cbc and lipase were nml in 02/2017.  She still has uterus and ovaries.  Colonoscopy: 2006 nml  cologuard neg  08/2017  Family HX: no  History of uterine, ovarian or colon cancer.  Blood pressure 118/74, pulse 90, temperature 98.1 F (36.7 C), temperature source Oral, height 5' 3.5" (1.613 m), weight 167 lb (75.8 kg), SpO2 96 %.   Review of Systems  Constitutional: Negative for fatigue, fever and unexpected weight change.  HENT: Negative for congestion, ear pain, sinus pressure, sneezing, sore throat and trouble swallowing.   Eyes: Negative for pain and itching.  Respiratory: Negative for cough, shortness of breath and wheezing.   Cardiovascular: Negative for chest pain, palpitations and leg swelling.  Gastrointestinal: Negative for abdominal pain, blood in stool, constipation, diarrhea and nausea.  Genitourinary: Negative for difficulty urinating, dysuria, hematuria, menstrual problem and vaginal discharge.  Skin: Negative for rash.  Neurological: Negative for syncope, weakness, light-headedness, numbness and headaches.  Psychiatric/Behavioral: Negative for confusion and dysphoric mood. The  patient is not nervous/anxious.   She briefly noted intermittant positional vertigo... Gave BPPV exercsie to try.     Objective:   Physical Exam  Constitutional: Vital signs are normal. She appears well-developed and well-nourished. She is cooperative.  Non-toxic appearance. She does not appear ill. No distress.  HENT:  Head: Normocephalic.  Right Ear: Hearing, tympanic membrane, external ear and ear canal normal. Tympanic membrane is not erythematous, not retracted and not bulging.  Left Ear: Hearing, tympanic membrane, external ear and ear canal normal. Tympanic membrane is not erythematous, not retracted and not bulging.  Nose: No mucosal edema or rhinorrhea. Right sinus exhibits no maxillary sinus tenderness and no frontal sinus tenderness. Left sinus exhibits no maxillary sinus tenderness and no frontal sinus tenderness.  Mouth/Throat: Uvula is midline, oropharynx is clear and moist and mucous membranes are normal.  Eyes: Conjunctivae, EOM and lids are normal. Pupils are equal, round, and reactive to light. Lids are everted and swept, no foreign bodies found.  Neck: Trachea normal and normal range of motion. Neck supple. Carotid bruit is not present. No thyroid mass and no thyromegaly present.  Cardiovascular: Normal rate, regular rhythm, S1 normal, S2 normal, normal heart sounds, intact distal pulses and normal pulses. Exam reveals no gallop and no friction rub.  No murmur heard. Pulmonary/Chest: Effort normal and breath sounds normal. No tachypnea. No respiratory distress. She has no decreased breath sounds. She has no wheezes. She has no rhonchi. She has no rales.  Abdominal: Soft. Normal appearance and bowel sounds are normal. There is no hepatosplenomegaly. There is tenderness in the left lower  quadrant. There is no rigidity, no rebound, no guarding and no CVA tenderness.  Neurological: She is alert.  Skin: Skin is warm, dry and intact. No rash noted.  Psychiatric: Her speech is normal  and behavior is normal. Judgment and thought content normal. Her mood appears not anxious. Cognition and memory are normal. She does not exhibit a depressed mood.          Assessment & Plan:

## 2017-10-21 ENCOUNTER — Ambulatory Visit
Admission: RE | Admit: 2017-10-21 | Discharge: 2017-10-21 | Disposition: A | Discharge status: Home / Self Care | Payer: Medicare Other | Source: Ambulatory Visit | Attending: Family Medicine | Admitting: Family Medicine

## 2017-10-21 DIAGNOSIS — N83201 Unspecified ovarian cyst, right side: Secondary | ICD-10-CM | POA: Insufficient documentation

## 2017-10-21 DIAGNOSIS — N83291 Other ovarian cyst, right side: Secondary | ICD-10-CM | POA: Diagnosis not present

## 2017-10-21 DIAGNOSIS — R1032 Left lower quadrant pain: Secondary | ICD-10-CM

## 2017-10-22 ENCOUNTER — Encounter: Payer: Self-pay | Admitting: Family Medicine

## 2017-10-22 DIAGNOSIS — N83201 Unspecified ovarian cyst, right side: Secondary | ICD-10-CM | POA: Insufficient documentation

## 2017-10-25 ENCOUNTER — Other Ambulatory Visit: Payer: Self-pay | Admitting: *Deleted

## 2017-10-25 MED ORDER — GABAPENTIN 100 MG PO CAPS: ORAL_CAPSULE | ORAL | 1 refills | Status: DC

## 2017-10-25 NOTE — Telephone Encounter (Signed)
Last office visit 10/19/2017.  Last refilled 06/04/2017 for #90 with 3 refills.  Pharmacy is asking for 90 day supply #270.  Ok to refill for 90 day supply?

## 2017-11-05 ENCOUNTER — Other Ambulatory Visit: Payer: Self-pay | Admitting: Family Medicine

## 2018-01-13 ENCOUNTER — Ambulatory Visit (INDEPENDENT_AMBULATORY_CARE_PROVIDER_SITE_OTHER): Payer: Medicare Other | Admitting: Family Medicine

## 2018-01-13 ENCOUNTER — Other Ambulatory Visit: Payer: Self-pay

## 2018-01-13 ENCOUNTER — Encounter: Payer: Self-pay | Admitting: Family Medicine

## 2018-01-13 DIAGNOSIS — R1032 Left lower quadrant pain: Secondary | ICD-10-CM | POA: Diagnosis not present

## 2018-01-13 NOTE — Patient Instructions (Addendum)
Please stop at the front desk to set up referral.  Start FODMAP DIET.  Continue probiotic.

## 2018-01-13 NOTE — Progress Notes (Signed)
Subjective:    Patient ID: Beth Fox, female    DOB: [birth year], 67 y.o.   MRN: [id number]  HPI   67 year old female presents for follow up abdominal pain  Hx of GERD, constipation 1 year of intermittant  Pain in LLQ comes and goes, pain starts with eating.  She is very gassy and bloated when eating. Feels better with BM and release of gas.  no specific food causes.  No diarrhea... Constipation stable on senna.. Has BM  daily No radiation of pain. No N/V. No fever. No melena or hematochezia. TUMS helps some. No improvement with stopping greasy foods.  No vaginal bleeding  She is lactose intolerant.. Has switched to lactaid.  She has stopped NSAIDs.. Was taking diclofenac.  She did not increase prilosec to 40 mg.. Still takes 20 mg daily.  CMET, cbc and lipase were nml in 02/2017.   US pelvic 10/2017: 1. There is a 4.6 x 2.5 x 2.8 cm cyst in the right ovary with a single thin septation. Recommend a follow-up ultrasound in 1 year to ensure stability. 2. No other abnormalities identified.  Recommended fiber, water and probiotics at last OV 10/2017   Today she reports:  He abd pain is not better or worse. Probiotic have helped gas some.  Still occurs during  eating.. No specific triggers.  Last week happened 2 days when eating.  Has chronic issue with constipation but regularr on senna    Colonoscopy: 2006 nml  cologuard neg  08/2017  Family HX: no  History of uterine, ovarian or colon cancer.  Review of Systems  Constitutional: Negative for fatigue and fever.  HENT: Negative for congestion.   Eyes: Negative for pain.  Respiratory: Negative for cough and shortness of breath.   Cardiovascular: Negative for chest pain, palpitations and leg swelling.  Gastrointestinal: Positive for abdominal pain and constipation. Negative for blood in stool.  Genitourinary: Negative for dysuria and vaginal bleeding.  Musculoskeletal: Negative for back pain.  Neurological:  Negative for syncope, light-headedness and headaches.  Psychiatric/Behavioral: Negative for dysphoric mood.       Objective:   Physical Exam  Constitutional: Vital signs are normal. She appears well-developed and well-nourished. She is cooperative.  Non-toxic appearance. She does not appear ill. No distress.  HENT:  Head: Normocephalic.  Right Ear: Hearing, tympanic membrane, external ear and ear canal normal. Tympanic membrane is not erythematous, not retracted and not bulging.  Left Ear: Hearing, tympanic membrane, external ear and ear canal normal. Tympanic membrane is not erythematous, not retracted and not bulging.  Nose: No mucosal edema or rhinorrhea. Right sinus exhibits no maxillary sinus tenderness and no frontal sinus tenderness. Left sinus exhibits no maxillary sinus tenderness and no frontal sinus tenderness.  Mouth/Throat: Uvula is midline, oropharynx is clear and moist and mucous membranes are normal.  Eyes: Pupils are equal, round, and reactive to light. Conjunctivae, EOM and lids are normal. Lids are everted and swept, no foreign bodies found.  Neck: Trachea normal and normal range of motion. Neck supple. Carotid bruit is not present. No thyroid mass and no thyromegaly present.  Cardiovascular: Normal rate, regular rhythm, S1 normal, S2 normal, normal heart sounds, intact distal pulses and normal pulses. Exam reveals no gallop and no friction rub.  No murmur heard. Pulmonary/Chest: Effort normal and breath sounds normal. No tachypnea. No respiratory distress. She has no decreased breath sounds. She has no wheezes. She has no rhonchi. She has no rales.  Abdominal: Soft. Normal  appearance and bowel sounds are normal. There is no tenderness.  Neurological: She is alert.  Skin: Skin is warm, dry and intact. No rash noted.  Psychiatric: Her speech is normal and behavior is normal. Judgment and thought content normal. Her mood appears not anxious. Cognition and memory are normal. She  does not exhibit a depressed mood.          Assessment & Plan:

## 2018-01-13 NOTE — Assessment & Plan Note (Signed)
So far neg eval..   Improvement with probiotic.Marland Kitchen Less gas.  But still with pain.   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  Mat benefit from antispasmodic but pt wishes to see GI first.  Will start pt on FODMAP diet, but given unclear diagnosis will refer to GI.

## 2018-01-19 ENCOUNTER — Ambulatory Visit (INDEPENDENT_AMBULATORY_CARE_PROVIDER_SITE_OTHER): Payer: Medicare Other | Admitting: Gastroenterology

## 2018-01-19 ENCOUNTER — Encounter: Payer: Self-pay | Admitting: Gastroenterology

## 2018-01-19 VITALS — BP 117/74 | HR 86 | Temp 98.2°F

## 2018-01-19 DIAGNOSIS — K5900 Constipation, unspecified: Secondary | ICD-10-CM | POA: Diagnosis not present

## 2018-01-19 DIAGNOSIS — R1030 Lower abdominal pain, unspecified: Secondary | ICD-10-CM | POA: Diagnosis not present

## 2018-01-19 NOTE — Progress Notes (Signed)
Beth Fox, [place] [address]  Main: [phone]  Fax: [phone]   Gastroenterology Consultation  Referring Provider:     Jinny Sanders, MD Primary Care Physician:  Jinny Sanders, MD Primary Gastroenterologist:  Dr. [name] Reason for Consultation:     Abdominal pain        HPI:   Beth Fox is a 67 y.o. y/o female referred for consultation & management  by Dr. Diona Browner, Mervyn Gay, MD.  Patient reports 1-1/2-year history of bilateral lower quadrant abdominal pain, intermittent.  Occurs twice a week.  Unrelated to meals.  Improves after passing gas,   Or having a bowel movement.  No nausea or vomiting.  No blood in stool.  No weight loss.  Reports chronic history of constipation and takes twice a day senna for it.  Does not move bowels daily.  Patient reportedly had a colonoscopy in 2006 that was normal.  Is getting Colo guard testing by her primary care provider, last one was in November 2018 and was negative.  No family history of colon cancer.  Does not eat fruits and vegetables or high-fiber diet.  Past Medical History:  Diagnosis Date  . 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 APPENDECTOMY    . BREAST SURGERY     mastectomy on right   . MASTECTOMY Right Oct 1997   c Reconstruction surgery on Right Breast  . REDUCTION MAMMAPLASTY Left 2012  . TUBAL LIGATION      Prior to Admission medications   Medication Sig Start Date End Date Taking? Authorizing Provider  gabapentin (NEURONTIN) 100 MG capsule TAKE 1 CAPSULE BY MOUTH 3 TIMES DAILY. START AT 100 MG AT BEDTIME & GRADUALLY INCREASE TO 3XDAY 10/25/17  Yes Bedsole, Amy E, MD  hydrochlorothiazide (HYDRODIURIL) 25 MG tablet TAKE 1 TABLET BY MOUTH EVERY DAY 08/30/17  Yes Bedsole, Amy E, MD  omeprazole (PRILOSEC) 20 MG capsule Take 1 capsule (20 mg total) by mouth daily. 10/19/17  Yes Bedsole, Amy E, MD    Probiotic Product (PROBIOTIC-10 PO) Take 1 tablet by mouth daily.   Yes [provider]  senna-docusate (SENOKOT-S) 8.6-50 MG per tablet Take 1 tablet by mouth daily as needed.     Yes [provider]    Family History  Problem Relation Age of Onset  . Diabetes Mother   . Hypertension Mother   . Stroke Mother   . Cancer Father        melonoma  . Diabetes Father   . Hypertension Father   . Parkinsonism Father   . Breast cancer Maternal Aunt   . Breast cancer Paternal Aunt   . Breast cancer Cousin   . Hypertension Sister   . Arthritis Sister   . Stroke Maternal Grandmother   . Stroke Paternal Grandmother   . Heart attack Paternal Grandfather        before age 55  . Hypertension Sister   . Arthritis Sister   . Breast cancer Cousin      Social History   Tobacco Use  . Smoking status: Never Smoker  . Smokeless tobacco: Never Used  Substance Use Topics  . Alcohol use: No    Alcohol/week: 0.0 oz  . Drug use: No    Allergies as of 01/19/2018  . (No Known Allergies)    Review of Systems:  All systems reviewed and negative except where noted in HPI.   Physical Exam:  BP 117/74   Pulse 86   Temp 98.2 F (36.8 C) (Oral)  No LMP recorded. Patient is postmenopausal. Psych:  Alert and cooperative. Normal mood and affect. General:   Alert,  Well-developed, well-nourished, pleasant and cooperative in NAD Head:  Normocephalic and atraumatic. Eyes:  Sclera clear, no icterus.   Conjunctiva pink. Ears:  Normal auditory acuity. Nose:  No deformity, discharge, or lesions. Mouth:  No deformity or lesions,oropharynx pink & moist. Neck:  Supple; no masses or thyromegaly. Lungs:  Respirations even and unlabored.  Clear throughout to auscultation.   No wheezes, crackles, or rhonchi. No acute distress. Heart:  Regular rate and rhythm; no murmurs, clicks, rubs, or gallops. Abdomen:  Normal bowel sounds.  No bruits.  Soft, non-tender and non-distended without  masses, hepatosplenomegaly or hernias noted.  No guarding or rebound tenderness.    Msk:  Symmetrical without gross deformities. Good, equal movement & strength bilaterally. Pulses:  Normal pulses noted. Extremities:  No clubbing or edema.  No cyanosis. Neurologic:  Alert and oriented x3;  grossly normal neurologically. Skin:  Intact without significant lesions or rashes. No jaundice. Lymph Nodes:  No significant cervical adenopathy. Psych:  Alert and cooperative. Normal mood and affect.   Labs: CBC    Component Value Date/Time   WBC 5.9 08/05/2017 1127   RBC 3.98 08/05/2017 1127   HGB 12.0 08/05/2017 1127   HCT 36.4 08/05/2017 1127   PLT 231.0 08/05/2017 1127   MCV 91.4 08/05/2017 1127   MCH 27.1 05/05/2013 1527   MCHC 32.9 08/05/2017 1127   RDW 13.3 08/05/2017 1127   LYMPHSABS 1.3 08/05/2017 1127   MONOABS 0.4 08/05/2017 1127   EOSABS 0.1 08/05/2017 1127   BASOSABS 0.0 08/05/2017 1127   CMP     Component Value Date/Time   NA 142 08/05/2017 1127   K 4.4 08/05/2017 1127   CL 107 08/05/2017 1127   CO2 30 08/05/2017 1127   GLUCOSE 107 (H) 08/05/2017 1127   BUN 19 08/05/2017 1127   CREATININE 0.87 08/05/2017 1127   CALCIUM 9.5 08/05/2017 1127   PROT 6.7 08/05/2017 1127   ALBUMIN 4.0 08/05/2017 1127   AST 14 08/05/2017 1127   ALT 14 08/05/2017 1127   ALKPHOS 78 08/05/2017 1127   BILITOT 0.4 08/05/2017 1127   GFRNONAA 77.07 08/27/2010 0933    Imaging Studies: No results found.  Assessment and Plan:   Beth Fox is a 67 y.o. y/o female has been referred for bilateral lower quadrant abdominal pain for 1-1/2 years, which improves after passing gas or having a bowel movement   No alarm symptoms present Clinical history and symptoms are most consistent with abdominal pain due to constipation High-fiber diuretics discussed extensively I have asked her to stop senna I have asked her to start taking MiraLAX once or twice daily, with a goal of maintaining soft  bowel movements every day or every other day We can add Metamucil to this regimen if she is not at goal with her bowel movements I have asked her to call us if she has any problems with the above instructions I have also asked her to call us if abdominal pain does not improve or worsens She verbalized understanding  She does not have any dysphagia, blood in stool, melena, heartburn, weight loss, or any alarm symptoms to indicate endoscopy at this time Lab work reviewed, does not show any anemia which  is reassuring as well.   Liver enzymes are normal as well.  She would like to continue colorectal cancer screening with her primary care provider with annual stool testing  If symptoms do not improve, can consider imaging or endoscopy at that time  Dr Beth Antigua

## 2018-01-19 NOTE — Patient Instructions (Addendum)
F/U 3 months Miralax 1-2 times daily   High-Fiber Diet Fiber, also called dietary fiber, is a type of carbohydrate found in fruits, vegetables, whole grains, and beans. A high-fiber diet can have many health benefits. Your health care provider may recommend a high-fiber diet to help:  Prevent constipation. Fiber can make your bowel movements more regular.  Lower your cholesterol.  Relieve hemorrhoids, uncomplicated diverticulosis, or irritable bowel syndrome.  Prevent overeating as part of a weight-loss plan.  Prevent heart disease, type 2 diabetes, and certain cancers.  What is my plan? The recommended daily intake of fiber includes:  38 grams for men under age 56.  70 grams for men over age 33.  38 grams for women under age 8.  26 grams for women over age 68.  You can get the recommended daily intake of dietary fiber by eating a variety of fruits, vegetables, grains, and beans. Your health care provider may also recommend a fiber supplement if it is not possible to get enough fiber through your diet. What do I need to know about a high-fiber diet?  Fiber supplements have not been widely studied for their effectiveness, so it is better to get fiber through food sources.  Always check the fiber content on thenutrition facts label of any prepackaged food. Look for foods that contain at least 5 grams of fiber per serving.  Ask your dietitian if you have questions about specific foods that are related to your condition, especially if those foods are not listed in the following section.  Increase your daily fiber consumption gradually. Increasing your intake of dietary fiber too quickly may cause bloating, cramping, or gas.  Drink plenty of water. Water helps you to digest fiber. What foods can I eat? Grains Whole-grain breads. Multigrain cereal. Oats and oatmeal. Brown rice. Barley. Bulgur wheat. Bishop. Bran muffins. Popcorn. Rye wafer crackers. Vegetables Sweet potatoes.  Spinach. Kale. Artichokes. Cabbage. Broccoli. Green peas. Carrots. Squash. Fruits Berries. Pears. Apples. Oranges. Avocados. Prunes and raisins. Dried figs. Meats and Other Protein Sources Navy, kidney, pinto, and soy beans. Split peas. Lentils. Nuts and seeds. Dairy Fiber-fortified yogurt. Beverages Fiber-fortified soy milk. Fiber-fortified orange juice. Other Fiber bars. The items listed above may not be a complete list of recommended foods or beverages. Contact your dietitian for more options. What foods are not recommended? Grains White bread. Pasta made with refined flour. White rice. Vegetables Fried potatoes. Canned vegetables. Well-cooked vegetables. Fruits Fruit juice. Cooked, strained fruit. Meats and Other Protein Sources Fatty cuts of meat. Fried Sales executive or fried fish. Dairy Milk. Yogurt. Cream cheese. Sour cream. Beverages Soft drinks. Other Cakes and pastries. Butter and oils. The items listed above may not be a complete list of foods and beverages to avoid. Contact your dietitian for more information. What are some tips for including high-fiber foods in my diet?  Eat a wide variety of high-fiber foods.  Make sure that half of all grains consumed each day are whole grains.  Replace breads and cereals made from refined flour or white flour with whole-grain breads and cereals.  Replace white rice with brown rice, bulgur wheat, or millet.  Start the day with a breakfast that is high in fiber, such as a cereal that contains at least 5 grams of fiber per serving.  Use beans in place of meat in soups, salads, or pasta.  Eat high-fiber snacks, such as berries, raw vegetables, nuts, or popcorn. This information is not intended to replace advice given to you by  your health care provider. Make sure you discuss any questions you have with your health care provider. Document Released: 09/28/2005 Document Revised: 03/05/2016 Document Reviewed: 03/13/2014 Elsevier  Interactive Patient Education  [employer].

## 2018-02-07 ENCOUNTER — Encounter: Payer: Self-pay | Admitting: Podiatry

## 2018-02-07 ENCOUNTER — Ambulatory Visit (INDEPENDENT_AMBULATORY_CARE_PROVIDER_SITE_OTHER): Payer: Medicare Other

## 2018-02-07 ENCOUNTER — Ambulatory Visit (INDEPENDENT_AMBULATORY_CARE_PROVIDER_SITE_OTHER): Payer: Medicare Other | Admitting: Podiatry

## 2018-02-07 VITALS — BP 142/82 | HR 78 | Temp 97.7°F | Resp 16

## 2018-02-07 DIAGNOSIS — M722 Plantar fascial fibromatosis: Secondary | ICD-10-CM

## 2018-02-07 MED ORDER — METHYLPREDNISOLONE 4 MG PO TBPK: ORAL_TABLET | ORAL | 0 refills | Status: DC

## 2018-02-07 MED ORDER — MELOXICAM 15 MG PO TABS: 15.0000 mg | ORAL_TABLET | Freq: Every day | ORAL | 3 refills | Status: DC

## 2018-02-07 NOTE — Progress Notes (Signed)
Subjective:  Patient ID: Beth Fox, female    DOB: 1951-10-07,  MRN: [id number] HPI Chief Complaint  Patient presents with  . Foot Pain    Patient presents today for bilat heel pain x 2-3  months.  She reports a constant sharp pain with walking and it becomes worse throughout the day.  She states her left foot is worse than the right.  She has used ice, heat, stretching, Advil and Asprecreme with slight relief  . New Patient (Initial Visit)    67 y.o. female presents with the above complaint.   Review of systems: Denies fever chills nausea vomiting muscle aches pains calf pain back pain chest pain shortness of breath.  Past Medical History:  Diagnosis Date  . 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 APPENDECTOMY    . BREAST SURGERY     mastectomy on right   . MASTECTOMY Right Oct 1997   c Reconstruction surgery on Right Breast  . REDUCTION MAMMAPLASTY Left 2012  . TUBAL LIGATION      Current Outpatient Medications:  .  gabapentin (NEURONTIN) 100 MG capsule, TAKE 1 CAPSULE BY MOUTH 3 TIMES DAILY. START AT 100 MG AT BEDTIME & GRADUALLY INCREASE TO 3XDAY, Disp: 270 capsule, Rfl: 1 .  hydrochlorothiazide (HYDRODIURIL) 25 MG tablet, TAKE 1 TABLET BY MOUTH EVERY DAY, Disp: 90 tablet, Rfl: 1 .  Influenza vac split quadrivalent PF (FLUARIX) 0.5 ML injection, Fluarix Quad 2017-2018 (PF) 60 mcg (15 mcg x 4)/0.5 mL IM syringe  TO BE ADMINISTERED BY PHARMACIST FOR IMMUNIZATION, Disp: , Rfl:  .  meloxicam (MOBIC) 15 MG tablet, Take 1 tablet (15 mg total) by mouth daily., Disp: 30 tablet, Rfl: 3 .  methylPREDNISolone (MEDROL DOSEPAK) 4 MG TBPK tablet, 6 day dose pack - take as directed, Disp: 21 tablet, Rfl: 0 .  omeprazole (PRILOSEC) 20 MG capsule, Take 1 capsule (20 mg total) by mouth daily., Disp: 30 capsule, Rfl: 11 .  Probiotic Product (PROBIOTIC-10 PO), Take 1 tablet by mouth daily., Disp: , Rfl:   No Known  Allergies Review of Systems Objective:   Vitals:   02/07/18 0830  BP: (!) 142/82  Pulse: 78  Resp: 16  Temp: 97.7 F (36.5 C)    General: Well developed, nourished, in no acute distress, alert and oriented x3   Dermatological: Skin is warm, dry and supple bilateral. Nails x 10 are well maintained; remaining integument appears unremarkable at this time. There are no open sores, no preulcerative lesions, no rash or signs of infection present.  Vascular: Dorsalis Pedis artery and Posterior Tibial artery pedal pulses are 2/4 bilateral with immedate capillary fill time. Pedal hair growth present. No varicosities and no lower extremity edema present bilateral.   Neruologic: Grossly intact via light touch bilateral. Vibratory intact via tuning fork bilateral. Protective threshold with Semmes Wienstein monofilament intact to all pedal sites bilateral. Patellar and Achilles deep tendon reflexes 2+ bilateral. No Babinski or clonus noted bilateral.   Musculoskeletal: No gross boney pedal deformities bilateral. No pain, crepitus, or limitation noted with foot and ankle range of motion bilateral. Muscular strength 5/5 in all groups tested bilateral.  She has pain on palpation medial calcaneal tubercles bilateral.  No pain on medial and lateral compression of the calcaneus.  Gait: Unassisted, Nonantalgic.    Radiographs:  Radiographs demonstrate no acute findings though it does demonstrate a soft tissue increase in density at the  plantar fascial insertion site.  Small amount of swelling at the Achilles insertion site with small plantar and posterior distally and proximally oriented calcaneal heel spurs respectively.  Assessment & Plan:   Assessment: Plantar fasciitis bilateral.  Plan: Discussed etiology pathology conservative versus surgical therapies.  At this point I injected bilateral heels after sterile Betadine skin prep with 20 mg of Kenalog 5 mg of Marcaine to the point of maximal  tenderness at the plantar fashion calcaneal surgery signs of bilateral heels.  Tolerates procedure well without complications.  Her on a Medrol Dosepak to be followed by meloxicam.  Discussed appropriate shoe gear stretching exercises ice therapy sugar modifications.  Dispensed a plantar fascial brace and a night splint.  I will follow-up with her in 1 month.     Orin Eberwein T. Ackworth, [place]

## 2018-02-07 NOTE — Patient Instructions (Signed)

## 2018-02-20 ENCOUNTER — Other Ambulatory Visit: Payer: Self-pay | Admitting: Family Medicine

## 2018-03-09 ENCOUNTER — Ambulatory Visit: Payer: No Typology Code available for payment source | Admitting: Podiatry

## 2018-03-20 ENCOUNTER — Other Ambulatory Visit: Payer: Self-pay | Admitting: Family Medicine

## 2018-03-21 NOTE — Telephone Encounter (Signed)
Last office visit 01/13/2018.  Last refilled 10/25/2017 for #270 with 1 refill.  Ok to refill?

## 2018-03-22 DIAGNOSIS — M7061 Trochanteric bursitis, right hip: Secondary | ICD-10-CM | POA: Diagnosis not present

## 2018-03-31 ENCOUNTER — Encounter: Payer: Self-pay | Admitting: Podiatry

## 2018-03-31 ENCOUNTER — Ambulatory Visit (INDEPENDENT_AMBULATORY_CARE_PROVIDER_SITE_OTHER): Payer: Medicare Other | Admitting: Podiatry

## 2018-03-31 DIAGNOSIS — M722 Plantar fascial fibromatosis: Secondary | ICD-10-CM | POA: Diagnosis not present

## 2018-03-31 NOTE — Progress Notes (Signed)
She presents today for follow-up of plantar fasciitis states that is about 30% improved.  Has not been taking the meloxicam because it felt that he had been doing much for her.  She has not been wearing her fascial brace and has just recently started wearing the night splint once again.  Objective: Vital signs are stable alert and oriented x3.  Pulses are palpable.  Neurologic sensorium is intact.  Has pain on palpation medial calcaneal tubercle of her left heel.  Pain on palpation of the plantar fascial calcaneal insertion site.  Assessment: Pain in limb secondary to plantar fasciitis.  Plan: I injected once again 20 mg Kenalog 5 mg Marcaine point maximal tenderness left heel of sterile Betadine skin prep.  Tolerated procedure well without comp occasions.  Instructed her to get back to wearing her braces and her appropriate shoe gear.  She is going to continue 800 mg of ibuprofen twice daily with food.

## 2018-04-20 ENCOUNTER — Ambulatory Visit (INDEPENDENT_AMBULATORY_CARE_PROVIDER_SITE_OTHER): Payer: Medicare Other | Admitting: Gastroenterology

## 2018-04-20 ENCOUNTER — Encounter: Payer: Self-pay | Admitting: Gastroenterology

## 2018-04-20 VITALS — BP 114/70 | HR 69 | Ht 63.5 in | Wt 160.8 lb

## 2018-04-20 DIAGNOSIS — R1031 Right lower quadrant pain: Secondary | ICD-10-CM | POA: Diagnosis not present

## 2018-04-20 DIAGNOSIS — G8929 Other chronic pain: Secondary | ICD-10-CM

## 2018-04-20 NOTE — Progress Notes (Signed)
[name], MD [address]  [place]  [place], [place] [address]  Main: [phone]  Fax: [phone]   Primary Care Physician: Jinny Sanders, MD  Primary Gastroenterologist:  Dr. [name]  Chief Complaint  Patient presents with  . Follow-up    3 month bil. lower quad pain- has improved    HPI: Beth Fox is a 67 y.o. female here for follow-up of constipation induced bilateral lower quadrant pain.  This has completely resolved with taking generic MiraLAX daily.  Reports one formed soft bowel movement daily.  Denies any constipation. The patient denies abdominal or flank pain, anorexia, nausea or vomiting, dysphagia, change in bowel habits or black or bloody stools or weight loss.   Current Outpatient Medications  Medication Sig Dispense Refill  . gabapentin (NEURONTIN) 100 MG capsule TAKE 1 CAPSULE BY MOUTH 3 TIMES DAILY. START AT 100 MG AT BEDTIME & GRADUALLY INCREASE TO 3XDAY 270 capsule 1  . hydrochlorothiazide (HYDRODIURIL) 25 MG tablet TAKE 1 TABLET BY MOUTH EVERY DAY 90 tablet 1  . Influenza vac split quadrivalent PF (FLUARIX) 0.5 ML injection Fluarix Quad 2017-2018 (PF) 60 mcg (15 mcg x 4)/0.5 mL IM syringe  TO BE ADMINISTERED BY PHARMACIST FOR IMMUNIZATION    . meloxicam (MOBIC) 15 MG tablet Take 1 tablet (15 mg total) by mouth daily. 30 tablet 3  . omeprazole (PRILOSEC) 20 MG capsule Take 1 capsule (20 mg total) by mouth daily. 30 capsule 11  . Probiotic Product (PROBIOTIC-10 PO) Take 1 tablet by mouth daily.     No current facility-administered medications for this visit.     Allergies as of 04/20/2018  . (No Known Allergies)    ROS:  General: Negative for anorexia, weight loss, fever, chills, fatigue, weakness. ENT: Negative for hoarseness, difficulty swallowing , nasal congestion. CV: Negative for chest pain, angina, palpitations, dyspnea on exertion, peripheral edema.  Respiratory: Negative for dyspnea at rest, dyspnea on  exertion, cough, sputum, wheezing.  GI: See history of present illness. GU:  Negative for dysuria, hematuria, urinary incontinence, urinary frequency, nocturnal urination.  Endo: Negative for unusual weight change.    Physical Examination:   BP 114/70   Pulse 69   Ht 5' 3.5" (1.613 m)   Wt 160 lb 12.8 oz (72.9 kg)   BMI 28.04 kg/m   General: Well-nourished, well-developed in no acute distress.  Eyes: No icterus. Conjunctivae pink. Mouth: Oropharyngeal mucosa moist and pink , no lesions erythema or exudate. Neck: Supple, Trachea midline Abdomen: Bowel sounds are normal, nontender, nondistended, no hepatosplenomegaly or masses, no abdominal bruits or hernia , no rebound or guarding.   Extremities: No lower extremity edema. No clubbing or deformities. Neuro: Alert and oriented x 3.  Grossly intact. Skin: Warm and dry, no jaundice.   Psych: Alert and cooperative, normal mood and affect.   Labs: CMP     Component Value Date/Time   NA 142 08/05/2017 1127   K 4.4 08/05/2017 1127   CL 107 08/05/2017 1127   CO2 30 08/05/2017 1127   GLUCOSE 107 (H) 08/05/2017 1127   BUN 19 08/05/2017 1127   CREATININE 0.87 08/05/2017 1127   CALCIUM 9.5 08/05/2017 1127   PROT 6.7 08/05/2017 1127   ALBUMIN 4.0 08/05/2017 1127   AST 14 08/05/2017 1127   ALT 14 08/05/2017 1127   ALKPHOS 78 08/05/2017 1127   BILITOT 0.4 08/05/2017 1127   GFRNONAA 77.07 08/27/2010 0933   Lab Results  Component Value Date  WBC 5.9 08/05/2017   HGB 12.0 08/05/2017   HCT 36.4 08/05/2017   MCV 91.4 08/05/2017   PLT 231.0 08/05/2017    Imaging Studies: No results found.  Assessment and Plan:   Beth Fox is a 67 y.o. y/o female here for bilateral lower quadrant abdominal pain which has resolved with taking MiraLAX daily and resolution of constipation  Continue MiraLAX daily Patient instructed to titrate it if needed in the future  Screening colonoscopy discussed with pt., but patient would like to  continue yearly Cologuard testing with her primary care provider.  Last was in November 2018.  She was instructed to continue follow-up with her primary care provider to obtain yearly Cologuard's if she is not going to undergo colonoscopy.  She verbalized understanding of this.  If Cologuard testing is positive, she will need a colonoscopy at that time, and she understands this.  PCP can refer to [place] again if Cologuard becomes positive in the future.  Follow-up as needed  Dr [name]

## 2018-05-03 ENCOUNTER — Encounter: Payer: Self-pay | Admitting: Family Medicine

## 2018-05-03 ENCOUNTER — Ambulatory Visit (INDEPENDENT_AMBULATORY_CARE_PROVIDER_SITE_OTHER): Payer: Medicare Other | Admitting: Family Medicine

## 2018-05-03 DIAGNOSIS — M545 Low back pain, unspecified: Secondary | ICD-10-CM | POA: Insufficient documentation

## 2018-05-03 DIAGNOSIS — R200 Anesthesia of skin: Secondary | ICD-10-CM | POA: Insufficient documentation

## 2018-05-03 LAB — CBC WITH DIFFERENTIAL/PLATELET
BASOS ABS: 0 10*3/uL (ref 0.0–0.1)
BASOS PCT: 0.5 % (ref 0.0–3.0)
EOS ABS: 0.1 10*3/uL (ref 0.0–0.7)
Eosinophils Relative: 1.3 % (ref 0.0–5.0)
HCT: 39.3 % (ref 36.0–46.0)
Hemoglobin: 13.4 g/dL (ref 12.0–15.0)
Lymphocytes Relative: 25.7 % (ref 12.0–46.0)
Lymphs Abs: 2.1 10*3/uL (ref 0.7–4.0)
MCHC: 34 g/dL (ref 30.0–36.0)
MCV: 88.9 fl (ref 78.0–100.0)
Monocytes Absolute: 0.3 10*3/uL (ref 0.1–1.0)
Monocytes Relative: 4 % (ref 3.0–12.0)
NEUTROS ABS: 5.6 10*3/uL (ref 1.4–7.7)
NEUTROS PCT: 68.5 % (ref 43.0–77.0)
PLATELETS: 267 10*3/uL (ref 150.0–400.0)
RBC: 4.42 Mil/uL (ref 3.87–5.11)
RDW: 13 % (ref 11.5–15.5)
WBC: 8.1 10*3/uL (ref 4.0–10.5)

## 2018-05-03 NOTE — Assessment & Plan Note (Signed)
Pain in hip from bursitis is improved.  Not clearly sciatic or stenosis pain causing numbness as is positional when lying on side. ? Related to nerve compression and nerve irritaion.  pt already on gabapenting for restless leg.  Eval with labs for cause of numbness.   Can consider eval of lumbar spine if not improving and lab eval negative.

## 2018-05-03 NOTE — Progress Notes (Signed)
Subjective:    Patient ID: Beth Fox, female    DOB: [birth year], 67 y.o.   MRN: [id number]  HPI  64 year ld female pt with history of prediabetes, HTN, chronic neck pain presents with new onset numbness in right leg  worse in last 2 weeks.   She reports  numbness in right leg when lying on right hip. Numbness is on lateral leg. After being on her feet for 9 hours had significant pain and numbness in hip.  No low back pain... always had some soreness in back when standing a while, vacuuming, bent over forward.. No pain radiating down leg from low back No weakness of muscles.  occ if lying on left side has numbness of left leg... Not as oftena s on right   She is on gabapentin for restless legs.  She is using 4 pills a day for this issue.  She has seen orthopedic surgeon for bursitis in right hip 1 month ago.. Treated with steroid injection and she is doing home PT. Helped some.. Was in pain constantly now only in pain when ling on it.   She is using meloxicam for plantar fasciitis in left foot. Has follow up with podiatry.   Social History /Family History/Past Medical History reviewed in detail and updated in EMR if needed. Blood pressure 128/82, pulse 77, temperature 98.4 F (36.9 C), temperature source Oral, height 5' 3.5" (1.613 m), weight 162 lb 4 oz (73.6 kg), SpO2 97 %.   Review of Systems  Constitutional: Negative for fatigue and fever.  HENT: Negative for congestion.   Eyes: Negative for pain.  Respiratory: Negative for cough and shortness of breath.   Cardiovascular: Negative for chest pain, palpitations and leg swelling.  Gastrointestinal: Negative for abdominal pain.  Genitourinary: Negative for dysuria and vaginal bleeding.  Musculoskeletal: Positive for arthralgias, back pain and neck pain.  Neurological: Positive for numbness. Negative for syncope, light-headedness and headaches.  Psychiatric/Behavioral: Negative for dysphoric mood.       Objective:   Physical Exam  Constitutional: Vital signs are normal. She appears well-developed and well-nourished. She is cooperative.  Non-toxic appearance. She does not appear ill. No distress.  HENT:  Head: Normocephalic.  Right Ear: Hearing, tympanic membrane, external ear and ear canal normal. Tympanic membrane is not erythematous, not retracted and not bulging.  Left Ear: Hearing, tympanic membrane, external ear and ear canal normal. Tympanic membrane is not erythematous, not retracted and not bulging.  Nose: No mucosal edema or rhinorrhea. Right sinus exhibits no maxillary sinus tenderness and no frontal sinus tenderness. Left sinus exhibits no maxillary sinus tenderness and no frontal sinus tenderness.  Mouth/Throat: Uvula is midline, oropharynx is clear and moist and mucous membranes are normal.  Eyes: Pupils are equal, round, and reactive to light. Conjunctivae, EOM and lids are normal. Lids are everted and swept, no foreign bodies found.  Neck: Trachea normal and normal range of motion. Neck supple. Carotid bruit is not present. No thyroid mass and no thyromegaly present.  Cardiovascular: Normal rate, regular rhythm, S1 normal, S2 normal, normal heart sounds and intact distal pulses. Exam reveals no gallop, no friction rub and no decreased pulses.  No murmur heard. Pulses:      Dorsalis pedis pulses are 2+ on the right side, and 2+ on the left side.       Posterior tibial pulses are 2+ on the right side, and 2+ on the left side.  Pulmonary/Chest: Effort normal and breath sounds normal. No  tachypnea. No respiratory distress. She has no decreased breath sounds. She has no wheezes. She has no rhonchi. She has no rales.  Abdominal: Soft. Normal appearance and bowel sounds are normal. There is no tenderness.  Musculoskeletal:       Right hip: She exhibits decreased range of motion. She exhibits normal strength and no tenderness.       Right knee: Normal.       Right ankle: Normal.       Lumbar back:  She exhibits normal range of motion, no tenderness and no bony tenderness.  Neg SLR, slight positive anterior hip pain with faber's  Neurological: She is alert. She has normal strength. No sensory deficit.  No current numbness per pt.  Skin: Skin is warm, dry and intact. No rash noted.  Psychiatric: Her speech is normal and behavior is normal. Judgment and thought content normal. Her mood appears not anxious. Cognition and memory are normal. She does not exhibit a depressed mood.          Assessment & Plan:

## 2018-05-03 NOTE — Patient Instructions (Addendum)
Please stop at the lab to have labs drawn. If continue or more low back pain.. Consider eval of lumbar spine with imaging.

## 2018-05-04 ENCOUNTER — Encounter: Payer: Self-pay | Admitting: Podiatry

## 2018-05-04 ENCOUNTER — Ambulatory Visit (INDEPENDENT_AMBULATORY_CARE_PROVIDER_SITE_OTHER): Payer: Medicare Other | Admitting: Podiatry

## 2018-05-04 DIAGNOSIS — M722 Plantar fascial fibromatosis: Secondary | ICD-10-CM

## 2018-05-04 DIAGNOSIS — M79672 Pain in left foot: Secondary | ICD-10-CM

## 2018-05-04 NOTE — Progress Notes (Signed)
She presents today for follow-up of her left heel states that there really has been no improvement since last time.  Objective: Vital signs are stable she is alert and oriented x3.  Severe pain on palpation medial calcaneal tubercle of the left heel with a small area of nodularity at the plantar fascial calcaneal insertion site that is nonpulsatile firm.  Assessment: Chronic intractable plantar fasciitis suspect a plantar fascial tear or plantar fibroma.  Plan: At this point I am requesting an MRI of the left heel to rule out plantar fascial tear.

## 2018-05-05 NOTE — Addendum Note (Signed)
Addended by: Graceann Congress D on: 05/05/2018 09:05 AM   Modules accepted: Orders

## 2018-05-17 ENCOUNTER — Ambulatory Visit
Admission: RE | Admit: 2018-05-17 | Discharge: 2018-05-17 | Disposition: A | Discharge status: Home / Self Care | Payer: Medicare Other | Source: Ambulatory Visit | Attending: Podiatry | Admitting: Podiatry

## 2018-05-17 DIAGNOSIS — M79672 Pain in left foot: Secondary | ICD-10-CM

## 2018-05-17 DIAGNOSIS — R600 Localized edema: Secondary | ICD-10-CM | POA: Diagnosis not present

## 2018-05-17 DIAGNOSIS — M722 Plantar fascial fibromatosis: Secondary | ICD-10-CM | POA: Insufficient documentation

## 2018-05-23 ENCOUNTER — Telehealth: Payer: Self-pay | Admitting: *Deleted

## 2018-05-23 NOTE — Telephone Encounter (Signed)
Pt. Calling to check on MRI results

## 2018-05-23 NOTE — Telephone Encounter (Signed)
Patient needs an appointment to discuss MRI results and treatment options

## 2018-05-25 ENCOUNTER — Encounter: Payer: Self-pay | Admitting: Podiatry

## 2018-05-25 ENCOUNTER — Ambulatory Visit (INDEPENDENT_AMBULATORY_CARE_PROVIDER_SITE_OTHER): Payer: Medicare Other | Admitting: Podiatry

## 2018-05-25 DIAGNOSIS — M722 Plantar fascial fibromatosis: Secondary | ICD-10-CM | POA: Diagnosis not present

## 2018-05-25 NOTE — Patient Instructions (Signed)
Pre-Operative Instructions  Congratulations, you have decided to take an important step towards improving your quality of life.  You can be assured that the doctors and staff at [facility] will be with you every step of the way.  Here are some important things you should know:  1. Plan to be at the surgery center/hospital at least 1 (one) hour prior to your scheduled time, unless otherwise directed by the surgical center/hospital staff.  You must have a responsible adult accompany you, remain during the surgery and drive you home.  Make sure you have directions to the surgical center/hospital to ensure you arrive on time. 2. If you are having surgery at [facility] or [facility] hospitals, you will need a copy of your medical history and physical form from your family physician within one month prior to the date of surgery. We will give you a form for your primary physician to complete.  3. We make every effort to accommodate the date you request for surgery.  However, there are times where surgery dates or times have to be moved.  We will contact you as soon as possible if a change in schedule is required.   4. No aspirin/ibuprofen for one week before surgery.  If you are on aspirin, any non-steroidal anti-inflammatory medications (Mobic, Aleve, Ibuprofen) should not be taken seven (7) days prior to your surgery.  You make take Tylenol for pain prior to surgery.  5. Medications - If you are taking daily heart and blood pressure medications, seizure, reflux, allergy, asthma, anxiety, pain or diabetes medications, make sure you notify the surgery center/hospital before the day of surgery so they can tell you which medications you should take or avoid the day of surgery. 6. No food or drink after midnight the night before surgery unless directed otherwise by surgical center/hospital staff. 7. No alcoholic beverages 24-hours prior to surgery.  No smoking 24-hours prior or 24-hours after  surgery. 8. Wear loose pants or shorts. They should be loose enough to fit over bandages, boots, and casts. 9. Don't wear slip-on shoes. Sneakers are preferred. 10. Bring your boot with you to the surgery center/hospital.  Also bring crutches or a walker if your physician has prescribed it for you.  If you do not have this equipment, it will be provided for you after surgery. 11. If you have not been contacted by the surgery center/hospital by the day before your surgery, call to confirm the date and time of your surgery. 12. Leave-time from work may vary depending on the type of surgery you have.  Appropriate arrangements should be made prior to surgery with your employer. 13. Prescriptions will be provided immediately following surgery by your doctor.  Fill these as soon as possible after surgery and take the medication as directed. Pain medications will not be refilled on weekends and must be approved by the doctor. 14. Remove nail polish on the operative foot and avoid getting pedicures prior to surgery. 15. Wash the night before surgery.  The night before surgery wash the foot and leg well with water and the antibacterial soap provided. Be sure to pay special attention to beneath the toenails and in between the toes.  Wash for at least three (3) minutes. Rinse thoroughly with water and dry well with a towel.  Perform this wash unless told not to do so by your physician.  Enclosed: 1 Ice pack (please put in freezer the night before surgery)   1 Hibiclens skin cleaner     Pre-op instructions  If you have any questions regarding the instructions, please do not hesitate to call our office.  [place]: [address], [place], [place] [address] -- [phone]  [place]: [address] [place], [place] [address] -- [phone]  [place]: [address]  [place], [place] [address] -- [phone]  [place]: [address], [address], [place],  [address] -- [phone]  Website:  [email or web address] 

## 2018-05-25 NOTE — Progress Notes (Signed)
She presents today for follow-up of her MRI.  States that her left foot is still killing her.  Objective: Vital signs are stable she is alert and oriented x3 she has pain to palpation medial and central calcaneal tubercles left.  MRI does demonstrate medial and central band plantar fascial tears.  Assessment: Tear of the plantar fascia.  Plan: Consider her today for a total endoscopic plantar fasciotomy left lateral fascia.  He understands the reason why we have to do the total.  Understands the possible complications.  Did discuss the pros and cons of surgery today including the possible postop complications which may be include but not limited to postop pain bleeding swelling infection recurrence need further surgery overcorrection under correction loss of digit loss of limb loss of life.  I will follow-up with her in the near future for surgical intervention she also received her cam walker today.

## 2018-05-26 ENCOUNTER — Telehealth: Payer: Self-pay | Admitting: *Deleted

## 2018-05-26 NOTE — Telephone Encounter (Signed)
"  I have surgery scheduled for September 27.  I just want to know if I can be put on a list if there's any cancellations that I might be scheduled sooner.  I was told if I called to let you know this, you could possibly do that."

## 2018-06-01 ENCOUNTER — Ambulatory Visit: Payer: Medicare Other | Admitting: Podiatry

## 2018-06-03 NOTE — Telephone Encounter (Signed)
I left her a message that I got her call and I will put her on the waiting list.  We do not have any cancellations at this time.

## 2018-06-04 ENCOUNTER — Other Ambulatory Visit: Payer: Self-pay | Admitting: Podiatry

## 2018-06-12 HISTORY — PX: FOOT SURGERY: SHX648

## 2018-07-05 ENCOUNTER — Telehealth: Payer: Self-pay | Admitting: *Deleted

## 2018-07-05 NOTE — Telephone Encounter (Signed)
"  I have surgery scheduled for Friday, the twenty-seventh.  I have received instructions but I have not received a time."  I called and left her a message that she will get a call from someone from the facility a day or two prior to her appointment and that they will give her the arrival time.

## 2018-07-06 ENCOUNTER — Telehealth: Payer: Self-pay | Admitting: *Deleted

## 2018-07-06 ENCOUNTER — Other Ambulatory Visit: Payer: Self-pay | Admitting: Podiatry

## 2018-07-06 MED ORDER — CEPHALEXIN 500 MG PO CAPS: 500.0000 mg | ORAL_CAPSULE | Freq: Three times a day (TID) | ORAL | 0 refills | Status: DC

## 2018-07-06 MED ORDER — ONDANSETRON HCL 4 MG PO TABS: 4.0000 mg | ORAL_TABLET | Freq: Three times a day (TID) | ORAL | 0 refills | Status: DC | PRN

## 2018-07-06 MED ORDER — OXYCODONE-ACETAMINOPHEN 10-325 MG PO TABS: 1.0000 | ORAL_TABLET | Freq: Four times a day (QID) | ORAL | 0 refills | Status: AC | PRN

## 2018-07-06 NOTE — Telephone Encounter (Addendum)
"  I'm scheduled for surgery on Friday.  I was told I would be off my foot for about two weeks.  I was wondering if I could get an order for a knee scooter.  Please give me a call."  I attempted to call the patient back.  I left her a message to call me back.  "I'm returning your call.  If you could, give me a call back."

## 2018-07-07 NOTE — Telephone Encounter (Signed)
I attempted to call her again.  I left her a message that a knee scooter is not needed.  She will be able to walk on her foot.

## 2018-07-08 ENCOUNTER — Encounter: Payer: Self-pay | Admitting: Podiatry

## 2018-07-08 DIAGNOSIS — I10 Essential (primary) hypertension: Secondary | ICD-10-CM | POA: Diagnosis not present

## 2018-07-08 DIAGNOSIS — M722 Plantar fascial fibromatosis: Secondary | ICD-10-CM | POA: Diagnosis not present

## 2018-07-08 DIAGNOSIS — M25572 Pain in left ankle and joints of left foot: Secondary | ICD-10-CM | POA: Diagnosis not present

## 2018-07-11 ENCOUNTER — Telehealth: Payer: Self-pay | Admitting: Podiatry

## 2018-07-11 NOTE — Telephone Encounter (Signed)
I had surgery on Friday and I have a question about my foot. The big toe and the toe next to it still feel a little numb and I'm still having a lot of shooting pain in the top of my foot and to those two toes. Just wondering if maybe the bandage was too tight? If you could give me a call back at [phone]. Thank you.

## 2018-07-13 ENCOUNTER — Telehealth: Payer: Self-pay | Admitting: *Deleted

## 2018-07-13 ENCOUNTER — Encounter: Payer: Self-pay | Admitting: Podiatry

## 2018-07-13 ENCOUNTER — Ambulatory Visit (INDEPENDENT_AMBULATORY_CARE_PROVIDER_SITE_OTHER): Payer: Medicare Other | Admitting: Podiatry

## 2018-07-13 DIAGNOSIS — Z9889 Other specified postprocedural states: Secondary | ICD-10-CM

## 2018-07-13 DIAGNOSIS — M722 Plantar fascial fibromatosis: Secondary | ICD-10-CM

## 2018-07-13 NOTE — Progress Notes (Signed)
She presents today for follow-up of her endoscopic plantar fasciotomy of the left foot.  She states that she is doing fine as long she continues to take her Tylenol every 4 hours for the burning pain.  She continues to wear her cam walker 24/7.  I recommended that she try to discontinue as much of the Tylenol as possible.  Objective: Vital signs are stable she is alert and oriented x3 mild ecchymosis plantar aspect of the foot incisions are intact no tenderness on palpation.  Assessment: Well-healing surgical foot.  Plan: At this point redressed today dressed a compressive dressing put her back in her Cam walker for 24/7 follow-up with her in 1 week for suture removal.

## 2018-07-13 NOTE — Telephone Encounter (Signed)
POST OP CALL-    1) General condition stated by the patient: ITS OKAY  2) Is the pt having pain? STILL NUMB A LITTLE IN THE TOES  3) Pain score:   4) Has the pt taken Rx'd pain medication, regularly or PRN? TYLENOL DAYTIME AND PAIN MEDS AT NIGHT WITH IBUPROFEN  5) Is the pain medication giving relief? YES  6) Any fever, chills, nausea, or vomiting, shortness of breath or tightness in calf? NO  7) Is the bandage clean, dry and intact? YES  8) Is there excessive tightness, bleeding or drainage coming through the bandage? NO  9) Did you understand all of the post op instruction sheet given? YES  10) Any questions or concerns regarding post op care/recovery? CONCERNED ABOUT NUMB TOES AND THINKING BANDAGE WAS TOO TIGHT - ADVISED SHE COULD LOOSEN ACE WRAP AND REWRAP A LITTLE LOOSER AND SEE IF THAT HELPED    Confirmed POV appointment with patient

## 2018-07-14 ENCOUNTER — Telehealth: Payer: Self-pay | Admitting: Podiatry

## 2018-07-14 NOTE — Progress Notes (Signed)
DOS: 07-08-2018 Total Endoscopic Plantar Fasciotomy LT   [facility]

## 2018-07-14 NOTE — Telephone Encounter (Signed)
Unable to understand anything pt said in the message other than her name and phone number.

## 2018-07-19 NOTE — Telephone Encounter (Signed)
I spoke with patient, she reported that she was having some nausea with taking her antibiotic, but has now subsided.  I instructed her to make sure she finishes all antibiotic.  She stated that she is still having a lot of burning pains but is doing ok.  I instructed her to keep elevated, use ice and take her pain medication as directed and if pain gets worse, then she can call office to see if we can get her sooner than her regular follow up appt.   She verbalized instructions

## 2018-07-25 ENCOUNTER — Ambulatory Visit (INDEPENDENT_AMBULATORY_CARE_PROVIDER_SITE_OTHER): Payer: Medicare Other | Admitting: Podiatry

## 2018-07-25 ENCOUNTER — Encounter: Payer: Self-pay | Admitting: Podiatry

## 2018-07-25 DIAGNOSIS — Z9889 Other specified postprocedural states: Secondary | ICD-10-CM

## 2018-07-25 NOTE — Progress Notes (Signed)
She presents today for her postop visit date of surgery 07/08/2018 status post EPF left foot.  States that she is doing quite well.  Continues to wear her cam walker.  Objective: Vital signs are stable she is alert and oriented times 3 sutures were removed today margins are well coapted she has no tenderness on palpation the medial calcaneal tubercle.  No tenderness on palpation of the incision sites.  No purulence no erythema no malodor.  Assessment: Well-healing endoscopic plantar fasciotomy.  Plan: I would like her to stay out of work for at least 1 more week if she transitions into her tennis shoes.  I would then encourage her to continue the use of her night splint for the next month.  We will follow-up with her in 1 month from now.

## 2018-07-30 DIAGNOSIS — Z23 Encounter for immunization: Secondary | ICD-10-CM | POA: Diagnosis not present

## 2018-08-01 ENCOUNTER — Encounter: Payer: Self-pay | Admitting: *Deleted

## 2018-08-15 ENCOUNTER — Other Ambulatory Visit: Payer: Self-pay | Admitting: Family Medicine

## 2018-08-15 DIAGNOSIS — Z1231 Encounter for screening mammogram for malignant neoplasm of breast: Secondary | ICD-10-CM

## 2018-08-17 DIAGNOSIS — H524 Presbyopia: Secondary | ICD-10-CM | POA: Diagnosis not present

## 2018-08-17 DIAGNOSIS — H2513 Age-related nuclear cataract, bilateral: Secondary | ICD-10-CM | POA: Diagnosis not present

## 2018-08-24 ENCOUNTER — Ambulatory Visit (INDEPENDENT_AMBULATORY_CARE_PROVIDER_SITE_OTHER): Payer: Medicare Other | Admitting: Podiatry

## 2018-08-24 ENCOUNTER — Encounter: Payer: Self-pay | Admitting: Podiatry

## 2018-08-24 DIAGNOSIS — Z9889 Other specified postprocedural states: Secondary | ICD-10-CM

## 2018-08-24 DIAGNOSIS — M722 Plantar fascial fibromatosis: Secondary | ICD-10-CM

## 2018-08-24 NOTE — Progress Notes (Signed)
She presents today date of surgery 07/08/2018 status post endoscopic plantar fasciotomy left foot states that is doing much better.  She states that been wearing shoe to work and I had no problem other than some soreness.  Objective: Vital signs are stable alert and oriented x3 left foot demonstrates no pain on palpation medial calcaneal tubercle of the left heel.  No erythema edema cellulitis drainage or odor no open lesions or wounds.  Assessment: Pain in limb resolving status post EPF left.  Plan: Follow-up with [place] on an as-needed basis.  She is released today.

## 2018-09-04 ENCOUNTER — Other Ambulatory Visit: Payer: Self-pay | Admitting: Family Medicine

## 2018-09-04 NOTE — Telephone Encounter (Signed)
Last office visit 05/03/18 for bilateral leg numbness.  Last refilled 611/19 for #270 with 1 refill.  CPE scheduled for 09/20/18

## 2018-09-14 ENCOUNTER — Other Ambulatory Visit: Payer: Self-pay | Admitting: Family Medicine

## 2018-09-14 ENCOUNTER — Telehealth: Payer: Self-pay | Admitting: Family Medicine

## 2018-09-14 DIAGNOSIS — I10 Essential (primary) hypertension: Secondary | ICD-10-CM

## 2018-09-14 DIAGNOSIS — E7800 Pure hypercholesterolemia, unspecified: Secondary | ICD-10-CM

## 2018-09-14 DIAGNOSIS — R7309 Other abnormal glucose: Secondary | ICD-10-CM

## 2018-09-14 DIAGNOSIS — R200 Anesthesia of skin: Secondary | ICD-10-CM

## 2018-09-14 DIAGNOSIS — E559 Vitamin D deficiency, unspecified: Secondary | ICD-10-CM

## 2018-09-14 DIAGNOSIS — Z0000 Encounter for general adult medical examination without abnormal findings: Secondary | ICD-10-CM

## 2018-09-14 NOTE — Telephone Encounter (Signed)
-----   Message from Eustace Pen, LPN sent at 36/14/4315  3:48 PM EST ----- Regarding: Labs 12/5 Lab orders needed. Thank you.  Insurance:  Medicare  Please note there are already some future labs ordered.

## 2018-09-15 ENCOUNTER — Ambulatory Visit (INDEPENDENT_AMBULATORY_CARE_PROVIDER_SITE_OTHER): Payer: Medicare Other

## 2018-09-15 VITALS — BP 116/80 | HR 81 | Temp 98.6°F | Ht 63.75 in | Wt 146.5 lb

## 2018-09-15 DIAGNOSIS — Z23 Encounter for immunization: Secondary | ICD-10-CM | POA: Diagnosis not present

## 2018-09-15 DIAGNOSIS — E559 Vitamin D deficiency, unspecified: Secondary | ICD-10-CM

## 2018-09-15 DIAGNOSIS — E7800 Pure hypercholesterolemia, unspecified: Secondary | ICD-10-CM | POA: Diagnosis not present

## 2018-09-15 DIAGNOSIS — I10 Essential (primary) hypertension: Secondary | ICD-10-CM

## 2018-09-15 DIAGNOSIS — R200 Anesthesia of skin: Secondary | ICD-10-CM

## 2018-09-15 DIAGNOSIS — Z0000 Encounter for general adult medical examination without abnormal findings: Secondary | ICD-10-CM

## 2018-09-15 DIAGNOSIS — R7309 Other abnormal glucose: Secondary | ICD-10-CM | POA: Diagnosis not present

## 2018-09-15 LAB — COMPREHENSIVE METABOLIC PANEL
ALT: 29 U/L (ref 0–35)
AST: 24 U/L (ref 0–37)
Albumin: 4.5 g/dL (ref 3.5–5.2)
Alkaline Phosphatase: 72 U/L (ref 39–117)
BUN: 22 mg/dL (ref 6–23)
CO2: 32 mEq/L (ref 19–32)
Calcium: 9.8 mg/dL (ref 8.4–10.5)
Chloride: 102 mEq/L (ref 96–112)
Creatinine, Ser: 1.11 mg/dL (ref 0.40–1.20)
GFR: 52.19 mL/min — ABNORMAL LOW (ref >60.00)
GLUCOSE: 103 mg/dL — AB (ref 70–99)
Potassium: 3.2 mEq/L — ABNORMAL LOW (ref 3.5–5.1)
Sodium: 142 mEq/L (ref 135–145)
Total Bilirubin: 0.4 mg/dL (ref 0.2–1.2)
Total Protein: 7.2 g/dL (ref 6.0–8.3)

## 2018-09-15 LAB — CBC WITH DIFFERENTIAL/PLATELET
Basophils Absolute: 0 10*3/uL (ref 0.0–0.1)
Basophils Relative: 0.5 % (ref 0.0–3.0)
Eosinophils Absolute: 0 10*3/uL (ref 0.0–0.7)
Eosinophils Relative: 0.7 % (ref 0.0–5.0)
HEMATOCRIT: 41.2 % (ref 36.0–46.0)
Hemoglobin: 14 g/dL (ref 12.0–15.0)
LYMPHS PCT: 17 % (ref 12.0–46.0)
Lymphs Abs: 0.9 10*3/uL (ref 0.7–4.0)
MCHC: 34 g/dL (ref 30.0–36.0)
MCV: 91.9 fl (ref 78.0–100.0)
Monocytes Absolute: 0.5 10*3/uL (ref 0.1–1.0)
Monocytes Relative: 10 % (ref 3.0–12.0)
NEUTROS ABS: 3.9 10*3/uL (ref 1.4–7.7)
NEUTROS PCT: 71.8 % (ref 43.0–77.0)
Platelets: 194 10*3/uL (ref 150.0–400.0)
RBC: 4.48 Mil/uL (ref 3.87–5.11)
RDW: 12 % (ref 11.5–15.5)
WBC: 5.4 10*3/uL (ref 4.0–10.5)

## 2018-09-15 LAB — HEMOGLOBIN A1C: HEMOGLOBIN A1C: 5.7 % (ref 4.6–6.5)

## 2018-09-15 LAB — TSH: TSH: 0.53 u[IU]/mL (ref 0.35–4.50)

## 2018-09-15 LAB — LIPID PANEL
Cholesterol: 160 mg/dL (ref 0–200)
HDL: 39.7 mg/dL (ref >39.00)
LDL Cholesterol: 105 mg/dL — ABNORMAL HIGH (ref 0–99)
NONHDL: 120.53
Total CHOL/HDL Ratio: 4
Triglycerides: 76 mg/dL (ref 0.0–149.0)
VLDL: 15.2 mg/dL (ref 0.0–40.0)

## 2018-09-15 LAB — VITAMIN B12: Vitamin B-12: 230 pg/mL (ref 211–911)

## 2018-09-15 LAB — VITAMIN D 25 HYDROXY (VIT D DEFICIENCY, FRACTURES): VITD: 21.9 ng/mL — ABNORMAL LOW (ref 30.00–100.00)

## 2018-09-15 NOTE — Progress Notes (Signed)
PCP notes:   Health maintenance:  PPSV23 - administered  Abnormal screenings:   None  Patient concerns:   None  Nurse concerns:  None  Next PCP appt:   09/20/18 @ 0915

## 2018-09-15 NOTE — Progress Notes (Signed)
Subjective:   Beth Fox is a 67 y.o. female who presents for an Initial Medicare Annual Wellness Visit.  Review of Systems    N/A  Cardiac Risk Factors include: advanced age (>90men, >18 women);dyslipidemia;hypertension     Objective:    Today's Vitals   09/15/18 0813  BP: 116/80  Pulse: 81  Temp: 98.6 F (37 C)  TempSrc: Oral  SpO2: 97%  Weight: 146 lb 8 oz (66.5 kg)  Height: 5' 3.75" (1.619 m)  PainSc: 0-No pain   Body mass index is 25.34 kg/m.  Advanced Directives 09/15/2018  Does Patient Have a Medical Advance Directive? Yes  Type of Paramedic of [place];Living will  Copy of [place] in Chart? Yes - validated most recent copy scanned in chart (See row information)    Current Medications (verified) Outpatient Encounter Medications as of 09/15/2018  Medication Sig  . gabapentin (NEURONTIN) 100 MG capsule TAKE 1 CAPSULE BY MOUTH 3 TIMES DAILY. START AT 100 MG AT BEDTIME & GRADUALLY INCREASE TO 3XDAY  . hydrochlorothiazide (HYDRODIURIL) 25 MG tablet TAKE 1 TABLET BY MOUTH EVERY DAY  . omeprazole (PRILOSEC) 20 MG capsule Take 1 capsule (20 mg total) by mouth daily.  . Probiotic Product (PROBIOTIC-10 PO) Take 1 tablet by mouth daily.  . [DISCONTINUED] Influenza vac split quadrivalent PF (FLUARIX) 0.5 ML injection Fluarix Quad 2017-2018 (PF) 60 mcg (15 mcg x 4)/0.5 mL IM syringe  TO BE ADMINISTERED BY PHARMACIST FOR IMMUNIZATION  . [DISCONTINUED] diclofenac (VOLTAREN) 75 MG EC tablet diclofenac sodium 75 mg tablet,delayed release  TAKE 1 TABLET BY MOUTH TWICE A DAY  . [DISCONTINUED] meloxicam (MOBIC) 15 MG tablet TAKE 1 TABLET BY MOUTH EVERY DAY  . [DISCONTINUED] ondansetron (ZOFRAN) 4 MG tablet Take 1 tablet (4 mg total) by mouth every 8 (eight) hours as needed for nausea or vomiting.  . [DISCONTINUED] sertraline (ZOLOFT) 100 MG tablet sertraline 100 mg tablet   No facility-administered encounter medications on file  as of 09/15/2018.     Allergies (verified) Patient has no known allergies.   History: Past Medical History:  Diagnosis Date  . 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 APPENDECTOMY    . BREAST SURGERY     mastectomy on right   . FOOT SURGERY Left 06/2018   Dr. Milinda Pointer  . MASTECTOMY Right Oct 1997   c Reconstruction surgery on Right Breast  . REDUCTION MAMMAPLASTY Left 2012  . TUBAL LIGATION     Family History  Problem Relation Age of Onset  . Diabetes Mother   . Hypertension Mother   . Stroke Mother   . Cancer Father        melonoma  . Diabetes Father   . Hypertension Father   . Parkinsonism Father   . Breast cancer Maternal Aunt   . Breast cancer Paternal Aunt   . Breast cancer Cousin   . Hypertension Sister   . Arthritis Sister   . Stroke Maternal Grandmother   . Stroke Paternal Grandmother   . Heart attack Paternal Grandfather        before age 42  . Hypertension Sister   . Arthritis Sister   . Breast cancer Cousin    Social History   Socioeconomic History  . Marital status: Married    Spouse name: Not on file  . Number of children: 0  . Years of education: Not on  file  . Highest education level: Not on file  Occupational History  . Occupation: Scientist, clinical (histocompatibility and immunogenetics): [employer]  Social Needs  . Financial resource strain: Not on file  . Food insecurity:    Worry: Not on file    Inability: Not on file  . Transportation needs:    Medical: Not on file    Non-medical: Not on file  Tobacco Use  . Smoking status: Never Smoker  . Smokeless tobacco: Never Used  Substance and Sexual Activity  . Alcohol use: No    Alcohol/week: 0.0 standard drinks  . Drug use: No  . Sexual activity: Not on file  Lifestyle  . Physical activity:    Days per week: Not on file    Minutes per session: Not on file  . Stress: Not on file  Relationships  . Social connections:    Talks on phone: Not on file     Gets together: Not on file    Attends religious service: Not on file    Active member of club or organization: Not on file    Attends meetings of clubs or organizations: Not on file    Relationship status: Not on file  Other Topics Concern  . Not on file  Social History Narrative   Regular exercise--no   Diet: fast food and sweets    Tobacco Counseling Counseling given: No   Clinical Intake:  Pre-visit preparation completed: Yes  Pain : No/denies pain Pain Score: 0-No pain     Nutritional Status: BMI 25 -29 Overweight Nutritional Risks: None Diabetes: No  How often do you need to have someone help you when you read instructions, pamphlets, or other written materials from your doctor or pharmacy?: 1 - Never What is the last grade level you completed in school?: 12th grade  Interpreter Needed?: No  Comments: pt is a widow and lives alone Information entered by :: LPinson, LPN   Activities of Daily Living In your present state of health, do you have any difficulty performing the following activities: 09/15/2018  Hearing? N  Vision? N  Difficulty concentrating or making decisions? N  Walking or climbing stairs? N  Dressing or bathing? N  Doing errands, shopping? N  Preparing Food and eating ? N  Using the Toilet? N  In the past six months, have you accidently leaked urine? N  Do you have problems with loss of bowel control? N  Managing your Medications? N  Managing your Finances? N  Housekeeping or managing your Housekeeping? N  Some recent data might be hidden     Immunizations and Health Maintenance Immunization History  Administered Date(s) Administered  . Influenza Whole 08/08/2009, 09/09/2010  . Influenza, High Dose Seasonal PF 07/30/2018  . Influenza,inj,Quad PF,6+ Mos 08/29/2013, 11/27/2014, 08/02/2015, 08/12/2017  . Influenza,inj,quad, With Preservative 07/18/2016  . Pneumococcal Polysaccharide-23 09/15/2018  . Tdap 01/06/2013   There are no  preventive care reminders to display for this patient.  Patient Care Team: Jinny Sanders, MD as PCP - General  Indicate any recent Medical Services you may have received from other than [facility] providers in the past year (date may be approximate).     Assessment:   This is a routine wellness examination for Beth Fox.  Hearing/Vision screen  Hearing Screening   125Hz  250Hz  500Hz  1000Hz  2000Hz  3000Hz  4000Hz  6000Hz  8000Hz   Right ear:   40 40 40  40    Left ear:   40 40 40  40  Vision Screening Comments: Vision exam in Nov 2019 with Dr. Kerin Ransom  Dietary issues and exercise activities discussed: Current Exercise Habits: The patient does not participate in regular exercise at present, Exercise limited by: None identified  Goals    . Patient Stated     Starting 09/15/2018, I will continue to take medications as prescribed.       Depression Screen PHQ 2/9 Scores 09/15/2018 08/12/2017  PHQ - 2 Score 0 0  PHQ- 9 Score 0 -    Fall Risk Fall Risk  09/15/2018 08/12/2017 05/14/2017  Falls in the past year? 0 No No    Cognitive Function: MMSE - Mini Mental State Exam 09/15/2018  Orientation to time 5  Orientation to Place 5  Registration 3  Attention/ Calculation 0  Recall 3  Language- name 2 objects 0  Language- repeat 1  Language- follow 3 step command 3  Language- read & follow direction 0  Write a sentence 0  Copy design 0  Total score 20     PLEASE NOTE: A Mini-Cog screen was completed. Maximum score is 20. A value of 0 denotes this part of [place] MMSE was not completed or the patient failed this part of the Mini-Cog screening.   Mini-Cog Screening Orientation to Time - Max 5 pts Orientation to Place - Max 5 pts Registration - Max 3 pts Recall - Max 3 pts Language Repeat - Max 1 pts Language Follow 3 Step Command - Max 3 pts     Screening Tests Health Maintenance  Topic Date Due  . PNA vac Low Risk Adult (2 of 2 - PCV13) 09/16/2019  . MAMMOGRAM  09/22/2019  . Fecal  DNA (Cologuard)  08/25/2020  . TETANUS/TDAP  01/07/2023  . INFLUENZA VACCINE  Completed  . DEXA SCAN  Completed  . Hepatitis C Screening  Completed     Plan:     I have personally reviewed, addressed, and noted the following in the patient's chart:  A. Medical and social history B. Use of alcohol, tobacco or illicit drugs  C. Current medications and supplements D. Functional ability and status E.  Nutritional status F.  Physical activity G. Advance directives H. List of other physicians I.  Hospitalizations, surgeries, and ER visits in previous 12 months J.  [place] to include hearing, vision, cognitive, depression L. Referrals and appointments - none  In addition, I have reviewed and discussed with patient certain preventive protocols, quality metrics, and best practice recommendations. A written personalized care plan for preventive services as well as general preventive health recommendations were provided to patient.  See attached scanned questionnaire for additional information.   Signed,   Lindell Noe, MHA, BS, LPN Health Coach

## 2018-09-15 NOTE — Patient Instructions (Addendum)
Ms. Willenbring , Thank you for taking time to come for your Medicare Wellness Visit. I appreciate your ongoing commitment to your health goals. Please review the following plan we discussed and let me know if I can assist you in the future.   These are the goals we discussed: Goals    . Patient Stated     Starting 09/15/2018, I will continue to take medications as prescribed.        This is a list of the screening recommended for you and due dates:  Health Maintenance  Topic Date Due  . Pneumonia vaccines (2 of 2 - PCV13) 09/16/2019  . Mammogram  09/22/2019  . Cologuard (Stool DNA test)  08/25/2020  . Tetanus Vaccine  01/07/2023  . Flu Shot  Completed  . DEXA scan (bone density measurement)  Completed  .  Hepatitis C: One time screening is recommended by Center for Disease Control  (CDC) for  adults born from [birth year] through 1964.   Completed     Preventive Care for Adults  A healthy lifestyle and preventive care can promote health and wellness. Preventive health guidelines for adults include the following key practices.  . A routine yearly physical is a good way to check with your health care provider about your health and preventive screening. It is a chance to share any concerns and updates on your health and to receive a thorough exam.  . Visit your dentist for a routine exam and preventive care every 6 months. Brush your teeth twice a day and floss once a day. Good oral hygiene prevents tooth decay and gum disease.  . The frequency of eye exams is based on your age, health, family medical history, use  of contact lenses, and other factors. Follow your health care provider's recommendations for frequency of eye exams.  . Eat a healthy diet. Foods like vegetables, fruits, whole grains, low-fat dairy products, and lean protein foods contain the nutrients you need without too many calories. Decrease your intake of foods high in solid fats, added sugars, and salt. Eat the right amount of  calories for you. Get information about a proper diet from your health care provider, if necessary.  . Regular physical exercise is one of the most important things you can do for your health. Most adults should get at least 150 minutes of moderate-intensity exercise (any activity that increases your heart rate and causes you to sweat) each week. In addition, most adults need muscle-strengthening exercises on 2 or more days a week.  Silver Sneakers may be a benefit available to you. To determine eligibility, you may visit the website: www.silversneakers.com or contact program at 303-735-6324 Mon-Fri between 8AM-8PM.   . Maintain a healthy weight. The body mass index (BMI) is a screening tool to identify possible weight problems. It provides an estimate of body fat based on height and weight. Your health care provider can find your BMI and can help you achieve or maintain a healthy weight.   For adults 20 years and older: ? A BMI below 18.5 is considered underweight. ? A BMI of 18.5 to 24.9 is normal. ? A BMI of 25 to 29.9 is considered overweight. ? A BMI of 30 and above is considered obese.   . Maintain normal blood lipids and cholesterol levels by exercising and minimizing your intake of saturated fat. Eat a balanced diet with plenty of fruit and vegetables. Blood tests for lipids and cholesterol should begin at age 37 and be repeated every  5 years. If your lipid or cholesterol levels are high, you are over 50, or you are at high risk for heart disease, you may need your cholesterol levels checked more frequently. Ongoing high lipid and cholesterol levels should be treated with medicines if diet and exercise are not working.  . If you smoke, find out from your health care provider how to quit. If you do not use tobacco, please do not start.  . If you choose to drink alcohol, please do not consume more than 2 drinks per day. One drink is considered to be 12 ounces (355 mL) of beer, 5 ounces  (148 mL) of wine, or 1.5 ounces (44 mL) of liquor.  . If you are 14-3 years old, ask your health care provider if you should take aspirin to prevent strokes.  . Use sunscreen. Apply sunscreen liberally and repeatedly throughout the day. You should seek shade when your shadow is shorter than you. Protect yourself by wearing long sleeves, pants, a wide-brimmed hat, and sunglasses year round, whenever you are outdoors.  . Once a month, do a whole body skin exam, using a mirror to look at the skin on your back. Tell your health care provider of new moles, moles that have irregular borders, moles that are larger than a pencil eraser, or moles that have changed in shape or color.

## 2018-09-16 NOTE — Progress Notes (Signed)
I reviewed health advisor's note, was available for consultation, and agree with documentation and plan.  

## 2018-09-20 ENCOUNTER — Ambulatory Visit (INDEPENDENT_AMBULATORY_CARE_PROVIDER_SITE_OTHER): Payer: Medicare Other | Admitting: Family Medicine

## 2018-09-20 ENCOUNTER — Encounter: Payer: Self-pay | Admitting: Family Medicine

## 2018-09-20 VITALS — BP 120/70 | HR 81 | Temp 97.8°F | Ht 63.75 in | Wt 149.5 lb

## 2018-09-20 DIAGNOSIS — R7309 Other abnormal glucose: Secondary | ICD-10-CM

## 2018-09-20 DIAGNOSIS — Z853 Personal history of malignant neoplasm of breast: Secondary | ICD-10-CM | POA: Diagnosis not present

## 2018-09-20 DIAGNOSIS — I10 Essential (primary) hypertension: Secondary | ICD-10-CM

## 2018-09-20 DIAGNOSIS — M8580 Other specified disorders of bone density and structure, unspecified site: Secondary | ICD-10-CM

## 2018-09-20 DIAGNOSIS — E7800 Pure hypercholesterolemia, unspecified: Secondary | ICD-10-CM

## 2018-09-20 DIAGNOSIS — E559 Vitamin D deficiency, unspecified: Secondary | ICD-10-CM

## 2018-09-20 DIAGNOSIS — F325 Major depressive disorder, single episode, in full remission: Secondary | ICD-10-CM | POA: Diagnosis not present

## 2018-09-20 MED ORDER — VITAMIN D3 1.25 MG (50000 UT) PO CAPS: 1.0000 | ORAL_CAPSULE | ORAL | 3 refills | Status: DC

## 2018-09-20 NOTE — Assessment & Plan Note (Signed)
Yearly CBE.

## 2018-09-20 NOTE — Assessment & Plan Note (Signed)
Will do 1 year of vit D weekly supplementation.

## 2018-09-20 NOTE — Assessment & Plan Note (Signed)
Stable control off medication at this time.

## 2018-09-20 NOTE — Patient Instructions (Addendum)
Get back to regular exercise as able.. Goal 3-5 days a week for 150 min total.  Work on healthy eating.. Getting lean protein and fruits/veggies.  Start back B12 1000 mcg daily.

## 2018-09-20 NOTE — Assessment & Plan Note (Signed)
Replete vit D. Get back to exercise.

## 2018-09-20 NOTE — Progress Notes (Signed)
Subjective:    Patient ID: Beth Fox, female    DOB: Mar 10, 1951, 67 y.o.   MRN: [id number]  HPI The patient presents for  complete physical and review of chronic health problems. He/She also has the following acute concerns today:  The patient saw Candis Musa, LPN for medicare wellness. Note reviewed in detail and important notes copied below.  Health maintenance:  PPSV23 - administered  Abnormal screenings:   None  Patient concerns:   None  Nurse concerns:  09/20/18  Today  Hypertension:   Good control, at goal < 150/90 on HCTZ Using medication without problems or lightheadedness:  occ when not eating Chest pain with exertion: none Edema:none Short of breath:none Average home BPs: not checking. Other issues:  Elevated Cholesterol:  Improved control  ith diet changes.. Not eating much, healthy snacking Lab Results  Component Value Date   CHOL 160 09/15/2018   HDL 39.70 09/15/2018   LDLCALC 105 (H) 09/15/2018   LDLDIRECT 167.7 09/01/2013   TRIG 76.0 09/15/2018   CHOLHDL 4 09/15/2018   Using medications without problems: Muscle aches:  Diet compliance: Good. Exercise: None.. Will start with retirement in 10/11/18 Other complaints:  Has lost 12 lbs in  Last year. Wt Readings from Last 3 Encounters:  09/20/18 149 lb 8 oz (67.8 kg)  09/15/18 146 lb 8 oz (66.5 kg)  05/03/18 162 lb 4 oz (73.6 kg)     Prediabetes  Lab Results  Component Value Date   HGBA1C 5.7 09/15/2018    Vit D: low  MDD recurrent severe: stable control  No longer on sertraline.  Depression screen [facility] 2/9 09/15/2018 08/12/2017  Decreased Interest 0 0  Down, Depressed, Hopeless 0 0  PHQ - 2 Score 0 0  Altered sleeping 0 -  Tired, decreased energy 0 -  Change in appetite 0 -  Feeling bad or failure about yourself  0 -  Trouble concentrating 0 -  Moving slowly or fidgety/restless 0 -  Suicidal thoughts 0 -  PHQ-9 Score 0 -  Difficult doing work/chores Not  difficult at all -    Social History /Family History/Past Medical History reviewed in detail and updated in EMR if needed. Blood pressure 120/70, pulse 81, temperature 97.8 F (36.6 C), temperature source Oral, height 5' 3.75" (1.619 m), weight 149 lb 8 oz (67.8 kg).   Review of Systems  Constitutional: Negative for fatigue and fever.  HENT: Negative for congestion.   Eyes: Negative for pain.  Respiratory: Negative for cough and shortness of breath.   Cardiovascular: Negative for chest pain, palpitations and leg swelling.  Gastrointestinal: Negative for abdominal pain.  Genitourinary: Negative for dysuria and vaginal bleeding.  Musculoskeletal: Negative for back pain.  Neurological: Negative for syncope, light-headedness and headaches.  Psychiatric/Behavioral: Negative for dysphoric mood.       Objective:   Physical Exam  Constitutional: Vital signs are normal. She appears well-developed and well-nourished. She is cooperative.  Non-toxic appearance. She does not appear ill. No distress.  HENT:  Head: Normocephalic.  Right Ear: Hearing, tympanic membrane, external ear and ear canal normal.  Left Ear: Hearing, tympanic membrane, external ear and ear canal normal.  Nose: Nose normal.  Eyes: Pupils are equal, round, and reactive to light. Conjunctivae, EOM and lids are normal. Lids are everted and swept, no foreign bodies found.  Neck: Trachea normal and normal range of motion. Neck supple. Carotid bruit is not present. No thyroid mass and no thyromegaly present.  Cardiovascular: Normal rate,  regular rhythm, S1 normal, S2 normal, normal heart sounds and intact distal pulses. Exam reveals no gallop.  No murmur heard. Pulmonary/Chest: Effort normal and breath sounds normal. No respiratory distress. She has no wheezes. She has no rhonchi. She has no rales. Left breast exhibits no inverted nipple, no mass, no nipple discharge, no skin change and no tenderness.  S/P right mastectomy    Abdominal: Soft. Normal appearance and bowel sounds are normal. She exhibits no distension, no fluid wave, no abdominal bruit and no mass. There is no hepatosplenomegaly. There is no tenderness. There is no rebound, no guarding and no CVA tenderness. No hernia.  Lymphadenopathy:    She has no cervical adenopathy.    She has no axillary adenopathy.  Neurological: She is alert. She has normal strength. No cranial nerve deficit or sensory deficit.  Skin: Skin is warm, dry and intact. No rash noted.  Psychiatric: Her speech is normal and behavior is normal. Judgment normal. Her mood appears not anxious. Cognition and memory are normal. She does not exhibit a depressed mood.          Assessment & Plan:  The patient's preventative maintenance and recommended screening tests for an annual wellness exam were reviewed in full today. Brought up to date unless services declined.  Counselled on the importance of diet, exercise, and its role in overall health and mortality. The patient's FH and SH was reviewed, including their home life, tobacco status, and drug and alcohol status.   Vaccines: Refused shingles. Uptodate with tdap, flu, pna23 Colon:Not sure when last colonoscopy done, likely over 10 years but was nml. She has opted for cologuard.. Negative in 20-18 DEXA: No family history of osteoporosis, got menses average, menopause age 38, no steroid use. She is moderate risk   last done 2016, osteopenia repeat in 5 years. Mammo:stable 09/2018 , personal history of breast cancer..  Needs yearly CBE. Has scheduled mammo  This week .Marland Kitchen Asymptomatic and low risk for ovarian or uterine cancer. Pap/DVE: nml pap, neg HPV in 12/2012, no history of abnormal paps no further indicated  Nonsmoker. HEP C:  done

## 2018-09-20 NOTE — Assessment & Plan Note (Signed)
Well controlled. Continue current medication.  

## 2018-09-20 NOTE — Assessment & Plan Note (Signed)
Encouraged exercise, weight loss, healthy eating habits. ? ?

## 2018-09-22 ENCOUNTER — Ambulatory Visit
Admission: RE | Admit: 2018-09-22 | Discharge: 2018-09-22 | Disposition: A | Discharge status: Home / Self Care | Payer: Medicare Other | Source: Ambulatory Visit | Attending: Family Medicine | Admitting: Family Medicine

## 2018-09-22 DIAGNOSIS — Z1231 Encounter for screening mammogram for malignant neoplasm of breast: Secondary | ICD-10-CM | POA: Diagnosis not present

## 2018-09-22 HISTORY — DX: Personal history of antineoplastic chemotherapy: Z92.21

## 2018-10-14 ENCOUNTER — Ambulatory Visit (INDEPENDENT_AMBULATORY_CARE_PROVIDER_SITE_OTHER): Payer: Medicare Other | Admitting: Family Medicine

## 2018-10-14 ENCOUNTER — Encounter: Payer: Self-pay | Admitting: Family Medicine

## 2018-10-14 VITALS — BP 134/86 | HR 85 | Temp 98.6°F | Ht 63.75 in | Wt 142.8 lb

## 2018-10-14 DIAGNOSIS — J22 Unspecified acute lower respiratory infection: Secondary | ICD-10-CM | POA: Insufficient documentation

## 2018-10-14 MED ORDER — BENZONATATE 100 MG PO CAPS: 100.0000 mg | ORAL_CAPSULE | Freq: Three times a day (TID) | ORAL | 0 refills | Status: DC | PRN

## 2018-10-14 MED ORDER — AZITHROMYCIN 250 MG PO TABS: ORAL_TABLET | ORAL | 0 refills | Status: DC

## 2018-10-14 NOTE — Patient Instructions (Addendum)
I think you have bronchitis but likely viral - should improve over time. Treat cough with tessalon perls, honey with lemon, herbal teas.  May try ibuprofen 400mg  with meals for next few days.  Push fluids and rest. If fever >101, worsening productive cough, or not improving with treatment above, may fill antibiotic zpack prescription printed out today.

## 2018-10-14 NOTE — Progress Notes (Signed)
BP 134/86 (BP Location: Left Arm, Patient Position: Sitting, Cuff Size: Normal)   Pulse 85   Temp 98.6 F (37 C) (Oral)   Ht 5' 3.75" (1.619 m)   Wt 142 lb 12 oz (64.8 kg)   SpO2 98%   BMI 24.70 kg/m    CC: cough Subjective:    Patient ID: Beth Fox, female    DOB: April 04, 1951, 68 y.o.   MRN: [id number]  HPI: Beth Fox is a 68 y.o. female presenting on 10/14/2018 for Cough (C/o productive cough- worse at night and nasal drainage. Sxs started 10/06/18. Tried Mucinex, OTC antihistamine and cold meds. States this is the second bout. Has same sxs at end of Nov 2019. )   1+ wk h/o productive cough, rhinorrhea. Cough not letting her sleep at night time. PNdrainage. Chest > head congestion.   No fevers/chills, ear pain or tooth pain, dyspnea or wheezing, ST or HA.   Treating with mucinex, nyquil, antihistamines without benefit.  Second bout - similar symptoms 08/2018. Did fully improve initially.  ++ sick contacts at home - bronchitis and URI No smokers at home.  No h/o asthma.      Relevant past medical, surgical, family and social history reviewed and updated as indicated. Interim medical history since our last visit reviewed. Allergies and medications reviewed and updated. Outpatient Medications Prior to Visit  Medication Sig Dispense Refill  . Cholecalciferol (VITAMIN D3) 1.25 MG (50000 UT) CAPS Take 1 capsule by mouth once a week. 12 capsule 3  . gabapentin (NEURONTIN) 100 MG capsule TAKE 1 CAPSULE BY MOUTH 3 TIMES DAILY. START AT 100 MG AT BEDTIME & GRADUALLY INCREASE TO 3XDAY 270 capsule 1  . hydrochlorothiazide (HYDRODIURIL) 25 MG tablet TAKE 1 TABLET BY MOUTH EVERY DAY 90 tablet 0  . omeprazole (PRILOSEC) 20 MG capsule Take 1 capsule (20 mg total) by mouth daily. 30 capsule 11  . Probiotic Product (PROBIOTIC-10 PO) Take 1 tablet by mouth daily.     No facility-administered medications prior to visit.      Per HPI unless specifically indicated in ROS  section below Review of Systems Objective:    BP 134/86 (BP Location: Left Arm, Patient Position: Sitting, Cuff Size: Normal)   Pulse 85   Temp 98.6 F (37 C) (Oral)   Ht 5' 3.75" (1.619 m)   Wt 142 lb 12 oz (64.8 kg)   SpO2 98%   BMI 24.70 kg/m   Wt Readings from Last 3 Encounters:  10/14/18 142 lb 12 oz (64.8 kg)  09/20/18 149 lb 8 oz (67.8 kg)  09/15/18 146 lb 8 oz (66.5 kg)    Physical Exam Vitals signs and nursing note reviewed.  Constitutional:      General: She is not in acute distress.    Appearance: She is well-developed.  HENT:     Head: Normocephalic and atraumatic.     Right Ear: Hearing, tympanic membrane, ear canal and external ear normal.     Left Ear: Hearing, tympanic membrane, ear canal and external ear normal.     Nose: Mucosal edema (congested nasal mucosa) and rhinorrhea present.     Right Sinus: No maxillary sinus tenderness or frontal sinus tenderness.     Left Sinus: No maxillary sinus tenderness or frontal sinus tenderness.     Mouth/Throat:     Pharynx: Uvula midline. No oropharyngeal exudate or posterior oropharyngeal erythema.     Tonsils: No tonsillar abscesses.  Eyes:     General: No  scleral icterus.    Conjunctiva/sclera: Conjunctivae normal.     Pupils: Pupils are equal, round, and reactive to light.  Neck:     Musculoskeletal: Normal range of motion and neck supple.  Cardiovascular:     Rate and Rhythm: Normal rate and regular rhythm.     Heart sounds: Normal heart sounds. No murmur.  Pulmonary:     Effort: Pulmonary effort is normal. No respiratory distress.     Breath sounds: Normal breath sounds. No wheezing or rales.     Comments: Lungs clear, deep cough present Lymphadenopathy:     Cervical: No cervical adenopathy.  Skin:    General: Skin is warm and dry.     Findings: No rash.        Assessment & Plan:   Problem List Items Addressed This Visit    Acute respiratory infection - Primary    Anticipate viral given short  duration and no localizing symptoms. Supportive care reviewed. Will Rx ttessalon perls as she states they have previously been effective. WASP for zpack provided with indications when to fill - ongoing symptoms past 2 wks, or progressively worsening cough. Pt agrees with plan. Further supportive care as per instructions (short NSAID course)      Relevant Medications   azithromycin (ZITHROMAX) 250 MG tablet       Meds ordered this encounter  Medications  . DISCONTD: benzonatate (TESSALON) 100 MG capsule    Sig: Take 1 capsule (100 mg total) by mouth 3 (three) times daily as needed for cough.    Dispense:  30 capsule    Refill:  0  . azithromycin (ZITHROMAX) 250 MG tablet    Sig: Take two tablets on day one followed by one tablet on days 2-5    Dispense:  6 each    Refill:  0  . benzonatate (TESSALON) 100 MG capsule    Sig: Take 1 capsule (100 mg total) by mouth 3 (three) times daily as needed for cough.    Dispense:  30 capsule    Refill:  0   No orders of the defined types were placed in this encounter.  Patient Instructions  I think you have bronchitis but likely viral - should improve over time. Treat cough with tessalon perls, honey with lemon, herbal teas.  May try ibuprofen 400mg  with meals for next few days.  Push fluids and rest. If fever >101, worsening productive cough, or not improving with treatment above, may fill antibiotic zpack prescription printed out today.    Follow up plan: Return if symptoms worsen or fail to improve.  Ria Bush, MD

## 2018-10-14 NOTE — Assessment & Plan Note (Addendum)
Anticipate viral given short duration and no localizing symptoms. Supportive care reviewed. Will Rx ttessalon perls as she states they have previously been effective. WASP for zpack provided with indications when to fill - ongoing symptoms past 2 wks, or progressively worsening cough. Pt agrees with plan. Further supportive care as per instructions (short NSAID course)

## 2018-10-24 ENCOUNTER — Telehealth: Payer: Self-pay | Admitting: *Deleted

## 2018-10-24 ENCOUNTER — Other Ambulatory Visit: Payer: Self-pay | Admitting: Family Medicine

## 2018-10-24 MED ORDER — SERTRALINE HCL 50 MG PO TABS: 50.0000 mg | ORAL_TABLET | Freq: Every day | ORAL | 3 refills | Status: DC

## 2018-10-24 NOTE — Telephone Encounter (Signed)
Pt left vm at triage and states she was recently seen and discussed her depression with Dr Diona Browner. She was advised if she is needing to restart depression med, to contact office back for a Rx. Prescription can be sent to [facility] on file. pls advise

## 2018-10-24 NOTE — Telephone Encounter (Signed)
Call   I restarted her sertraline at 50 mg. If this is not improving her symptoms in 4 weeks we can increase back to her previous dose 100 mg dialy.

## 2018-10-24 NOTE — Telephone Encounter (Signed)
See below note.

## 2018-10-25 NOTE — Telephone Encounter (Signed)
Beth Fox notified as instructed by telephone.  Patient states understanding.

## 2018-11-18 ENCOUNTER — Other Ambulatory Visit: Payer: Self-pay | Admitting: Family Medicine

## 2018-12-14 DIAGNOSIS — H2513 Age-related nuclear cataract, bilateral: Secondary | ICD-10-CM | POA: Diagnosis not present

## 2019-02-27 ENCOUNTER — Other Ambulatory Visit: Payer: Self-pay | Admitting: Family Medicine

## 2019-03-17 ENCOUNTER — Other Ambulatory Visit: Payer: Self-pay | Admitting: Family Medicine

## 2019-03-17 NOTE — Telephone Encounter (Signed)
Last office visit 10/14/2018 with Dr. Darnell Level for cough.  Last refilled 09/06/2018 for #270 with one refill. CPE scheduled for 10/03/2019.

## 2019-05-23 ENCOUNTER — Telehealth: Payer: Self-pay

## 2019-05-23 ENCOUNTER — Other Ambulatory Visit: Payer: Self-pay

## 2019-05-23 ENCOUNTER — Other Ambulatory Visit: Payer: Self-pay | Admitting: Family Medicine

## 2019-05-23 ENCOUNTER — Encounter: Payer: Self-pay | Admitting: Gastroenterology

## 2019-05-23 ENCOUNTER — Ambulatory Visit (INDEPENDENT_AMBULATORY_CARE_PROVIDER_SITE_OTHER): Payer: Medicare Other | Admitting: Gastroenterology

## 2019-05-23 VITALS — BP 127/77 | HR 76 | Temp 98.2°F | Ht 63.75 in | Wt 147.8 lb

## 2019-05-23 DIAGNOSIS — K219 Gastro-esophageal reflux disease without esophagitis: Secondary | ICD-10-CM

## 2019-05-23 DIAGNOSIS — R1032 Left lower quadrant pain: Secondary | ICD-10-CM

## 2019-05-23 MED ORDER — OMEPRAZOLE 20 MG PO CPDR: 20.0000 mg | DELAYED_RELEASE_CAPSULE | Freq: Two times a day (BID) | ORAL | 1 refills | Status: DC

## 2019-05-23 NOTE — Progress Notes (Signed)
Beth Antigua, MD [address]  [place]  [place], [place] [address]  Main: [phone]  Fax: [phone]   Primary Care Physician: Jinny Sanders, MD   Chief Complaint  Patient presents with  . Follow-up    constipation    HPI: Beth Fox is a 68 y.o. female is now reporting episodic symptoms of left lower quadrant abdominal pain, associated with nausea and vomiting, and also sensation of regurgitation prior to the nausea and vomiting..  No hematemesis.  The pain is dull, 7/10, nonradiating, lasts for hours and self resolves.  It only resolves after she vomits.  Reports one soft bowel movement daily without blood.  No weight loss.  Symptoms occur about once a week.  Current Outpatient Medications  Medication Sig Dispense Refill  . azithromycin (ZITHROMAX) 250 MG tablet Take two tablets on day one followed by one tablet on days 2-5 6 each 0  . benzonatate (TESSALON) 100 MG capsule Take 1 capsule (100 mg total) by mouth 3 (three) times daily as needed for cough. 30 capsule 0  . Cholecalciferol (VITAMIN D3) 1.25 MG (50000 UT) CAPS Take 1 capsule by mouth once a week. 12 capsule 3  . gabapentin (NEURONTIN) 100 MG capsule TAKE 1 CAPSULE BY MOUTH 3 TIMES DAILY. START AT 100 MG AT BEDTIME & GRADUALLY INCREASE TO 3XDAY 270 capsule 1  . hydrochlorothiazide (HYDRODIURIL) 25 MG tablet TAKE 1 TABLET BY MOUTH EVERY DAY 90 tablet 1  . omeprazole (PRILOSEC) 20 MG capsule Take 1 capsule (20 mg total) by mouth 2 (two) times daily before a meal. 60 capsule 1  . Probiotic Product (PROBIOTIC-10 PO) Take 1 tablet by mouth daily.    . sertraline (ZOLOFT) 50 MG tablet TAKE 1 TABLET BY MOUTH EVERY DAY 90 tablet 1   No current facility-administered medications for this visit.     Allergies as of 05/23/2019  . (No Known Allergies)    ROS:  General: Negative for anorexia, weight loss, fever, chills, fatigue, weakness. ENT: Negative for hoarseness, difficulty swallowing , nasal  congestion. CV: Negative for chest pain, angina, palpitations, dyspnea on exertion, peripheral edema.  Respiratory: Negative for dyspnea at rest, dyspnea on exertion, cough, sputum, wheezing.  GI: See history of present illness. GU:  Negative for dysuria, hematuria, urinary incontinence, urinary frequency, nocturnal urination.  Endo: Negative for unusual weight change.    Physical Examination:   There were no vitals taken for this visit.  General: Well-nourished, well-developed in no acute distress.  Eyes: No icterus. Conjunctivae pink. Mouth: Oropharyngeal mucosa moist and pink , no lesions erythema or exudate. Neck: Supple, Trachea midline Abdomen: Bowel sounds are normal, nontender, nondistended, no hepatosplenomegaly or masses, no abdominal bruits or hernia , no rebound or guarding.   Extremities: No lower extremity edema. No clubbing or deformities. Neuro: Alert and oriented x 3.  Grossly intact. Skin: Warm and dry, no jaundice.   Psych: Alert and cooperative, normal mood and affect.   Labs: CMP     Component Value Date/Time   NA 142 09/15/2018 0838   K 3.2 (L) 09/15/2018 0838   CL 102 09/15/2018 0838   CO2 32 09/15/2018 0838   GLUCOSE 103 (H) 09/15/2018 0838   BUN 22 09/15/2018 0838   CREATININE 1.11 09/15/2018 0838   CALCIUM 9.8 09/15/2018 0838   PROT 7.2 09/15/2018 0838   ALBUMIN 4.5 09/15/2018 0838   AST 24 09/15/2018 0838   ALT 29 09/15/2018 0838   ALKPHOS 72 09/15/2018 1638  BILITOT 0.4 09/15/2018 0838   GFRNONAA 77.07 08/27/2010 0933   Lab Results  Component Value Date   WBC 5.4 09/15/2018   HGB 14.0 09/15/2018   HCT 41.2 09/15/2018   MCV 91.9 09/15/2018   PLT 194.0 09/15/2018    Imaging Studies: No results found.  Assessment and Plan:   Beth Fox is a 68 y.o. y/o female with episodic left lower quadrant abdominal pain, associated with nausea and vomiting and heartburn  Patient is not losing any weight and denies any further constipation   We will obtain a CT scan for further evaluation.  However, we will try to obtain the scan when she is having her abdominal pain.  We will plan on getting the CT scan approved and have asked her to call us when the pain starts, so we can call the radiology department and try to get it done stat on the day that she is having the pain.  She agrees with the plan.  In the meantime, I will also increase her omeprazole as she reports regurgitation sensation before she has nausea and vomiting in case her upper symptoms are related to GERD.   (Risks of PPI use were discussed with patient including bone loss, C. Diff diarrhea, pneumonia, infections, CKD, electrolyte abnormalities.  Pt. Verbalizes understanding and chooses to continue the medication.)   Dr Beth Fox

## 2019-05-23 NOTE — Telephone Encounter (Signed)
Pt seen in office today with c/o abdominal pain lasting for 3 hours or more, approx. 1x week. Dr. Bonna Gains ordered a stat CT scan for pt to have done when this pain occurs during day time. She is to contact office Monday thru Thursday (office closed on Fridays) 8-5pm and ask for Seraphim Affinito or Ginger to let us know the pain is occurring and needs to have the CT scan. Ginger is in process getting this authorized so it can be done when needed. I had to leave pt a message.

## 2019-05-30 ENCOUNTER — Telehealth: Payer: Self-pay | Admitting: Gastroenterology

## 2019-05-30 NOTE — Telephone Encounter (Signed)
ERROR!! PATIENT TO CALL THE PHARMACY.

## 2019-06-12 ENCOUNTER — Ambulatory Visit
Admission: RE | Admit: 2019-06-12 | Discharge: 2019-06-12 | Disposition: A | Discharge status: Home / Self Care | Payer: Medicare Other | Source: Ambulatory Visit | Attending: Gastroenterology | Admitting: Gastroenterology

## 2019-06-12 ENCOUNTER — Telehealth: Payer: Self-pay

## 2019-06-12 ENCOUNTER — Other Ambulatory Visit: Payer: Self-pay

## 2019-06-12 DIAGNOSIS — R1032 Left lower quadrant pain: Secondary | ICD-10-CM | POA: Insufficient documentation

## 2019-06-12 DIAGNOSIS — K5730 Diverticulosis of large intestine without perforation or abscess without bleeding: Secondary | ICD-10-CM | POA: Diagnosis not present

## 2019-06-12 LAB — POCT I-STAT CREATININE: Creatinine, Ser: 1 mg/dL (ref 0.44–1.00)

## 2019-06-12 MED ORDER — IOHEXOL 300 MG/ML  SOLN
100.0000 mL | Freq: Once | INTRAMUSCULAR | Status: AC | PRN
  Administered 2019-06-12: 100 mL via INTRAVENOUS

## 2019-06-12 NOTE — Telephone Encounter (Signed)
Patient called stating she is having the same abdominal pain that she has been having off and on. Patient states she was told to call if the pain returned and we would do a STAT CT abdomen pelvis w contrast. Called over to the central scheduling and [facility] Called patient and patient states she will be there today at 4:30. Ginger states she does not need a PA because patient has medicare.

## 2019-06-13 ENCOUNTER — Other Ambulatory Visit (INDEPENDENT_AMBULATORY_CARE_PROVIDER_SITE_OTHER): Payer: Medicare Other

## 2019-06-13 ENCOUNTER — Telehealth: Payer: Self-pay | Admitting: *Deleted

## 2019-06-13 ENCOUNTER — Telehealth: Payer: Self-pay | Admitting: Gastroenterology

## 2019-06-13 ENCOUNTER — Telehealth: Payer: Self-pay

## 2019-06-13 DIAGNOSIS — N3289 Other specified disorders of bladder: Secondary | ICD-10-CM | POA: Diagnosis not present

## 2019-06-13 DIAGNOSIS — R8290 Unspecified abnormal findings in urine: Secondary | ICD-10-CM

## 2019-06-13 DIAGNOSIS — R1032 Left lower quadrant pain: Secondary | ICD-10-CM

## 2019-06-13 LAB — POC URINALSYSI DIPSTICK (AUTOMATED)
Blood, UA: NEGATIVE
Glucose, UA: NEGATIVE
Nitrite, UA: NEGATIVE
Protein, UA: POSITIVE — AB
Spec Grav, UA: 1.025 (ref 1.010–1.025)
Urobilinogen, UA: 0.2 E.U./dL
pH, UA: 5.5 (ref 5.0–8.0)

## 2019-06-13 NOTE — Telephone Encounter (Signed)
-----   Message from Virgel Manifold, MD sent at 06/13/2019 10:47 AM EDT ----- Caryl Pina please let the patient know, her CT showed thickening in the bladder, suspicious for cystitis or UTI.  She should call her primary care provider, today to obtain further testing for this.  She has a large hiatal hernia, and I would recommend referral to a surgeon to get this evaluated as this can cause some of her nausea vomiting symptoms as well.  If she is agreeable, please refer her to Dr. Hampton Abbot.  The CT also reports fatty liver, which is fat deposition in the liver that is benign.  However, it can be reversed with diet, weight loss and exercise.  We can discuss this further at her next follow-up visit.  Please make follow-up with me as soon as she would like, or within the next 4 to 6 weeks

## 2019-06-13 NOTE — Telephone Encounter (Signed)
Pt left vm to get her CT scan results

## 2019-06-13 NOTE — Telephone Encounter (Signed)
Called patient and gave patient the results of the test and documented in another telephone call

## 2019-06-13 NOTE — Telephone Encounter (Signed)
Patient called stating that she had a CT scan done yesterday and she was given the results today. Patient stated that she was told there was thickening in her bladder which could be a UTI. Patient stated that she is not having any urinary tract symptoms. Patient stated the GI doctor ordered the scan because she has been having pain off and on for several years. Patient stated that she was told to contact her PCP and see what her recommendations are.

## 2019-06-13 NOTE — Telephone Encounter (Signed)
Spoke with Ms. Harju.  She will drop off urine sample.  Virtual visit scheduled for 06/15/2019 at 11:20 am with Dr. Diona Browner.

## 2019-06-13 NOTE — Telephone Encounter (Signed)
Have her set up a drop of UA, micro and to do a virtual visit following... or can make appt to be ssen in office.

## 2019-06-13 NOTE — Telephone Encounter (Signed)
Patient verbalized understanding of lab results. Patient states she will call her PCP office to scheduled a appointment. Patient would like a referral for Hiatal hernia. Patient made follow up appointment. Put in referral and signed order

## 2019-06-15 ENCOUNTER — Encounter: Payer: Self-pay | Admitting: Family Medicine

## 2019-06-15 ENCOUNTER — Ambulatory Visit (INDEPENDENT_AMBULATORY_CARE_PROVIDER_SITE_OTHER): Payer: Medicare Other | Admitting: Family Medicine

## 2019-06-15 DIAGNOSIS — N3289 Other specified disorders of bladder: Secondary | ICD-10-CM

## 2019-06-15 DIAGNOSIS — K760 Fatty (change of) liver, not elsewhere classified: Secondary | ICD-10-CM | POA: Diagnosis not present

## 2019-06-15 LAB — URINE CULTURE
MICRO NUMBER:: 834686
Result:: NO GROWTH
SPECIMEN QUALITY:: ADEQUATE

## 2019-06-15 NOTE — Progress Notes (Signed)
VIRTUAL VISIT Due to national recommendations of social distancing due to [place] 19, a virtual visit is felt to be most appropriate for this patient at this time.   I connected with the patient on 06/15/19 at 11:20 AM EDT by virtual telehealth platform and verified that I am speaking with the correct person using two identifiers.   Interactive audio and video telecommunications were attempted between this provider and patient, however failed, due to patient having technical difficulties OR patient did not have access to video capability.  We continued and completed visit with audio only.   I discussed the limitations, risks, security and privacy concerns of performing an evaluation and management service by  virtual telehealth platform and the availability of in person appointments. I also discussed with the patient that there may be a patient responsible charge related to this service. The patient expressed understanding and agreed to proceed.  Patient location: Home Provider Location: [facility] Participants: Eliezer Lofts and Berneta Levins   Chief Complaint  Patient presents with  . Bladder thickening on CT    History of Present Illness: 68 year old female presents for follow up regarding bladder wall thickening noted incidentally on CT abd pelvis  Performed to eval acute LLQ pain by GI Dr. Bonna Gains  CT showed:large hiatal hernia ( referred to surgeon) as well as suggestion of mild wall thickening and perivesicular edema about the bladder dome and left aspect of the urinary bladder, suspicious for cystitis/urinary tract infection. Recommend correlation with Urinalysis. Also reports fatty liver, which is fat deposition in the liver that is benign   no urinary frequency, no urgency, no hematuria, no dysuria.  UA showed trace leuks, positive protein and NO blood  Urine culture returned negative.. no growth.   She does not drink a lot water.   no family history of bladder   Cancer. COVID 19 screen No recent travel or known exposure to COVID19 The patient denies respiratory symptoms of COVID 19 at this time.  The importance of social distancing was discussed today.   Review of Systems  Constitutional: Negative for chills and fever.  HENT: Negative for congestion and ear pain.   Eyes: Negative for pain and redness.  Respiratory: Negative for cough and shortness of breath.   Cardiovascular: Negative for chest pain, palpitations and leg swelling.  Gastrointestinal: Negative for abdominal pain, blood in stool, constipation, diarrhea, nausea and vomiting.  Genitourinary: Negative for dysuria.  Musculoskeletal: Negative for falls and myalgias.  Skin: Negative for rash.  Neurological: Negative for dizziness.  Psychiatric/Behavioral: Negative for depression. The patient is not nervous/anxious.       Past Medical History:  Diagnosis Date  . 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 Personal history of chemotherapy     reports that she has never smoked. She has never used smokeless tobacco. She reports that she does not drink alcohol or use drugs.   Current Outpatient Medications:  .  Cholecalciferol (VITAMIN D3) 1.25 MG (50000 UT) CAPS, Take 1 capsule by mouth once a week., Disp: 12 capsule, Rfl: 3 .  gabapentin (NEURONTIN) 100 MG capsule, TAKE 1 CAPSULE BY MOUTH 3 TIMES DAILY. START AT 100 MG AT BEDTIME & GRADUALLY INCREASE TO 3XDAY, Disp: 270 capsule, Rfl: 1 .  hydrochlorothiazide (HYDRODIURIL) 25 MG tablet, TAKE 1 TABLET BY MOUTH EVERY DAY, Disp: 90 tablet, Rfl: 1 .  omeprazole (PRILOSEC) 20 MG capsule, Take 1 capsule (20 mg total) by mouth 2 (two) times  daily before a meal., Disp: 60 capsule, Rfl: 1 .  Probiotic Product (PROBIOTIC-10 PO), Take 1 tablet by mouth daily., Disp: , Rfl:  .  sertraline (ZOLOFT) 50 MG tablet, TAKE 1 TABLET BY MOUTH EVERY DAY, Disp: 90 tablet, Rfl: 1   Observations/Objective: Height 5' 3.75" (1.619 m).  Physical Exam   Physical Exam Constitutional:      General: The patient is not in acute distress. Pulmonary:     Effort: Pulmonary effort is normal. No respiratory distress.  Neurological:     Mental Status: The patient is alert and oriented to person, place, and time.  Psychiatric:        Mood and Affect: Mood normal.        Behavior: Behavior normal.   Assessment and Plan   Bladder wall thickening No UTI, no urinary symptoms, no hematuria. No family history of  Bladder cancer. She is nonsmoker and no bladder cancer risk factors known.  No further eval needed. Total visit time 10 minutes, > 50% spent counseling and cordinating patients care.   I discussed the assessment and treatment plan with the patient. The patient was provided an opportunity to ask questions and all were answered. The patient agreed with the plan and demonstrated an understanding of the instructions.   The patient was advised to call back or seek an in-person evaluation if the symptoms worsen or if the condition fails to improve as anticipated.     Eliezer Lofts, MD

## 2019-06-16 DIAGNOSIS — N3289 Other specified disorders of bladder: Secondary | ICD-10-CM | POA: Insufficient documentation

## 2019-06-16 NOTE — Assessment & Plan Note (Signed)
No UTI, no urinary symptoms, no hematuria. No family history of  Bladder cancer. She is nonsmoker and no bladder cancer risk factors known.  No further eval needed.

## 2019-07-03 ENCOUNTER — Ambulatory Visit: Payer: Medicare Other | Admitting: Surgery

## 2019-07-17 ENCOUNTER — Telehealth: Payer: Self-pay

## 2019-07-17 MED ORDER — OMEPRAZOLE 20 MG PO CPDR: 20.0000 mg | DELAYED_RELEASE_CAPSULE | Freq: Two times a day (BID) | ORAL | 0 refills | Status: DC

## 2019-07-17 NOTE — Telephone Encounter (Signed)
Last office visit 05/23/2019 GERD  Last refill 05/23/2019  Has appointment 08/03/19

## 2019-07-20 DIAGNOSIS — R1032 Left lower quadrant pain: Secondary | ICD-10-CM | POA: Diagnosis not present

## 2019-07-26 ENCOUNTER — Other Ambulatory Visit: Payer: Self-pay

## 2019-07-26 ENCOUNTER — Encounter: Payer: Self-pay | Admitting: Obstetrics and Gynecology

## 2019-07-26 ENCOUNTER — Ambulatory Visit (INDEPENDENT_AMBULATORY_CARE_PROVIDER_SITE_OTHER): Payer: Medicare Other | Admitting: Obstetrics and Gynecology

## 2019-07-26 VITALS — BP 132/78 | Ht 65.0 in | Wt 151.0 lb

## 2019-07-26 DIAGNOSIS — N83201 Unspecified ovarian cyst, right side: Secondary | ICD-10-CM

## 2019-07-26 NOTE — Progress Notes (Signed)
Obstetrics & Gynecology Office Visit   Chief Complaint:  Chief Complaint  Patient presents with  . Ovarian Cyst    right    History of Present Illness: The patient is a 68 y.o. female presenting for consultation at the request of  Dr. Hervey Ard concerning a recently imaged right adnexal cysts.  Initial presentation was prompted by abdominal pain.  Previous CT imaging demonstrated dimensions of 3.3cm with an adjacent 2.2cm cyst  Appearance was notable simple cyst, no omental caking and absence of ascites. The patient endorses associated symptoms of abdominal pain, early satiety and nausea.  The patient denies associated symptoms of  night sweats, vaginal bleeding and pelvic pressure.  There is not a notable family history of ovarian cancer, uterine cancer, breast cancer, or colon cancer.  Pain in left lower quadrant, was treated for UTI following CT findings of acute cystitis.  Review of Systems: 10 point review of systems negative unless otherwise noted in HPI  Past Medical History:  Past Medical History:  Diagnosis Date  . Cancer ([place]) 1997 rt side   RIGHT mastectomy   . Personal history of chemotherapy     Past Surgical History:  Past Surgical History:  Procedure Laterality Date  . APPENDECTOMY    . BREAST SURGERY     mastectomy on right   . FOOT SURGERY Left 06/2018   Dr. Milinda Pointer  . MASTECTOMY Right Oct 1997   c Reconstruction surgery on Right Breast  . REDUCTION MAMMAPLASTY Left 2012  . TUBAL LIGATION      Gynecologic History: No LMP recorded. Patient is postmenopausal.  Obstetric History: G1P0010  Family History:  Family History  Problem Relation Age of Onset  . Diabetes Mother   . Hypertension Mother   . Stroke Mother   . Cancer Father        melonoma  . Diabetes Father   . Hypertension Father   . Parkinsonism Father   . Breast cancer Maternal Aunt   . Breast cancer Paternal Aunt   . Breast cancer Cousin   . Hypertension Sister   . Arthritis  Sister   . Stroke Maternal Grandmother   . Stroke Paternal Grandmother   . Heart attack Paternal Grandfather        before age 71  . Hypertension Sister   . Arthritis Sister   . Breast cancer Cousin     Social History:  Social History   Socioeconomic History  . Marital status: Married    Spouse name: Not on file  . Number of children: 0  . Years of education: Not on file  . Highest education level: Not on file  Occupational History  . Occupation: Scientist, clinical (histocompatibility and immunogenetics): [employer]  Social Needs  . Financial resource strain: Not on file  . Food insecurity    Worry: Not on file    Inability: Not on file  . Transportation needs    Medical: Not on file    Non-medical: Not on file  Tobacco Use  . Smoking status: Never Smoker  . Smokeless tobacco: Never Used  Substance and Sexual Activity  . Alcohol use: No    Alcohol/week: 0.0 standard drinks  . Drug use: No  . Sexual activity: Not Currently    Birth control/protection: Post-menopausal  Lifestyle  . Physical activity    Days per week: Not on file    Minutes per session: Not on file  . Stress: Not on file  Relationships  . Social connections  Talks on phone: Not on file    Gets together: Not on file    Attends religious service: Not on file    Active member of club or organization: Not on file    Attends meetings of clubs or organizations: Not on file    Relationship status: Not on file  . Intimate partner violence    Fear of current or ex partner: Not on file    Emotionally abused: Not on file    Physically abused: Not on file    Forced sexual activity: Not on file  Other Topics Concern  . Not on file  Social History Narrative   Regular exercise--no   Diet: fast food and sweets    Allergies:  No Known Allergies  Medications: Prior to Admission medications   Medication Sig Start Date End Date Taking? Authorizing Provider  Cholecalciferol (VITAMIN D3) 1.25 MG (50000 UT) CAPS Take 1 capsule by mouth  once a week. 09/20/18  Yes Bedsole, Amy E, MD  gabapentin (NEURONTIN) 100 MG capsule TAKE 1 CAPSULE BY MOUTH 3 TIMES DAILY. START AT 100 MG AT BEDTIME & GRADUALLY INCREASE TO 3XDAY 03/17/19  Yes Bedsole, Amy E, MD  hydrochlorothiazide (HYDRODIURIL) 25 MG tablet TAKE 1 TABLET BY MOUTH EVERY DAY 05/23/19  Yes Bedsole, Amy E, MD  omeprazole (PRILOSEC) 20 MG capsule Take 1 capsule (20 mg total) by mouth 2 (two) times daily before a meal. 07/17/19 08/16/19 Yes Tahiliani, Lennette Bihari, MD  Probiotic Product (PROBIOTIC-10 PO) Take 1 tablet by mouth daily.   Yes [provider]  sertraline (ZOLOFT) 50 MG tablet TAKE 1 TABLET BY MOUTH EVERY DAY 02/27/19  Yes Jinny Sanders, MD    Physical Exam Vitals:  Vitals:   07/26/19 1522  BP: 132/78   No LMP recorded. Patient is postmenopausal.  General: NAD, well nourished, appears stated age 80: normocephalic, anicteric Pulmonary: No increased work of breathing Neurologic: Grossly intact Psychiatric: mood appropriate, affect full  Female chaperone present for pelvic and breast  portions of the physical exam  No results found.  Assessment: 68 y.o. G1P0010 presenting for incidental finding of right ovarian cyst  Plan: Problem List Items Addressed This Visit      Endocrine   Right ovarian cyst, need 2020 re-eval [place] - Primary   Relevant Orders   US Transvaginal Non-OB       1) POSTMENOPAUSAL The incidence and implication of adnexal masses and ovarian cysts were discussed with the patient in detail.  Prior imaging if available was reviewed at today's visit.  While adnexal masses and cysts are a less common imaging finding in postmenopausal women as compared to premenopausal women, the vast majority of these lesions are still benign.  Follow up imaging to determine stability in size and appearance is reasonable in order to provide additional reassurance.  In some cases symptoms, family history, or indeterminate or concerning findings may warrant  surgical evaluation and referral to a Gynecology-Oncologist., or serum tumor markers.    2) Tumor makers were not ordered  3) Hiatal hernia - patient with hiatal hernia.  Given location of pain, as well as association with po intake I feel this is the most likely source of the patient's abdominal pain.  4) Return in about 1 week (around 08/02/2019) for TVUS and follow up.   Malachy Mood, MD, [facility], [place]

## 2019-08-01 ENCOUNTER — Ambulatory Visit: Payer: Medicare Other

## 2019-08-02 ENCOUNTER — Other Ambulatory Visit: Payer: Self-pay

## 2019-08-02 ENCOUNTER — Encounter: Payer: Self-pay | Admitting: Obstetrics and Gynecology

## 2019-08-02 ENCOUNTER — Ambulatory Visit (INDEPENDENT_AMBULATORY_CARE_PROVIDER_SITE_OTHER): Payer: Medicare Other | Admitting: Obstetrics and Gynecology

## 2019-08-02 ENCOUNTER — Telehealth: Payer: Self-pay | Admitting: Obstetrics and Gynecology

## 2019-08-02 ENCOUNTER — Ambulatory Visit (INDEPENDENT_AMBULATORY_CARE_PROVIDER_SITE_OTHER): Payer: Medicare Other

## 2019-08-02 VITALS — BP 130/75 | HR 85 | Wt 152.0 lb

## 2019-08-02 DIAGNOSIS — R1310 Dysphagia, unspecified: Secondary | ICD-10-CM

## 2019-08-02 DIAGNOSIS — K449 Diaphragmatic hernia without obstruction or gangrene: Secondary | ICD-10-CM | POA: Diagnosis not present

## 2019-08-02 DIAGNOSIS — N83201 Unspecified ovarian cyst, right side: Secondary | ICD-10-CM

## 2019-08-02 NOTE — Telephone Encounter (Signed)
Pt called back stating she would like surgery in [facility]

## 2019-08-02 NOTE — Progress Notes (Signed)
Gynecology Ultrasound Follow Up  Chief Complaint:  Chief Complaint  Patient presents with   Follow-up    GYN ultrasound     History of Present Illness: Patient is a 68 y.o. female who presents today for ultrasound evaluation of right ovarian cyst.  Ultrasound demonstrates the following findgins Adnexa: The right adnexa demonstrated two simple cysts, these are stable in size with prior CT imaging in August.  Appearance is suspicious for hydrosalpinx  Uterus: Non-enlarged with normally imaged endometrial stripe  Additional: no free fluid  Review of Systems: Review of Systems  Constitutional: Negative.   Gastrointestinal: Positive for abdominal pain and heartburn.       Dysphagia  Genitourinary: Negative.     Past Medical History:  Past Medical History:  Diagnosis Date   Cancer ([place]) 1997 rt side   RIGHT mastectomy    Personal history of chemotherapy     Past Surgical History:  Past Surgical History:  Procedure Laterality Date   APPENDECTOMY     BREAST SURGERY     mastectomy on right    FOOT SURGERY Left 06/2018   Dr. Milinda Pointer   MASTECTOMY Right Oct 1997   c Reconstruction surgery on Right Breast   REDUCTION MAMMAPLASTY Left 2012   TUBAL LIGATION      Gynecologic History:  No LMP recorded. Patient is postmenopausal.  Family History:  Family History  Problem Relation Age of Onset   Diabetes Mother    Hypertension Mother    Stroke Mother    Cancer Father        melonoma   Diabetes Father    Hypertension Father    Parkinsonism Father    Breast cancer Maternal Aunt    Breast cancer Paternal Aunt    Breast cancer Cousin    Hypertension Sister    Arthritis Sister    Stroke Maternal Grandmother    Stroke Paternal Grandmother    Heart attack Paternal Grandfather        before age 81   Hypertension Sister    Arthritis Sister    Breast cancer Cousin     Social History:  Social History   Socioeconomic History   Marital  status: Married    Spouse name: Not on file   Number of children: 0   Years of education: Not on file   Highest education level: Not on file  Occupational History   Occupation: Scientist, clinical (histocompatibility and immunogenetics): [employer]  Social Needs   Emergency planning/management officer strain: Not on file   Food insecurity    Worry: Not on file    Inability: Not on file   Transportation needs    Medical: Not on file    Non-medical: Not on file  Tobacco Use   Smoking status: Never Smoker   Smokeless tobacco: Never Used  Substance and Sexual Activity   Alcohol use: No    Alcohol/week: 0.0 standard drinks   Drug use: No   Sexual activity: Not Currently    Birth control/protection: Post-menopausal  Lifestyle   Physical activity    Days per week: Not on file    Minutes per session: Not on file   Stress: Not on file  Relationships   Social connections    Talks on phone: Not on file    Gets together: Not on file    Attends religious service: Not on file    Active member of club or organization: Not on file    Attends meetings of clubs or  organizations: Not on file    Relationship status: Not on file   Intimate partner violence    Fear of current or ex partner: Not on file    Emotionally abused: Not on file    Physically abused: Not on file    Forced sexual activity: Not on file  Other Topics Concern   Not on file  Social History Narrative   Regular exercise--no   Diet: fast food and sweets    Allergies:  No Known Allergies  Medications: Prior to Admission medications   Medication Sig Start Date End Date Taking? Authorizing Provider  Cholecalciferol (VITAMIN D3) 1.25 MG (50000 UT) CAPS Take 1 capsule by mouth once a week. 09/20/18   Bedsole, Amy E, MD  gabapentin (NEURONTIN) 100 MG capsule TAKE 1 CAPSULE BY MOUTH 3 TIMES DAILY. START AT 100 MG AT BEDTIME & GRADUALLY INCREASE TO 3XDAY 03/17/19   Bedsole, Amy E, MD  hydrochlorothiazide (HYDRODIURIL) 25 MG tablet TAKE 1 TABLET BY MOUTH EVERY  DAY 05/23/19   Bedsole, Amy E, MD  omeprazole (PRILOSEC) 20 MG capsule Take 1 capsule (20 mg total) by mouth 2 (two) times daily before a meal. 07/17/19 08/16/19  Virgel Manifold, MD  Probiotic Product (PROBIOTIC-10 PO) Take 1 tablet by mouth daily.    [provider]  sertraline (ZOLOFT) 50 MG tablet TAKE 1 TABLET BY MOUTH EVERY DAY 02/27/19   Bedsole, Amy E, MD    Physical Exam Vitals: Blood pressure 130/75, pulse 85, weight 152 lb (68.9 kg).  General: NAD HEENT: normocephalic, anicteric Pulmonary: No increased work of breathing Extremities: no edema, erythema, or tenderness Neurologic: Grossly intact, normal gait Psychiatric: mood appropriate, affect full  US Transvaginal Non-ob  Result Date: 08/02/2019 Patient Name: Beth Fox DOB: 10-15-1950 MRN: [id number] ULTRASOUND REPORT Location: [facility] Date of Service: 08/02/2019 Indications:Right ovarian cysts Findings: The uterus is axial and measures 5.4 x 3.2 x 2.6 cm. Echo texture is heterogenous without evidence of focal masses. The Endometrium measures 3.9 mm. Right Ovary measures 5.2 x 3.6 x 3.0 cm. It is not normal in appearance. There are two simple cysts in the right ovary measuring 21 x 18 x 22 mm and 34 x 30 x 32 mm. Left Ovary measures 2.0 x 1.1 x 1.0 cm. It is normal in appearance. Survey of the adnexa demonstrates no adnexal masses. There is no free fluid in the cul de sac. Impression: 1. Normal uterus and cervix. 2. There are two simple cysts in the right ovary. 3. Normal left ovary. Recommendations: 1.Clinical correlation with the patient's History and Physical Exam Gweneth Dimitri, RT Images reviewed and compared to CT abdomen and pelvis dated 06/12/2019.  There are two adjacent simple cysts, stable in size (2.2 and 3.3cm on CT).  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   Otherwise normal GYN study.  Malachy Mood, MD, [facility], [facility]  08/02/2019, 2:46 PM   Assessment: 68 y.o. G1P0010 No problem-specific Assessment & Plan notes found for this encounter.   Plan: Problem List Items Addressed This Visit      Endocrine   Right ovarian cyst, need 2020 re-eval [place] - Primary   Relevant Orders   Ovarian Malignancy Risk-ROMA   US Transvaginal Non-OB    Other Visit Diagnoses    Hiatal hernia       Relevant Orders   Amb Referral to Bariatric Surgery   Dysphagia, unspecified type  Relevant Orders   Amb Referral to Bariatric Surgery      1) Right ovarian cyst - ipsilateral to site of patient's pain, also does not account for dysphagia and pain after po intake.  The appearance seems most consistent with a small hydrosalpinx.  We discussed expectant management in which case I would recommend ROMA today with 6 month follow up imaging, or proceeding with laparoscopy for removal.  Discussed that based on appearance on imaging low risk of malignancy.  However, pathology diagnosis is the only means of being completely certain.   - Patient opts for expectant management at this time - LR2 4.7%, low risk of malignancy  2) Hiatal Hernia - spoke with Dr. Fleet Contras recommend Dr. Darnell Level for consideration of Nissen fundoplication  3) A total of 15 minutes were spent in face-to-face contact with the patient during this encounter with over half of that time devoted to counseling and coordination of care.  4) Return in about 6 months (around 01/31/2020) for TVUS and follow up.    Malachy Mood, MD, [facility], [facility] 08/02/2019, 2:50 PM

## 2019-08-02 NOTE — Telephone Encounter (Signed)
Spoke with patient will hold off until labs are back to decide

## 2019-08-03 ENCOUNTER — Ambulatory Visit (INDEPENDENT_AMBULATORY_CARE_PROVIDER_SITE_OTHER): Payer: Medicare Other

## 2019-08-03 ENCOUNTER — Ambulatory Visit (INDEPENDENT_AMBULATORY_CARE_PROVIDER_SITE_OTHER): Payer: Medicare Other | Admitting: Gastroenterology

## 2019-08-03 ENCOUNTER — Other Ambulatory Visit: Payer: Self-pay

## 2019-08-03 ENCOUNTER — Encounter: Payer: Self-pay | Admitting: Gastroenterology

## 2019-08-03 VITALS — BP 109/68 | HR 97 | Temp 98.2°F | Wt 151.1 lb

## 2019-08-03 DIAGNOSIS — K449 Diaphragmatic hernia without obstruction or gangrene: Secondary | ICD-10-CM | POA: Diagnosis not present

## 2019-08-03 DIAGNOSIS — Z23 Encounter for immunization: Secondary | ICD-10-CM | POA: Diagnosis not present

## 2019-08-03 LAB — OVARIAN MALIGNANCY RISK-ROMA
Cancer Antigen (CA) 125: 16.1 U/mL (ref 0.0–38.1)
HE4: 74.3 pmol/L (ref 0.0–96.5)
Postmenopausal ROMA: 1.71
Premenopausal ROMA: 1.72 — ABNORMAL HIGH

## 2019-08-03 LAB — POSTMENOPAUSAL INTERP: LOW

## 2019-08-03 LAB — PREMENOPAUSAL INTERP: HIGH

## 2019-08-03 NOTE — Progress Notes (Signed)
Beth Antigua, MD [address]  [place]  [place], [place] [address]  Main: [phone]  Fax: [phone]   Primary Care Physician: Jinny Sanders, MD   Chief Complaint  Patient presents with   Abdominal Pain    Patient had CT scan on 06/12/19 Patient states it has been a week since last abdominal pain     HPI: Beth Fox is a 68 y.o. female here for follow-up of nausea and vomiting.  These are continuing to occur in episodic questions, last episode was about a week ago.  No hematemesis.  Occur about once or twice a month.  No weight loss.  No abdominal pain.  Patient underwent a CT scan At the time of her abdominal pain on 06/12/2019.  This reported a large hiatal hernia.  Diverticulosis without diverticulitis.  Right ovarian cysts.  Hepatic steatosis. Mild wall thickening in the bladder.  Surgery evaluation was recommended due to large hiatal hernia.  She states she saw Dr. Tollie Pizza as she knows him from before as he has done surgeries on her before.  He does not do this type of surgery, but referred her to GYN for ovarian cysts.  She states, her GYN and Dr. Tollie Pizza are working on sending her to a specialist and to see if both her hiatal hernia repair and ovarian cyst removal if it is needed, can be done at one time.  She states they talked about referring her to [facility], or [facility] or colon.  However, she states GYN doctor has ordered blood work and had stated that based on this blood work they will decide if she needs ovarian surgery or not.  She was evaluated by her primary care provider in regard to the bladder thickening and did not report any urinary symptoms and no further evaluation was recommended by Dr. Diona Browner  Ovarian cysts are being evaluated by GYN Dr. Georgianne Fick  Patient has previously refused colonoscopy and would like to continue Cologuard testing by her primary care provider  Current Outpatient Medications  Medication Sig Dispense Refill    Cholecalciferol (VITAMIN D3) 1.25 MG (50000 UT) CAPS Take 1 capsule by mouth once a week. 12 capsule 3   gabapentin (NEURONTIN) 100 MG capsule TAKE 1 CAPSULE BY MOUTH 3 TIMES DAILY. START AT 100 MG AT BEDTIME & GRADUALLY INCREASE TO 3XDAY 270 capsule 1   hydrochlorothiazide (HYDRODIURIL) 25 MG tablet TAKE 1 TABLET BY MOUTH EVERY DAY 90 tablet 1   omeprazole (PRILOSEC) 20 MG capsule Take 1 capsule (20 mg total) by mouth 2 (two) times daily before a meal. 60 capsule 0   Probiotic Product (PROBIOTIC-10 PO) Take 1 tablet by mouth daily.     sertraline (ZOLOFT) 50 MG tablet TAKE 1 TABLET BY MOUTH EVERY DAY 90 tablet 1   No current facility-administered medications for this visit.     Allergies as of 08/03/2019   (No Known Allergies)    ROS:  General: Negative for anorexia, weight loss, fever, chills, fatigue, weakness. ENT: Negative for hoarseness, difficulty swallowing , nasal congestion. CV: Negative for chest pain, angina, palpitations, dyspnea on exertion, peripheral edema.  Respiratory: Negative for dyspnea at rest, dyspnea on exertion, cough, sputum, wheezing.  GI: See history of present illness. GU:  Negative for dysuria, hematuria, urinary incontinence, urinary frequency, nocturnal urination.  Endo: Negative for unusual weight change.    Physical Examination:   BP 109/68 (BP Location: Left Arm, Patient Position: Sitting, Cuff Size: Normal)    Pulse 97  Temp 98.2 F (36.8 C) (Oral)    Wt 151 lb 2 oz (68.5 kg)    BMI 25.15 kg/m   General: Well-nourished, well-developed in no acute distress.  Eyes: No icterus. Conjunctivae pink. Mouth: Oropharyngeal mucosa moist and pink , no lesions erythema or exudate. Neck: Supple, Trachea midline Abdomen: Bowel sounds are normal, nontender, nondistended, no hepatosplenomegaly or masses, no abdominal bruits or hernia , no rebound or guarding.   Extremities: No lower extremity edema. No clubbing or deformities. Neuro: Alert and oriented  x 3.  Grossly intact. Skin: Warm and dry, no jaundice.   Psych: Alert and cooperative, normal mood and affect.   Labs: CMP     Component Value Date/Time   NA 142 09/15/2018 0838   K 3.2 (L) 09/15/2018 0838   CL 102 09/15/2018 0838   CO2 32 09/15/2018 0838   GLUCOSE 103 (H) 09/15/2018 0838   BUN 22 09/15/2018 0838   CREATININE 1.00 06/12/2019 1913   CALCIUM 9.8 09/15/2018 0838   PROT 7.2 09/15/2018 0838   ALBUMIN 4.5 09/15/2018 0838   AST 24 09/15/2018 0838   ALT 29 09/15/2018 0838   ALKPHOS 72 09/15/2018 0838   BILITOT 0.4 09/15/2018 0838   GFRNONAA 77.07 08/27/2010 0933   Lab Results  Component Value Date   WBC 5.4 09/15/2018   HGB 14.0 09/15/2018   HCT 41.2 09/15/2018   MCV 91.9 09/15/2018   PLT 194.0 09/15/2018    Imaging Studies: US Transvaginal Non-ob  Result Date: 08/02/2019 Patient Name: Beth Fox DOB: 1951/05/28 MRN: [id number] ULTRASOUND REPORT Location: [facility] Date of Service: 08/02/2019 Indications:Right ovarian cysts Findings: The uterus is axial and measures 5.4 x 3.2 x 2.6 cm. Echo texture is heterogenous without evidence of focal masses. The Endometrium measures 3.9 mm. Right Ovary measures 5.2 x 3.6 x 3.0 cm. It is not normal in appearance. There are two simple cysts in the right ovary measuring 21 x 18 x 22 mm and 34 x 30 x 32 mm. Left Ovary measures 2.0 x 1.1 x 1.0 cm. It is normal in appearance. Survey of the adnexa demonstrates no adnexal masses. There is no free fluid in the cul de sac. Impression: 1. Normal uterus and cervix. 2. There are two simple cysts in the right ovary. 3. Normal left ovary. Recommendations: 1.Clinical correlation with the patient's History and Physical Exam Gweneth Dimitri, RT Images reviewed and compared to CT abdomen and pelvis dated 06/12/2019.  There are two adjacent simple cysts, stable in size (2.2 and 3.3cm on CT).  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   Otherwise normal GYN  study.  Malachy Mood, MD, [facility], [facility] 08/02/2019, 2:46 PM    Assessment and Plan:   Beth Fox is a 68 y.o. y/o female here for follow-up of nausea and vomiting, attributed to large hiatal hernia  Her episodic symptoms of nausea and vomiting, and completely asymptomatic outside of these episodes, and a large hiatal hernia seen on imaging explains the etiology of her symptoms  She is seeing Dr. Tollie Pizza next week and is also communicating with her GYN to obtain blood work results to decide if she will need ovarian surgery.  Based on both of the above discussions, she will be deciding where she will go for hiatal hernia surgery repair along with ovarian surgery if one is needed.  However, if she does not need ovarian surgery, she would like to proceed with  hernia repair and I have asked her to contact me if she is not set up with the surgeon for large hiatal hernia repair soon after she sees Dr. Tollie Pizza as he is on who has supposedly referred her to another surgeon for it.  I have asked her to contact me next week if she needs help with referral to a surgeon and we can refer her to [facility] surgeons who do hiatal hernia repairs  Televisit in 2 weeks to follow-up   Dr Beth Fox

## 2019-08-08 DIAGNOSIS — K449 Diaphragmatic hernia without obstruction or gangrene: Secondary | ICD-10-CM | POA: Diagnosis not present

## 2019-08-12 ENCOUNTER — Other Ambulatory Visit: Payer: Self-pay | Admitting: Gastroenterology

## 2019-08-14 NOTE — Telephone Encounter (Signed)
Last refill 07/17/19 0 refills

## 2019-08-16 DIAGNOSIS — K219 Gastro-esophageal reflux disease without esophagitis: Secondary | ICD-10-CM | POA: Diagnosis not present

## 2019-08-16 DIAGNOSIS — R1313 Dysphagia, pharyngeal phase: Secondary | ICD-10-CM | POA: Diagnosis not present

## 2019-08-16 DIAGNOSIS — R1013 Epigastric pain: Secondary | ICD-10-CM | POA: Diagnosis not present

## 2019-08-16 DIAGNOSIS — K449 Diaphragmatic hernia without obstruction or gangrene: Secondary | ICD-10-CM | POA: Diagnosis not present

## 2019-08-21 ENCOUNTER — Encounter: Payer: Self-pay | Admitting: Gastroenterology

## 2019-08-21 ENCOUNTER — Ambulatory Visit (INDEPENDENT_AMBULATORY_CARE_PROVIDER_SITE_OTHER): Payer: Medicare Other | Admitting: Gastroenterology

## 2019-08-21 DIAGNOSIS — R112 Nausea with vomiting, unspecified: Secondary | ICD-10-CM

## 2019-08-21 NOTE — Progress Notes (Signed)
[name], MD [address]  [place]  [place], [place] [address]  Main: [phone]  Fax: [phone]   Primary Care Physician: Jinny Sanders, MD  Virtual Visit via Telephone Note  I connected with patient on 08/21/19 at  1:00 PM EST by telephone and verified that I am speaking with the correct person using two identifiers.   I discussed the limitations, risks, security and privacy concerns of performing an evaluation and management service by telephone and the availability of in person appointments. I also discussed with the patient that there may be a patient responsible charge related to this service. The patient expressed understanding and agreed to proceed.  Location of Patient: Home Location of Provider: Home Persons involved: Patient and provider only during the visit (nursing staff and front desk staff was involved in communicating with the patient prior to the appointment, reviewing medications and checking them in)   History of Present Illness: Chief Complaint  Patient presents with  . Hernia    Patient is having no symptoms      HPI: Beth Fox is a 68 y.o. female here for follow up of N/V attributed to large hiatal hernia. Pt was able to establish care with a surgeon for hiatal hernia repair and is scheduled for surgery this week at [facility] in [place]. She does not have any other questions or complaints at this time. No hematemesis. N/V occurs episodically about twice a month which has not changes.   Previous history: Patient underwent a CT scan At the time of her abdominal pain on 06/12/2019.  This reported a large hiatal hernia.  Diverticulosis without diverticulitis.  Right ovarian cysts.  Hepatic steatosis. Mild wall thickening in the bladder.  Surgery evaluation was recommended due to large hiatal hernia.  She states she saw Dr. Tollie Pizza as she knows him from before as he has done surgeries on her before.  He does not do this type of  surgery, but referred her to GYN for ovarian cysts.  He referred pt to Dr. Darnell Level and she is having hiatal hernia surgery with him  Ovarian cysts are being evaluated by GYN Dr. Georgianne Fick  Patient has previously refused colonoscopy and would like to continue Cologuard testing by her primary care provider    Current Outpatient Medications  Medication Sig Dispense Refill  . Cholecalciferol (VITAMIN D3) 1.25 MG (50000 UT) CAPS Take 1 capsule by mouth once a week. 12 capsule 3  . gabapentin (NEURONTIN) 100 MG capsule TAKE 1 CAPSULE BY MOUTH 3 TIMES DAILY. START AT 100 MG AT BEDTIME & GRADUALLY INCREASE TO 3XDAY 270 capsule 1  . hydrochlorothiazide (HYDRODIURIL) 25 MG tablet TAKE 1 TABLET BY MOUTH EVERY DAY 90 tablet 1  . omeprazole (PRILOSEC) 20 MG capsule Take 1 capsule (20 mg total) by mouth 2 (two) times daily before a meal. 60 capsule 0  . Probiotic Product (PROBIOTIC-10 PO) Take 1 tablet by mouth daily.    . sertraline (ZOLOFT) 50 MG tablet TAKE 1 TABLET BY MOUTH EVERY DAY 90 tablet 1   No current facility-administered medications for this visit.     Allergies as of 08/21/2019  . (No Known Allergies)    Review of Systems:    All systems reviewed and negative except where noted in HPI.   Observations/Objective:  Labs: CMP     Component Value Date/Time   NA 142 09/15/2018 0838   K 3.2 (L) 09/15/2018 0838   CL 102 09/15/2018 [id number]  CO2 32 09/15/2018 0838   GLUCOSE 103 (H) 09/15/2018 0838   BUN 22 09/15/2018 0838   CREATININE 1.00 06/12/2019 1913   CALCIUM 9.8 09/15/2018 0838   PROT 7.2 09/15/2018 0838   ALBUMIN 4.5 09/15/2018 0838   AST 24 09/15/2018 0838   ALT 29 09/15/2018 0838   ALKPHOS 72 09/15/2018 0838   BILITOT 0.4 09/15/2018 0838   GFRNONAA 77.07 08/27/2010 0933   Lab Results  Component Value Date   WBC 5.4 09/15/2018   HGB 14.0 09/15/2018   HCT 41.2 09/15/2018   MCV 91.9 09/15/2018   PLT 194.0 09/15/2018    Imaging Studies: US Transvaginal Non-ob   Result Date: 08/02/2019 Patient Name: Beth Fox DOB: 04-Jun-1951 MRN: [id number] ULTRASOUND REPORT Location: [facility] Date of Service: 08/02/2019 Indications:Right ovarian cysts Findings: The uterus is axial and measures 5.4 x 3.2 x 2.6 cm. Echo texture is heterogenous without evidence of focal masses. The Endometrium measures 3.9 mm. Right Ovary measures 5.2 x 3.6 x 3.0 cm. It is not normal in appearance. There are two simple cysts in the right ovary measuring 21 x 18 x 22 mm and 34 x 30 x 32 mm. Left Ovary measures 2.0 x 1.1 x 1.0 cm. It is normal in appearance. Survey of the adnexa demonstrates no adnexal masses. There is no free fluid in the cul de sac. Impression: 1. Normal uterus and cervix. 2. There are two simple cysts in the right ovary. 3. Normal left ovary. Recommendations: 1.Clinical correlation with the patient's History and Physical Exam Gweneth Dimitri, RT Images reviewed and compared to CT abdomen and pelvis dated 06/12/2019.  There are two adjacent simple cysts, stable in size (2.2 and 3.3cm on CT).  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   Otherwise normal GYN study.  Malachy Mood, MD, [facility], [facility] 08/02/2019, 2:46 PM    Assessment and Plan:   Beth Fox is a 68 y.o. y/o female here for follow up episodic N/V due to large hiatal hernia   Assessment and Plan: Continue plans as per surgery for hiatal hernia repair  Does not want colonoscopy and will follow up with PCP with cologuard testing  Will follow up with [place] as needed and encouraged to call us if symptoms do not get better with surgery   Follow Up Instructions: PRN   I discussed the assessment and treatment plan with the patient. The patient was provided an opportunity to ask questions and all were answered. The patient agreed with the plan and demonstrated an understanding of the instructions.   The patient was advised to call  back or seek an in-person evaluation if the symptoms worsen or if the condition fails to improve as anticipated.  I provided 8 minutes of non-face-to-face time during this encounter. Additional time was spent in reviewing patient's chart, placing orders etc.   Virgel Manifold, MD  Speech recognition software was used to dictate this note.

## 2019-08-23 DIAGNOSIS — Z20828 Contact with and (suspected) exposure to other viral communicable diseases: Secondary | ICD-10-CM | POA: Diagnosis not present

## 2019-08-23 DIAGNOSIS — Z01818 Encounter for other preprocedural examination: Secondary | ICD-10-CM | POA: Diagnosis not present

## 2019-08-24 DIAGNOSIS — K449 Diaphragmatic hernia without obstruction or gangrene: Secondary | ICD-10-CM | POA: Diagnosis not present

## 2019-08-25 ENCOUNTER — Other Ambulatory Visit: Payer: Self-pay | Admitting: Family Medicine

## 2019-08-25 DIAGNOSIS — R1310 Dysphagia, unspecified: Secondary | ICD-10-CM | POA: Diagnosis present

## 2019-08-25 DIAGNOSIS — K219 Gastro-esophageal reflux disease without esophagitis: Secondary | ICD-10-CM | POA: Diagnosis present

## 2019-08-25 DIAGNOSIS — J95811 Postprocedural pneumothorax: Secondary | ICD-10-CM | POA: Diagnosis not present

## 2019-08-25 DIAGNOSIS — F329 Major depressive disorder, single episode, unspecified: Secondary | ICD-10-CM | POA: Diagnosis present

## 2019-08-25 DIAGNOSIS — K449 Diaphragmatic hernia without obstruction or gangrene: Secondary | ICD-10-CM | POA: Diagnosis present

## 2019-09-05 ENCOUNTER — Other Ambulatory Visit: Payer: Self-pay

## 2019-09-25 ENCOUNTER — Telehealth: Payer: Self-pay | Admitting: Family Medicine

## 2019-09-25 DIAGNOSIS — E7800 Pure hypercholesterolemia, unspecified: Secondary | ICD-10-CM

## 2019-09-25 DIAGNOSIS — R7309 Other abnormal glucose: Secondary | ICD-10-CM

## 2019-09-25 DIAGNOSIS — E559 Vitamin D deficiency, unspecified: Secondary | ICD-10-CM

## 2019-09-25 DIAGNOSIS — M8580 Other specified disorders of bone density and structure, unspecified site: Secondary | ICD-10-CM

## 2019-09-25 NOTE — Telephone Encounter (Signed)
-----   Message from Ellamae Sia sent at 09/19/2019  2:41 PM EST ----- Regarding: lab orders for Tuesday, 12.15.20 Patient is scheduled for CPX labs, please order future labs, Thanks , Karna Christmas

## 2019-09-26 ENCOUNTER — Other Ambulatory Visit (INDEPENDENT_AMBULATORY_CARE_PROVIDER_SITE_OTHER): Payer: Medicare Other

## 2019-09-26 ENCOUNTER — Other Ambulatory Visit: Payer: Self-pay

## 2019-09-26 ENCOUNTER — Telehealth: Payer: Self-pay

## 2019-09-26 ENCOUNTER — Ambulatory Visit: Payer: Medicare Other

## 2019-09-26 DIAGNOSIS — E559 Vitamin D deficiency, unspecified: Secondary | ICD-10-CM | POA: Diagnosis not present

## 2019-09-26 DIAGNOSIS — E7800 Pure hypercholesterolemia, unspecified: Secondary | ICD-10-CM

## 2019-09-26 DIAGNOSIS — R7309 Other abnormal glucose: Secondary | ICD-10-CM

## 2019-09-26 LAB — COMPREHENSIVE METABOLIC PANEL
ALT: 16 U/L (ref 0–35)
AST: 16 U/L (ref 0–37)
Albumin: 4.1 g/dL (ref 3.5–5.2)
Alkaline Phosphatase: 88 U/L (ref 39–117)
BUN: 16 mg/dL (ref 6–23)
CO2: 28 mEq/L (ref 19–32)
Calcium: 9.7 mg/dL (ref 8.4–10.5)
Chloride: 104 mEq/L (ref 96–112)
Creatinine, Ser: 0.93 mg/dL (ref 0.40–1.20)
GFR: 60.04 mL/min (ref >60.00)
Glucose, Bld: 102 mg/dL — ABNORMAL HIGH (ref 70–99)
Potassium: 3.8 mEq/L (ref 3.5–5.1)
Sodium: 140 mEq/L (ref 135–145)
Total Bilirubin: 0.4 mg/dL (ref 0.2–1.2)
Total Protein: 6.9 g/dL (ref 6.0–8.3)

## 2019-09-26 LAB — LIPID PANEL
Cholesterol: 221 mg/dL — ABNORMAL HIGH (ref 0–200)
HDL: 48.7 mg/dL (ref >39.00)
LDL Cholesterol: 143 mg/dL — ABNORMAL HIGH (ref 0–99)
NonHDL: 172.58
Total CHOL/HDL Ratio: 5
Triglycerides: 148 mg/dL (ref 0.0–149.0)
VLDL: 29.6 mg/dL (ref 0.0–40.0)

## 2019-09-26 LAB — VITAMIN D 25 HYDROXY (VIT D DEFICIENCY, FRACTURES): VITD: 59.76 ng/mL (ref 30.00–100.00)

## 2019-09-26 LAB — HEMOGLOBIN A1C: Hgb A1c MFr Bld: 5.8 % (ref 4.6–6.5)

## 2019-09-26 NOTE — Telephone Encounter (Signed)
Called patient 3 times trying to complete her Medicare Wellness visit. Patient never answered. Left message notifying patient that appointment will be cancelled and visit will be completed at her physical by the doctor.

## 2019-09-26 NOTE — Progress Notes (Signed)
No critical labs need to be addressed urgently. We will discuss labs in detail at upcoming office visit.   

## 2019-09-29 ENCOUNTER — Other Ambulatory Visit: Payer: Self-pay | Admitting: Family Medicine

## 2019-09-29 ENCOUNTER — Other Ambulatory Visit: Payer: Self-pay

## 2019-09-29 ENCOUNTER — Ambulatory Visit (INDEPENDENT_AMBULATORY_CARE_PROVIDER_SITE_OTHER): Payer: Medicare Other | Admitting: Family Medicine

## 2019-09-29 ENCOUNTER — Encounter: Payer: Self-pay | Admitting: Family Medicine

## 2019-09-29 VITALS — BP 104/70 | HR 75 | Temp 98.1°F | Ht 63.75 in | Wt 155.2 lb

## 2019-09-29 DIAGNOSIS — E7800 Pure hypercholesterolemia, unspecified: Secondary | ICD-10-CM

## 2019-09-29 DIAGNOSIS — Z23 Encounter for immunization: Secondary | ICD-10-CM | POA: Diagnosis not present

## 2019-09-29 DIAGNOSIS — I10 Essential (primary) hypertension: Secondary | ICD-10-CM | POA: Diagnosis not present

## 2019-09-29 DIAGNOSIS — F331 Major depressive disorder, recurrent, moderate: Secondary | ICD-10-CM

## 2019-09-29 DIAGNOSIS — Z0000 Encounter for general adult medical examination without abnormal findings: Secondary | ICD-10-CM | POA: Diagnosis not present

## 2019-09-29 DIAGNOSIS — R7309 Other abnormal glucose: Secondary | ICD-10-CM

## 2019-09-29 DIAGNOSIS — E559 Vitamin D deficiency, unspecified: Secondary | ICD-10-CM | POA: Diagnosis not present

## 2019-09-29 NOTE — Telephone Encounter (Signed)
Patient is scheduled for her [place] today.  Please refill after visit if appropriate.

## 2019-09-29 NOTE — Assessment & Plan Note (Signed)
Stable control. 

## 2019-09-29 NOTE — Assessment & Plan Note (Signed)
Improved on supplement weekly high dose.

## 2019-09-29 NOTE — Assessment & Plan Note (Signed)
Worsen  Control off red yeast rice. Get back on track with this and improve diet.

## 2019-09-29 NOTE — Assessment & Plan Note (Signed)
Well controlled. Continue current medication.  

## 2019-09-29 NOTE — Patient Instructions (Addendum)
Get back to regular exercise.  get back on track with low chol diet.  Restart red yeast rice. Set up mammogram on your own.

## 2019-09-29 NOTE — Progress Notes (Signed)
Chief Complaint  Patient presents with  . Medicare Wellness    History of Present Illness: HPI  The patient presents for annual medicare wellness, complete physical and review of chronic health problems. He/She also has the following acute concerns today: none  I have personally reviewed the Medicare Annual Wellness questionnaire and have noted 1. The patient's medical and social history 2. Their use of alcohol, tobacco or illicit drugs 3. Their current medications and supplements 4. The patient's functional ability including ADL's, fall risks, home safety risks and hearing or visual             impairment. 5. Diet and physical activities 6. Evidence for depression or mood disorders 7.         Updated provider list Cognitive evaluation was performed and recorded on pt medicare questionnaire form. The patients weight, height, BMI and visual acuity have been recorded in the chart  I have made referrals, counseling and provided education to the patient based review of the above and I have provided the pt with a written personalized care plan for preventive services.   Documentation of this information was scanned into the electronic record under the media tab.  Patient Care Team: Jinny Sanders, MD as PCP - General   Advance directives and end of life planning reviewed in detail with patient and documented in EMR. Patient given handout on advance care directives if needed. HCPOA and living will updated if needed.   Hearing Screening   Method: Audiometry   125Hz  250Hz  500Hz  1000Hz  2000Hz  3000Hz  4000Hz  6000Hz  8000Hz   Right ear:   20 20 20  20     Left ear:   20 25 20  20     Vision Screening Comments: Wears glasses-Eye exam 10/2018 at [facility]  09/29/2019 09/15/2018 08/12/2017 05/14/2017  Falls in the past year? 0 0 No No      Clinical Support from 09/15/2018 in [place] at [facility] Total Score  0      Hypertension:    Good control on HCTZ  BP Readings from Last 3 Encounters:  09/29/19 104/70  08/03/19 109/68  08/02/19 130/75  Using medication without problems or lightheadedness: none Chest pain with exertion:none Edema:none Short of breath:none Average home BPs: Other issues:   Wt Readings from Last 3 Encounters:  09/29/19 155 lb 4 oz (70.4 kg)  08/03/19 151 lb 2 oz (68.5 kg)  08/02/19 152 lb (68.9 kg)     Elevated Cholesterol:  She has not been taking red yeast rice. LDL now higher. Lab Results  Component Value Date   CHOL 221 (H) 09/26/2019   HDL 48.70 09/26/2019   LDLCALC 143 (H) 09/26/2019   LDLDIRECT 167.7 09/01/2013   TRIG 148.0 09/26/2019   CHOLHDL 5 09/26/2019  Using medications without problems: Muscle aches:  Diet compliance: worse Exercise: minimal Other complaints:  Prediabetes  Lab Results  Component Value Date   HGBA1C 5.8 09/26/2019    Vit D def: on  Prescription.supplement  MDD, recurrent severe: stable on sertraline 50 daily.  Personal history of breast cancer  This visit occurred during the SARS-CoV-2 public health emergency.  Safety protocols were in place, including screening questions prior to the visit, additional usage of staff PPE, and extensive cleaning of exam room while observing appropriate contact time as indicated for disinfecting solutions.   COVID 19 screen:  No recent travel or known exposure to [place] The patient denies respiratory symptoms  of COVID 19 at this time. The importance of social distancing was discussed today.     Review of Systems  Constitutional: Negative for chills and fever.  HENT: Negative for congestion and ear pain.   Eyes: Negative for pain and redness.  Respiratory: Negative for cough and shortness of breath.   Cardiovascular: Negative for chest pain, palpitations and leg swelling.  Gastrointestinal: Negative for abdominal pain, blood in stool, constipation, diarrhea, nausea and vomiting.  Genitourinary: Negative  for dysuria.  Musculoskeletal: Negative for falls and myalgias.  Skin: Negative for rash.  Neurological: Negative for dizziness.  Psychiatric/Behavioral: Negative for depression. The patient is not nervous/anxious.       Past Medical History:  Diagnosis Date  . Cancer ([place]) 1997 rt side   RIGHT mastectomy   . Personal history of chemotherapy     reports that she has never smoked. She has never used smokeless tobacco. She reports that she does not drink alcohol or use drugs.   Current Outpatient Medications:  .  Cholecalciferol (VITAMIN D3) 1.25 MG (50000 UT) CAPS, Take 1 capsule by mouth once a week., Disp: 12 capsule, Rfl: 3 .  gabapentin (NEURONTIN) 100 MG capsule, TAKE 1 CAPSULE BY MOUTH 3 TIMES DAILY. START AT 100 MG AT BEDTIME & GRADUALLY INCREASE TO 3XDAY, Disp: 270 capsule, Rfl: 1 .  hydrochlorothiazide (HYDRODIURIL) 25 MG tablet, TAKE 1 TABLET BY MOUTH EVERY DAY, Disp: 90 tablet, Rfl: 1 .  sertraline (ZOLOFT) 50 MG tablet, TAKE 1 TABLET BY MOUTH EVERY DAY, Disp: 90 tablet, Rfl: 0   Observations/Objective: Blood pressure 104/70, pulse 75, temperature 98.1 F (36.7 C), temperature source Temporal, height 5' 3.75" (1.619 m), weight 155 lb 4 oz (70.4 kg), SpO2 93 %.  Physical Exam Constitutional:      General: She is not in acute distress.    Appearance: Normal appearance. She is well-developed. She is not ill-appearing or toxic-appearing.  HENT:     Head: Normocephalic.     Right Ear: Hearing, tympanic membrane, ear canal and external ear normal.     Left Ear: Hearing, tympanic membrane, ear canal and external ear normal.     Nose: Nose normal.  Eyes:     General: Lids are normal. Lids are everted, no foreign bodies appreciated.     Conjunctiva/sclera: Conjunctivae normal.     Pupils: Pupils are equal, round, and reactive to light.  Neck:     Thyroid: No thyroid mass or thyromegaly.     Vascular: No carotid bruit.     Trachea: Trachea normal.  Cardiovascular:     Rate  and Rhythm: Normal rate and regular rhythm.     Heart sounds: Normal heart sounds, S1 normal and S2 normal. No murmur. No gallop.   Pulmonary:     Effort: Pulmonary effort is normal. No respiratory distress.     Breath sounds: Normal breath sounds. No wheezing, rhonchi or rales.  Abdominal:     General: Bowel sounds are normal. There is no distension or abdominal bruit.     Palpations: Abdomen is soft. There is no fluid wave or mass.     Tenderness: There is no abdominal tenderness. There is no guarding or rebound.     Hernia: No hernia is present.  Musculoskeletal:     Cervical back: Normal range of motion and neck supple.  Lymphadenopathy:     Cervical: No cervical adenopathy.  Skin:    General: Skin is warm and dry.     Findings: No rash.  Neurological:     Mental Status: She is alert.     Cranial Nerves: No cranial nerve deficit.     Sensory: No sensory deficit.  Psychiatric:        Mood and Affect: Mood is not anxious or depressed.        Speech: Speech normal.        Behavior: Behavior normal. Behavior is cooperative.        Judgment: Judgment normal.      Assessment and Plan   The patient's preventative maintenance and recommended screening tests for an annual wellness exam were reviewed in full today. Brought up to date unless services declined.  Counselled on the importance of diet, exercise, and its role in overall health and mortality. The patient's FH and SH was reviewed, including their home life, tobacco status, and drug and alcohol status.   Vaccines: Refused shingles. Uptodate with tdap, flu, pna23.. given 13 today Colon:Not sure when last colonoscopy done, likely over 10 years but was nml. She has opted forcologuard.. Negative in 2018 DEXA: No family history of osteoporosis, got menses average, menopause age 85, no steroid use. She is moderate risk last done 2016, osteopenia repeat in 5 years. Mammo:stable 09/2018 , personal history of breast cancer..   Needs yearly CBE ( pt opted against this 2020) Has scheduled mammo.    Pap/DVE: nml pap, neg HPVin 12/2012, no history of abnormal paps no further indicatedAsymptomatic and low risk for ovarian or uterine cancer. Nonsmoker. HEP C:done  MDD (major depressive disorder), recurrent episode, moderate ([place]) Well controlled. Continue current medication.   Benign essential hypertension Well controlled. Continue current medication.   Vitamin D deficiency Improved on supplement weekly high dose.  HYPERCHOLESTEROLEMIA Worsen  Control off red yeast rice. Get back on track with this and improve diet.  PREDIABETES  Stable control.    Eliezer Lofts, MD

## 2019-10-03 ENCOUNTER — Encounter: Payer: Medicare Other | Admitting: Family Medicine

## 2019-10-31 ENCOUNTER — Other Ambulatory Visit: Payer: Self-pay | Admitting: Family Medicine

## 2019-10-31 DIAGNOSIS — Z1231 Encounter for screening mammogram for malignant neoplasm of breast: Secondary | ICD-10-CM

## 2019-11-09 ENCOUNTER — Other Ambulatory Visit: Payer: Self-pay | Admitting: Family Medicine

## 2019-11-13 ENCOUNTER — Telehealth: Payer: Self-pay

## 2019-11-13 NOTE — Telephone Encounter (Signed)
Pt left v/m that she is out of omeprazole and cannot find the omeprazole bottle. Pt request new rx of omeprazole sent to [facility] Please see 11/09/19 refillnote. 11/09/19 refill request for omeprazole was denied due to the original prescription was d/c on 05/23/2019 by Bonna Gains per Butch Penny CMA. Pt had [place] on 09/29/19.Please advise.

## 2019-11-14 MED ORDER — OMEPRAZOLE 20 MG PO CPDR: 20.0000 mg | DELAYED_RELEASE_CAPSULE | Freq: Two times a day (BID) | ORAL | 3 refills | Status: DC

## 2019-11-14 NOTE — Telephone Encounter (Signed)
Okay to refill as requested.

## 2019-11-14 NOTE — Telephone Encounter (Signed)
Omeprazole prescription sent to pharmacy as instructed by Dr. Diona Browner.

## 2019-11-16 ENCOUNTER — Other Ambulatory Visit: Payer: Self-pay | Admitting: Family Medicine

## 2019-11-24 ENCOUNTER — Other Ambulatory Visit: Payer: Self-pay | Admitting: Family Medicine

## 2019-12-12 DIAGNOSIS — Z9889 Other specified postprocedural states: Secondary | ICD-10-CM | POA: Diagnosis not present

## 2019-12-12 DIAGNOSIS — Z8719 Personal history of other diseases of the digestive system: Secondary | ICD-10-CM | POA: Diagnosis not present

## 2019-12-12 DIAGNOSIS — K219 Gastro-esophageal reflux disease without esophagitis: Secondary | ICD-10-CM | POA: Diagnosis not present

## 2019-12-22 ENCOUNTER — Ambulatory Visit
Admission: RE | Admit: 2019-12-22 | Discharge: 2019-12-22 | Disposition: A | Discharge status: Home / Self Care | Payer: Medicare Other | Source: Ambulatory Visit | Attending: Family Medicine | Admitting: Family Medicine

## 2019-12-22 DIAGNOSIS — Z1231 Encounter for screening mammogram for malignant neoplasm of breast: Secondary | ICD-10-CM | POA: Insufficient documentation

## 2019-12-28 DIAGNOSIS — M7062 Trochanteric bursitis, left hip: Secondary | ICD-10-CM | POA: Diagnosis not present

## 2020-01-24 ENCOUNTER — Other Ambulatory Visit: Payer: Self-pay

## 2020-01-24 ENCOUNTER — Ambulatory Visit (INDEPENDENT_AMBULATORY_CARE_PROVIDER_SITE_OTHER): Payer: Medicare Other

## 2020-01-24 ENCOUNTER — Other Ambulatory Visit: Payer: Self-pay | Admitting: Podiatry

## 2020-01-24 ENCOUNTER — Encounter: Payer: Self-pay | Admitting: Podiatry

## 2020-01-24 ENCOUNTER — Ambulatory Visit (INDEPENDENT_AMBULATORY_CARE_PROVIDER_SITE_OTHER): Payer: Medicare Other | Admitting: Podiatry

## 2020-01-24 DIAGNOSIS — M7752 Other enthesopathy of left foot: Secondary | ICD-10-CM

## 2020-01-24 DIAGNOSIS — M722 Plantar fascial fibromatosis: Secondary | ICD-10-CM

## 2020-01-24 MED ORDER — MELOXICAM 15 MG PO TABS: 15.0000 mg | ORAL_TABLET | Freq: Every day | ORAL | 3 refills | Status: DC

## 2020-01-24 NOTE — Progress Notes (Signed)
She presents today with lateral ankle pain states is been aching and swelling for the past 3 weeks denies any injury to it dates that her right hip was just recently had an injection as well as the left the left 1 is not getting any better however.  She states then getting sharp shooting stinging pains occasionally around the ankle and they almost made me fall.  Objective: Vital signs are stable alert oriented x3 there is no erythema edema cellulitis drainage odor she has pain on palpation of the sinus tarsi and pain on end range of motion of the subtalar joint.  Radiographs taken today of the ankle demonstrate only normal osseous architecture with exception of a small osteophyte along the inferior lateral malleolar region.  This appears to be old but does not appear to be arthritic there is a small area of ossification there.  Everything else looks normal.  Assessment: Subtalar joint capsulitis.  Plan: Discussed etiology pathology conservative surgical therapies at this point injected subtalar joint left with 20 mg Kenalog 5 mg Marcaine point of maximal tenderness.  We discussed appropriate shoe gear and I will follow-up with her in 1 month if necessary.

## 2020-01-31 ENCOUNTER — Other Ambulatory Visit: Payer: Self-pay

## 2020-01-31 ENCOUNTER — Encounter: Payer: Self-pay | Admitting: Obstetrics and Gynecology

## 2020-01-31 ENCOUNTER — Ambulatory Visit (INDEPENDENT_AMBULATORY_CARE_PROVIDER_SITE_OTHER): Payer: Medicare Other | Admitting: Obstetrics and Gynecology

## 2020-01-31 ENCOUNTER — Ambulatory Visit (INDEPENDENT_AMBULATORY_CARE_PROVIDER_SITE_OTHER): Payer: Medicare Other

## 2020-01-31 ENCOUNTER — Other Ambulatory Visit: Payer: Self-pay | Admitting: Obstetrics and Gynecology

## 2020-01-31 VITALS — BP 142/88 | Ht 63.75 in | Wt 167.0 lb

## 2020-01-31 DIAGNOSIS — N7011 Chronic salpingitis: Secondary | ICD-10-CM

## 2020-01-31 DIAGNOSIS — N83201 Unspecified ovarian cyst, right side: Secondary | ICD-10-CM | POA: Diagnosis not present

## 2020-01-31 NOTE — Progress Notes (Signed)
Gynecology Ultrasound Follow Up  Chief Complaint:  Chief Complaint  Patient presents with  . Ovarian Cyst     History of Present Illness: Patient is a 69 y.o. female who presents today for ultrasound evaluation of right hydrosalpinx.  Ultrasound demonstrates the following findgins Adnexa: Right adnexa with stable hydrosalpinx Uterus: Non-enlarged with endometrial stripe thin Additional: no free fluid  Review of Systems: Review of Systems  Constitutional: Negative.   Gastrointestinal: Negative.   Genitourinary: Negative.     Past Medical History:  Past Medical History:  Diagnosis Date  . Cancer ([place]) 1997 rt side   RIGHT mastectomy   . Personal history of chemotherapy     Past Surgical History:  Past Surgical History:  Procedure Laterality Date  . APPENDECTOMY    . BREAST SURGERY     mastectomy on right   . FOOT SURGERY Left 06/2018   Dr. Milinda Pointer  . MASTECTOMY Right Oct 1997   c Reconstruction surgery on Right Breast  . REDUCTION MAMMAPLASTY Left 2012  . TUBAL LIGATION      Gynecologic History:  No LMP recorded. Patient is postmenopausal. Contraception: post menopausal status  Family History:  Family History  Problem Relation Age of Onset  . Diabetes Mother   . Hypertension Mother   . Stroke Mother   . Cancer Father        melonoma  . Diabetes Father   . Hypertension Father   . Parkinsonism Father   . Breast cancer Maternal Aunt   . Breast cancer Paternal Aunt   . Breast cancer Cousin   . Hypertension Sister   . Arthritis Sister   . Stroke Maternal Grandmother   . Stroke Paternal Grandmother   . Heart attack Paternal Grandfather        before age 50  . Hypertension Sister   . Arthritis Sister   . Breast cancer Cousin     Social History:  Social History   Socioeconomic History  . Marital status: Married    Spouse name: Not on file  . Number of children: 0  . Years of education: Not on file  . Highest education level: Not on file    Occupational History  . Occupation: Scientist, clinical (histocompatibility and immunogenetics): [employer]  Tobacco Use  . Smoking status: Never Smoker  . Smokeless tobacco: Never Used  Substance and Sexual Activity  . Alcohol use: No    Alcohol/week: 0.0 standard drinks  . Drug use: No  . Sexual activity: Not Currently    Birth control/protection: Post-menopausal  Other Topics Concern  . Not on file  Social History Narrative   Regular exercise--no   Diet: fast food and sweets   Social Determinants of Health   Financial Resource Strain:   . Difficulty of Paying Living Expenses:   Food Insecurity:   . Worried About Charity fundraiser in the Last Year:   . Arboriculturist in the Last Year:   Transportation Needs:   . Film/video editor (Medical):   Marland Kitchen Lack of Transportation (Non-Medical):   Physical Activity:   . Days of Exercise per Week:   . Minutes of Exercise per Session:   Stress:   . Feeling of Stress :   Social Connections:   . Frequency of Communication with Friends and Family:   . Frequency of Social Gatherings with Friends and Family:   . Attends Religious Services:   . Active Member of Clubs or Organizations:   .  Attends Archivist Meetings:   Marland Kitchen Marital Status:   Intimate Partner Violence:   . Fear of Current or Ex-Partner:   . Emotionally Abused:   Marland Kitchen Physically Abused:   . Sexually Abused:     Allergies:  No Known Allergies  Medications: Prior to Admission medications   Medication Sig Start Date End Date Taking? Authorizing Provider  gabapentin (NEURONTIN) 100 MG capsule TAKE 1 CAPSULE BY MOUTH 3 TIMES DAILY. START AT 100 MG AT BEDTIME & GRADUALLY INCREASE TO 3XDAY 09/29/19  Yes Bedsole, Amy E, MD  hydrochlorothiazide (HYDRODIURIL) 25 MG tablet TAKE 1 TABLET BY MOUTH EVERY DAY 11/16/19  Yes Bedsole, Amy E, MD  meloxicam (MOBIC) 15 MG tablet Take 1 tablet (15 mg total) by mouth daily. 01/24/20  Yes Hyatt, Max T, DPM  omeprazole (PRILOSEC) 20 MG capsule Take 1 capsule (20  mg total) by mouth 2 (two) times daily before a meal. 11/14/19  Yes Bedsole, Amy E, MD  sertraline (ZOLOFT) 50 MG tablet TAKE 1 TABLET BY MOUTH EVERY DAY 11/24/19  Yes Bedsole, Amy E, MD    Physical Exam Vitals: Blood pressure (!) 142/88, height 5' 3.75" (1.619 m), weight 167 lb (75.8 kg).  General: NAD HEENT: normocephalic, anicteric Pulmonary: No increased work of breathing Extremities: no edema, erythema, or tenderness Neurologic: Grossly intact, normal gait Psychiatric: mood appropriate, affect full  DG Foot Complete Left  Result Date: 01/24/2020 Please see detailed radiograph report in office note.  US PELVIC COMPLETE WITH TRANSVAGINAL  Result Date: 01/31/2020 Patient Name: Beth Fox DOB: Mar 06, 1951 MRN: [id number] ULTRASOUND REPORT Location: [facility] Date of Service: 01/31/2020 Indications: Right Ovary Cysts Findings: The uterus is anteverted and measures 4.6 x 2.7 x 1.9 cm. Echo texture is homogenous without evidence of focal masses. The Endometrium measures 1.1 mm. 1.8 mm of anechoic fluid within the fundal portion of the endometrial canal. Right Ovary measures 2.9 x 2.6 x 1.8 cm. It is not normal in appearance. There is a simple cyst in the right ovary measuring 23.5 x 18.9 x 21.2 mm. There is another, larger, simple cyst either within the right ovary or adjacent/medial to it measuring 31.9 x 28.3 x 34.5 mm. No blood flow is seen within either cyst. Left Ovary not visualized. Survey of the adnexa demonstrates no adnexal masses. There is no free fluid in the cul de sac. Impression: 1. There is a small amount of anechoic fluid within the fundal portion of the endometrial canal. Thin endometrium. 2. There are two simple cysts  In the right adnexa. At least one is within the right ovary. 3. The left ovary is not visualized. Recommendations: 1.Clinical correlation with the patient's History and Physical Exam. Gweneth Dimitri, RT Ultrasound reviewed and stable right hydrosalpinx  with the two cystic components remaining simple in appearance.  21 x 18 x 70mm and 34 x 30 x 62mm on 07/2019 and now 23.5 x 18.9 x 21.35mm and 31.0 x 28.3 x 34.62mm.  Malachy Mood, MD, [facility], [facility] 01/31/2020, 10:30 AM    Assessment: 69 y.o. G1P0010 follow up ultrasound right adnexal cystic process  Plan: Problem List Items Addressed This Visit    None    Visit Diagnoses    Hydrosalpinx    -  Primary      1) Right adnexa cystic process - appearance still looks consistent with hydrosalpinx.  Previous ROMA negative.  Appearance remains stable with no interval increase in size or any complex features.  No further follow at this time unless becomes symptomatic  2) A total of 15 minutes were spent in face-to-face contact with the patient during this encounter with over half of that time devoted to counseling and coordination of care.   3) Return in about 1 year (around 01/30/2021) for annual.    Malachy Mood, MD, [place], [facility] 01/31/2020, 10:50 AM

## 2020-02-19 DIAGNOSIS — M47816 Spondylosis without myelopathy or radiculopathy, lumbar region: Secondary | ICD-10-CM | POA: Diagnosis not present

## 2020-02-19 DIAGNOSIS — M5442 Lumbago with sciatica, left side: Secondary | ICD-10-CM | POA: Diagnosis not present

## 2020-02-19 DIAGNOSIS — M5136 Other intervertebral disc degeneration, lumbar region: Secondary | ICD-10-CM | POA: Diagnosis not present

## 2020-02-19 DIAGNOSIS — M5416 Radiculopathy, lumbar region: Secondary | ICD-10-CM | POA: Diagnosis not present

## 2020-02-20 DIAGNOSIS — M5442 Lumbago with sciatica, left side: Secondary | ICD-10-CM | POA: Diagnosis not present

## 2020-02-20 DIAGNOSIS — M5416 Radiculopathy, lumbar region: Secondary | ICD-10-CM | POA: Diagnosis not present

## 2020-02-20 DIAGNOSIS — M5136 Other intervertebral disc degeneration, lumbar region: Secondary | ICD-10-CM | POA: Diagnosis not present

## 2020-02-22 DIAGNOSIS — M5136 Other intervertebral disc degeneration, lumbar region: Secondary | ICD-10-CM | POA: Diagnosis not present

## 2020-02-22 DIAGNOSIS — M5416 Radiculopathy, lumbar region: Secondary | ICD-10-CM | POA: Diagnosis not present

## 2020-02-22 DIAGNOSIS — M5442 Lumbago with sciatica, left side: Secondary | ICD-10-CM | POA: Diagnosis not present

## 2020-02-26 DIAGNOSIS — M5136 Other intervertebral disc degeneration, lumbar region: Secondary | ICD-10-CM | POA: Diagnosis not present

## 2020-02-26 DIAGNOSIS — M5416 Radiculopathy, lumbar region: Secondary | ICD-10-CM | POA: Diagnosis not present

## 2020-02-26 DIAGNOSIS — M5442 Lumbago with sciatica, left side: Secondary | ICD-10-CM | POA: Diagnosis not present

## 2020-02-27 DIAGNOSIS — M5442 Lumbago with sciatica, left side: Secondary | ICD-10-CM | POA: Diagnosis not present

## 2020-02-27 DIAGNOSIS — M5416 Radiculopathy, lumbar region: Secondary | ICD-10-CM | POA: Diagnosis not present

## 2020-02-27 DIAGNOSIS — M5136 Other intervertebral disc degeneration, lumbar region: Secondary | ICD-10-CM | POA: Diagnosis not present

## 2020-02-28 ENCOUNTER — Other Ambulatory Visit: Payer: Self-pay

## 2020-02-28 ENCOUNTER — Encounter: Payer: Self-pay | Admitting: Podiatry

## 2020-02-28 ENCOUNTER — Ambulatory Visit (INDEPENDENT_AMBULATORY_CARE_PROVIDER_SITE_OTHER): Payer: Medicare Other | Admitting: Podiatry

## 2020-02-28 DIAGNOSIS — M7752 Other enthesopathy of left foot: Secondary | ICD-10-CM

## 2020-02-28 NOTE — Progress Notes (Signed)
She presents today for follow-up of her subtalar joint capsulitis states that the left is really not any better.  Objective: Vital signs are stable she is alert and oriented x3.  States that the pain is directly here as she points to the sinus tarsi and I replicate the pain by pressing on the sinus tarsi.  She has good range of motion of the subtalar joint with no impingement at this point.  Which seems to be better than last evaluation.  Assessment: Capsulitis subtalar joint capsulitis left.  Plan: I injected subtalar joint today after sterile Betadine skin prep I will follow-up with her in 1 month if not improved an MRI will be necessary.

## 2020-02-29 DIAGNOSIS — M5416 Radiculopathy, lumbar region: Secondary | ICD-10-CM | POA: Diagnosis not present

## 2020-02-29 DIAGNOSIS — M5442 Lumbago with sciatica, left side: Secondary | ICD-10-CM | POA: Diagnosis not present

## 2020-02-29 DIAGNOSIS — M5136 Other intervertebral disc degeneration, lumbar region: Secondary | ICD-10-CM | POA: Diagnosis not present

## 2020-03-05 DIAGNOSIS — M5416 Radiculopathy, lumbar region: Secondary | ICD-10-CM | POA: Diagnosis not present

## 2020-03-05 DIAGNOSIS — M5442 Lumbago with sciatica, left side: Secondary | ICD-10-CM | POA: Diagnosis not present

## 2020-03-05 DIAGNOSIS — M5136 Other intervertebral disc degeneration, lumbar region: Secondary | ICD-10-CM | POA: Diagnosis not present

## 2020-03-07 DIAGNOSIS — M5416 Radiculopathy, lumbar region: Secondary | ICD-10-CM | POA: Diagnosis not present

## 2020-03-07 DIAGNOSIS — M5136 Other intervertebral disc degeneration, lumbar region: Secondary | ICD-10-CM | POA: Diagnosis not present

## 2020-03-07 DIAGNOSIS — M5442 Lumbago with sciatica, left side: Secondary | ICD-10-CM | POA: Diagnosis not present

## 2020-03-12 DIAGNOSIS — M5416 Radiculopathy, lumbar region: Secondary | ICD-10-CM | POA: Diagnosis not present

## 2020-03-12 DIAGNOSIS — M5442 Lumbago with sciatica, left side: Secondary | ICD-10-CM | POA: Diagnosis not present

## 2020-03-12 DIAGNOSIS — M5136 Other intervertebral disc degeneration, lumbar region: Secondary | ICD-10-CM | POA: Diagnosis not present

## 2020-03-14 DIAGNOSIS — M5442 Lumbago with sciatica, left side: Secondary | ICD-10-CM | POA: Diagnosis not present

## 2020-03-14 DIAGNOSIS — M5416 Radiculopathy, lumbar region: Secondary | ICD-10-CM | POA: Diagnosis not present

## 2020-03-14 DIAGNOSIS — M5136 Other intervertebral disc degeneration, lumbar region: Secondary | ICD-10-CM | POA: Diagnosis not present

## 2020-03-18 ENCOUNTER — Telehealth: Payer: Self-pay | Admitting: *Deleted

## 2020-03-18 NOTE — Telephone Encounter (Signed)
Dr. Milinda Pointer ordered MRI of Left ankle for this [place] pt.

## 2020-03-19 DIAGNOSIS — M5136 Other intervertebral disc degeneration, lumbar region: Secondary | ICD-10-CM | POA: Diagnosis not present

## 2020-03-19 DIAGNOSIS — M5416 Radiculopathy, lumbar region: Secondary | ICD-10-CM | POA: Diagnosis not present

## 2020-03-19 DIAGNOSIS — M5442 Lumbago with sciatica, left side: Secondary | ICD-10-CM | POA: Diagnosis not present

## 2020-03-21 ENCOUNTER — Telehealth: Payer: Self-pay

## 2020-03-21 DIAGNOSIS — M7752 Other enthesopathy of left foot: Secondary | ICD-10-CM

## 2020-03-21 DIAGNOSIS — M5416 Radiculopathy, lumbar region: Secondary | ICD-10-CM | POA: Diagnosis not present

## 2020-03-21 DIAGNOSIS — M5136 Other intervertebral disc degeneration, lumbar region: Secondary | ICD-10-CM | POA: Diagnosis not present

## 2020-03-21 DIAGNOSIS — M5442 Lumbago with sciatica, left side: Secondary | ICD-10-CM | POA: Diagnosis not present

## 2020-03-21 NOTE — Telephone Encounter (Signed)
Per Medicare guidelines, no precert is required. Patient has been notified via voicemail to call scheduling and set up appt to her convenience.

## 2020-03-21 NOTE — Telephone Encounter (Signed)
-----   Message from Andres Ege, RN sent at 03/21/2020 10:25 AM EDT ----- Janace Hoard, please assist pt with the MRI orders. Pt is calling for status. Marcy Siren ----- Message ----- From: Viviana Simpler, [facility] Sent: 03/20/2020   2:49 PM EDT To: Andres Ege, RN  Hey, just wanted to make sure patient has an order for the MRI. Lattie Haw ----- Message ----- From: Garrel Ridgel, [place] Sent: 03/15/2020   5:22 PM EDT To: Viviana Simpler, PMAC  No just schedule for MRi ----- Message ----- From: Viviana Simpler, [facility] Sent: 03/15/2020   2:53 PM EDT To: Avanell Shackleton Prevette, PMAC, Max T Milinda Pointer, DPM  Hey, patient called and stated that the ankle is not any better and wanted to know could patient try something else and have it called in, patient's phone number is [phone]. Lattie Haw

## 2020-03-26 ENCOUNTER — Other Ambulatory Visit: Payer: Self-pay | Admitting: Family Medicine

## 2020-03-26 DIAGNOSIS — M5442 Lumbago with sciatica, left side: Secondary | ICD-10-CM | POA: Diagnosis not present

## 2020-03-26 DIAGNOSIS — M5136 Other intervertebral disc degeneration, lumbar region: Secondary | ICD-10-CM | POA: Diagnosis not present

## 2020-03-26 DIAGNOSIS — M5416 Radiculopathy, lumbar region: Secondary | ICD-10-CM | POA: Diagnosis not present

## 2020-03-26 NOTE — Telephone Encounter (Signed)
Last office visit 09/29/2019 for Beth Fox.  Last refilled 09/29/2019 for #270 with 1 refill.  CPE scheduled for 10/03/2020.

## 2020-04-01 ENCOUNTER — Ambulatory Visit: Payer: Medicare Other | Admitting: Podiatry

## 2020-04-01 ENCOUNTER — Other Ambulatory Visit: Payer: Self-pay | Admitting: Physical Medicine and Rehabilitation

## 2020-04-01 DIAGNOSIS — M5416 Radiculopathy, lumbar region: Secondary | ICD-10-CM

## 2020-04-04 ENCOUNTER — Ambulatory Visit
Admission: RE | Admit: 2020-04-04 | Discharge: 2020-04-04 | Disposition: A | Discharge status: Home / Self Care | Payer: Medicare Other | Source: Ambulatory Visit | Attending: Podiatry | Admitting: Podiatry

## 2020-04-04 ENCOUNTER — Ambulatory Visit
Admission: RE | Admit: 2020-04-04 | Discharge: 2020-04-04 | Disposition: A | Discharge status: Home / Self Care | Payer: Medicare Other | Source: Ambulatory Visit | Attending: Physical Medicine and Rehabilitation | Admitting: Physical Medicine and Rehabilitation

## 2020-04-04 ENCOUNTER — Other Ambulatory Visit: Payer: Self-pay

## 2020-04-04 DIAGNOSIS — M5416 Radiculopathy, lumbar region: Secondary | ICD-10-CM

## 2020-04-04 DIAGNOSIS — M7752 Other enthesopathy of left foot: Secondary | ICD-10-CM | POA: Insufficient documentation

## 2020-04-09 ENCOUNTER — Telehealth: Payer: Self-pay | Admitting: *Deleted

## 2020-04-09 NOTE — Telephone Encounter (Signed)
-----   Message from Garrel Ridgel, [place] sent at 04/07/2020  9:17 PM EDT ----- Mateo Flow please send this for an over read and inform patient of the delay.

## 2020-04-09 NOTE — Telephone Encounter (Signed)
I informed pt of Dr. Stephenie Acres request to send a copy of the MRI disc to a radiology specialist for more details for treatment planning, there would be a 10-14 day delay in final results but once received we would call with instructions. Faxed request for MRI disc to [facility].

## 2020-04-18 NOTE — Telephone Encounter (Signed)
Received MRI disc copy and mailed to [facility].

## 2020-04-23 DIAGNOSIS — M5416 Radiculopathy, lumbar region: Secondary | ICD-10-CM | POA: Diagnosis not present

## 2020-04-23 DIAGNOSIS — M5442 Lumbago with sciatica, left side: Secondary | ICD-10-CM | POA: Diagnosis not present

## 2020-04-23 DIAGNOSIS — M5136 Other intervertebral disc degeneration, lumbar region: Secondary | ICD-10-CM | POA: Diagnosis not present

## 2020-05-02 DIAGNOSIS — M5416 Radiculopathy, lumbar region: Secondary | ICD-10-CM | POA: Diagnosis not present

## 2020-05-02 DIAGNOSIS — M5136 Other intervertebral disc degeneration, lumbar region: Secondary | ICD-10-CM | POA: Diagnosis not present

## 2020-05-02 DIAGNOSIS — M5442 Lumbago with sciatica, left side: Secondary | ICD-10-CM | POA: Diagnosis not present

## 2020-05-07 DIAGNOSIS — M5416 Radiculopathy, lumbar region: Secondary | ICD-10-CM | POA: Diagnosis not present

## 2020-05-07 DIAGNOSIS — M5136 Other intervertebral disc degeneration, lumbar region: Secondary | ICD-10-CM | POA: Diagnosis not present

## 2020-05-07 DIAGNOSIS — M5442 Lumbago with sciatica, left side: Secondary | ICD-10-CM | POA: Diagnosis not present

## 2020-05-09 DIAGNOSIS — M5416 Radiculopathy, lumbar region: Secondary | ICD-10-CM | POA: Diagnosis not present

## 2020-05-09 DIAGNOSIS — M5442 Lumbago with sciatica, left side: Secondary | ICD-10-CM | POA: Diagnosis not present

## 2020-05-09 DIAGNOSIS — M5136 Other intervertebral disc degeneration, lumbar region: Secondary | ICD-10-CM | POA: Diagnosis not present

## 2020-05-14 DIAGNOSIS — M5442 Lumbago with sciatica, left side: Secondary | ICD-10-CM | POA: Diagnosis not present

## 2020-05-14 DIAGNOSIS — M5416 Radiculopathy, lumbar region: Secondary | ICD-10-CM | POA: Diagnosis not present

## 2020-05-14 DIAGNOSIS — M5136 Other intervertebral disc degeneration, lumbar region: Secondary | ICD-10-CM | POA: Diagnosis not present

## 2020-05-15 ENCOUNTER — Other Ambulatory Visit: Payer: Self-pay | Admitting: Family Medicine

## 2020-05-17 DIAGNOSIS — M5442 Lumbago with sciatica, left side: Secondary | ICD-10-CM | POA: Diagnosis not present

## 2020-05-17 DIAGNOSIS — M5416 Radiculopathy, lumbar region: Secondary | ICD-10-CM | POA: Diagnosis not present

## 2020-05-17 DIAGNOSIS — M5136 Other intervertebral disc degeneration, lumbar region: Secondary | ICD-10-CM | POA: Diagnosis not present

## 2020-05-20 DIAGNOSIS — M5442 Lumbago with sciatica, left side: Secondary | ICD-10-CM | POA: Diagnosis not present

## 2020-05-20 DIAGNOSIS — M5136 Other intervertebral disc degeneration, lumbar region: Secondary | ICD-10-CM | POA: Diagnosis not present

## 2020-05-20 DIAGNOSIS — M5416 Radiculopathy, lumbar region: Secondary | ICD-10-CM | POA: Diagnosis not present

## 2020-05-23 ENCOUNTER — Other Ambulatory Visit: Payer: Self-pay | Admitting: Podiatry

## 2020-05-23 DIAGNOSIS — M5416 Radiculopathy, lumbar region: Secondary | ICD-10-CM | POA: Diagnosis not present

## 2020-05-23 DIAGNOSIS — M5136 Other intervertebral disc degeneration, lumbar region: Secondary | ICD-10-CM | POA: Diagnosis not present

## 2020-05-27 DIAGNOSIS — M5442 Lumbago with sciatica, left side: Secondary | ICD-10-CM | POA: Diagnosis not present

## 2020-05-27 DIAGNOSIS — M5416 Radiculopathy, lumbar region: Secondary | ICD-10-CM | POA: Diagnosis not present

## 2020-05-27 DIAGNOSIS — M5136 Other intervertebral disc degeneration, lumbar region: Secondary | ICD-10-CM | POA: Diagnosis not present

## 2020-06-03 DIAGNOSIS — M5416 Radiculopathy, lumbar region: Secondary | ICD-10-CM | POA: Diagnosis not present

## 2020-06-03 DIAGNOSIS — M5442 Lumbago with sciatica, left side: Secondary | ICD-10-CM | POA: Diagnosis not present

## 2020-06-03 DIAGNOSIS — M5136 Other intervertebral disc degeneration, lumbar region: Secondary | ICD-10-CM | POA: Diagnosis not present

## 2020-06-05 ENCOUNTER — Other Ambulatory Visit: Payer: Self-pay | Admitting: Family Medicine

## 2020-06-12 ENCOUNTER — Encounter: Payer: Self-pay | Admitting: *Deleted

## 2020-06-12 NOTE — Progress Notes (Addendum)
[facility] MRI   RESULTS SCANNED INTO  MEDIA.

## 2020-06-12 NOTE — Progress Notes (Signed)
Entered in error

## 2020-06-20 DIAGNOSIS — M5136 Other intervertebral disc degeneration, lumbar region: Secondary | ICD-10-CM | POA: Diagnosis not present

## 2020-06-20 DIAGNOSIS — M5416 Radiculopathy, lumbar region: Secondary | ICD-10-CM | POA: Diagnosis not present

## 2020-06-26 ENCOUNTER — Other Ambulatory Visit: Payer: Self-pay

## 2020-06-26 ENCOUNTER — Encounter: Payer: Self-pay | Admitting: Podiatry

## 2020-06-26 ENCOUNTER — Ambulatory Visit (INDEPENDENT_AMBULATORY_CARE_PROVIDER_SITE_OTHER): Payer: Medicare Other | Admitting: Podiatry

## 2020-06-26 DIAGNOSIS — M7752 Other enthesopathy of left foot: Secondary | ICD-10-CM

## 2020-06-26 NOTE — Progress Notes (Signed)
She presents today for follow-up of her MRI states that her foot is doing a little better as she refers to the left ankle.  Objective: Vital signs are stable alert and oriented x3.  There pulses are palpable.  She has mild tenderness on palpation of the anterior lateral ankle just beneath the anterior talofibular ligament.  She also has tenderness on palpation of the peroneal tendons just inferior to the lateral malleolus.  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  Also demonstrates some peroneal tendinitis.  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  Plan: This point with the ankle improving we will leave that alone in place a small amount of dexamethasone in it.  And only go after if starts become more symptomatic again.  However I do feel that an injection to her left peroneal tendon may benefit.  So at this point I injected her with IM 2 mg of dexamethasone and local anesthetic.  I like to follow-up with her if this does not improve.

## 2020-07-11 DIAGNOSIS — M5416 Radiculopathy, lumbar region: Secondary | ICD-10-CM | POA: Diagnosis not present

## 2020-07-11 DIAGNOSIS — M5136 Other intervertebral disc degeneration, lumbar region: Secondary | ICD-10-CM | POA: Diagnosis not present

## 2020-08-27 DIAGNOSIS — R1319 Other dysphagia: Secondary | ICD-10-CM | POA: Diagnosis not present

## 2020-08-28 ENCOUNTER — Ambulatory Visit: Payer: Medicare Other | Admitting: Podiatry

## 2020-08-29 DIAGNOSIS — Z20822 Contact with and (suspected) exposure to covid-19: Secondary | ICD-10-CM | POA: Diagnosis not present

## 2020-09-02 DIAGNOSIS — F32A Depression, unspecified: Secondary | ICD-10-CM | POA: Diagnosis not present

## 2020-09-02 DIAGNOSIS — Z79899 Other long term (current) drug therapy: Secondary | ICD-10-CM | POA: Diagnosis not present

## 2020-09-02 DIAGNOSIS — R1310 Dysphagia, unspecified: Secondary | ICD-10-CM | POA: Diagnosis not present

## 2020-09-02 DIAGNOSIS — I10 Essential (primary) hypertension: Secondary | ICD-10-CM | POA: Diagnosis not present

## 2020-09-02 DIAGNOSIS — F419 Anxiety disorder, unspecified: Secondary | ICD-10-CM | POA: Diagnosis not present

## 2020-09-02 DIAGNOSIS — K2100 Gastro-esophageal reflux disease with esophagitis, without bleeding: Secondary | ICD-10-CM | POA: Diagnosis not present

## 2020-09-02 DIAGNOSIS — K3189 Other diseases of stomach and duodenum: Secondary | ICD-10-CM | POA: Diagnosis not present

## 2020-09-02 DIAGNOSIS — K760 Fatty (change of) liver, not elsewhere classified: Secondary | ICD-10-CM | POA: Diagnosis not present

## 2020-09-02 DIAGNOSIS — F418 Other specified anxiety disorders: Secondary | ICD-10-CM | POA: Diagnosis not present

## 2020-09-02 DIAGNOSIS — G4730 Sleep apnea, unspecified: Secondary | ICD-10-CM | POA: Diagnosis not present

## 2020-09-02 DIAGNOSIS — K222 Esophageal obstruction: Secondary | ICD-10-CM | POA: Diagnosis not present

## 2020-09-02 DIAGNOSIS — Z20822 Contact with and (suspected) exposure to covid-19: Secondary | ICD-10-CM | POA: Diagnosis not present

## 2020-09-02 DIAGNOSIS — Z833 Family history of diabetes mellitus: Secondary | ICD-10-CM | POA: Diagnosis not present

## 2020-09-11 ENCOUNTER — Other Ambulatory Visit: Payer: Self-pay | Admitting: Family Medicine

## 2020-09-16 ENCOUNTER — Telehealth: Payer: Self-pay | Admitting: Family Medicine

## 2020-09-16 DIAGNOSIS — E7800 Pure hypercholesterolemia, unspecified: Secondary | ICD-10-CM

## 2020-09-16 DIAGNOSIS — E559 Vitamin D deficiency, unspecified: Secondary | ICD-10-CM

## 2020-09-16 DIAGNOSIS — R7309 Other abnormal glucose: Secondary | ICD-10-CM

## 2020-09-16 NOTE — Telephone Encounter (Signed)
-----   Message from Cloyd Stagers, RT sent at 09/11/2020  2:59 PM EST ----- Regarding: Lab Orders for Friday 12.17.2021 Please place lab orders for Friday 12.17.2021, office visit for physical on Tuesday 1.4.2022 Thank you, Dyke Maes RT(R)

## 2020-09-18 DIAGNOSIS — Z8719 Personal history of other diseases of the digestive system: Secondary | ICD-10-CM | POA: Diagnosis not present

## 2020-09-27 ENCOUNTER — Other Ambulatory Visit (INDEPENDENT_AMBULATORY_CARE_PROVIDER_SITE_OTHER): Payer: Medicare Other

## 2020-09-27 ENCOUNTER — Other Ambulatory Visit: Payer: Self-pay

## 2020-09-27 DIAGNOSIS — R7309 Other abnormal glucose: Secondary | ICD-10-CM | POA: Diagnosis not present

## 2020-09-27 DIAGNOSIS — E559 Vitamin D deficiency, unspecified: Secondary | ICD-10-CM | POA: Diagnosis not present

## 2020-09-27 DIAGNOSIS — E7800 Pure hypercholesterolemia, unspecified: Secondary | ICD-10-CM

## 2020-09-27 LAB — COMPREHENSIVE METABOLIC PANEL
ALT: 14 U/L (ref 0–35)
AST: 14 U/L (ref 0–37)
Albumin: 3.8 g/dL (ref 3.5–5.2)
Alkaline Phosphatase: 96 U/L (ref 39–117)
BUN: 20 mg/dL (ref 6–23)
CO2: 30 mEq/L (ref 19–32)
Calcium: 9.3 mg/dL (ref 8.4–10.5)
Chloride: 106 mEq/L (ref 96–112)
Creatinine, Ser: 0.99 mg/dL (ref 0.40–1.20)
GFR: 58.58 mL/min — ABNORMAL LOW (ref >60.00)
Glucose, Bld: 106 mg/dL — ABNORMAL HIGH (ref 70–99)
Potassium: 3.8 mEq/L (ref 3.5–5.1)
Sodium: 141 mEq/L (ref 135–145)
Total Bilirubin: 0.4 mg/dL (ref 0.2–1.2)
Total Protein: 6.8 g/dL (ref 6.0–8.3)

## 2020-09-27 LAB — LIPID PANEL
Cholesterol: 170 mg/dL (ref 0–200)
HDL: 34.5 mg/dL — ABNORMAL LOW (ref >39.00)
LDL Cholesterol: 109 mg/dL — ABNORMAL HIGH (ref 0–99)
NonHDL: 135.56
Total CHOL/HDL Ratio: 5
Triglycerides: 135 mg/dL (ref 0.0–149.0)
VLDL: 27 mg/dL (ref 0.0–40.0)

## 2020-09-27 LAB — HEMOGLOBIN A1C: Hgb A1c MFr Bld: 6 % (ref 4.6–6.5)

## 2020-09-27 LAB — VITAMIN D 25 HYDROXY (VIT D DEFICIENCY, FRACTURES): VITD: 33.09 ng/mL (ref 30.00–100.00)

## 2020-09-27 NOTE — Progress Notes (Signed)
No critical labs need to be addressed urgently. We will discuss labs in detail at upcoming office visit.   

## 2020-10-03 ENCOUNTER — Encounter: Payer: Medicare Other | Admitting: Family Medicine

## 2020-10-08 DIAGNOSIS — M5136 Other intervertebral disc degeneration, lumbar region: Secondary | ICD-10-CM | POA: Diagnosis not present

## 2020-10-08 DIAGNOSIS — M5416 Radiculopathy, lumbar region: Secondary | ICD-10-CM | POA: Diagnosis not present

## 2020-10-08 DIAGNOSIS — M48062 Spinal stenosis, lumbar region with neurogenic claudication: Secondary | ICD-10-CM | POA: Diagnosis not present

## 2020-10-09 ENCOUNTER — Other Ambulatory Visit: Payer: Self-pay | Admitting: Family Medicine

## 2020-10-09 NOTE — Telephone Encounter (Signed)
Last office visit 09/29/2019 for MWV.  Last refilled 03/26/2020 for #270 with 1 refill.  CPE schedule 10/15/2020.

## 2020-10-15 ENCOUNTER — Encounter: Payer: Medicare Other | Admitting: Family Medicine

## 2020-10-22 ENCOUNTER — Other Ambulatory Visit: Payer: Self-pay

## 2020-10-22 ENCOUNTER — Ambulatory Visit (INDEPENDENT_AMBULATORY_CARE_PROVIDER_SITE_OTHER): Payer: Medicare Other | Admitting: Family Medicine

## 2020-10-22 VITALS — BP 120/80 | HR 74 | Temp 98.3°F | Ht 64.0 in | Wt 162.2 lb

## 2020-10-22 DIAGNOSIS — E2839 Other primary ovarian failure: Secondary | ICD-10-CM | POA: Diagnosis not present

## 2020-10-22 DIAGNOSIS — R7309 Other abnormal glucose: Secondary | ICD-10-CM

## 2020-10-22 DIAGNOSIS — G4700 Insomnia, unspecified: Secondary | ICD-10-CM

## 2020-10-22 DIAGNOSIS — F331 Major depressive disorder, recurrent, moderate: Secondary | ICD-10-CM

## 2020-10-22 DIAGNOSIS — I10 Essential (primary) hypertension: Secondary | ICD-10-CM

## 2020-10-22 DIAGNOSIS — E7800 Pure hypercholesterolemia, unspecified: Secondary | ICD-10-CM | POA: Diagnosis not present

## 2020-10-22 DIAGNOSIS — K222 Esophageal obstruction: Secondary | ICD-10-CM | POA: Insufficient documentation

## 2020-10-22 DIAGNOSIS — M8580 Other specified disorders of bone density and structure, unspecified site: Secondary | ICD-10-CM | POA: Diagnosis not present

## 2020-10-22 DIAGNOSIS — Z0000 Encounter for general adult medical examination without abnormal findings: Secondary | ICD-10-CM | POA: Diagnosis not present

## 2020-10-22 DIAGNOSIS — Z23 Encounter for immunization: Secondary | ICD-10-CM

## 2020-10-22 MED ORDER — ERGOCALCIFEROL 1.25 MG (50000 UT) PO CAPS: 50000.0000 [IU] | ORAL_CAPSULE | ORAL | 0 refills | Status: DC

## 2020-10-22 MED ORDER — TRAZODONE HCL 50 MG PO TABS: 25.0000 mg | ORAL_TABLET | Freq: Every evening | ORAL | 3 refills | Status: DC | PRN

## 2020-10-22 NOTE — Progress Notes (Signed)
Patient ID: Beth Fox, female    DOB: [birth year], 70 y.o.   MRN: [id number]  This visit was conducted in person.  BP 120/80   Pulse 74   Temp 98.3 F (36.8 C) (Temporal)   Ht 5\' 4"  (1.626 m)   Wt 162 lb 4 oz (73.6 kg)   SpO2 93%   BMI 27.85 kg/m    CC: medicare wellness Subjective:   HPI: Beth Fox is a 70 y.o. female presenting on 10/22/2020 for Medicare Wellness   The patient presents for annual medicare wellness, complete physical and review of chronic health problems. He/She also has the following acute concerns today: none  I have personally reviewed the Medicare Annual Wellness questionnaire and have noted 1. The patient's medical and social history 2. Their use of alcohol, tobacco or illicit drugs 3. Their current medications and supplements 4. The patient's functional ability including ADL's, fall risks, home safety risks and hearing or visual             impairment. 5. Diet and physical activities 6. Evidence for depression or mood disorders 7.         Updated provider list Cognitive evaluation was performed and recorded on pt medicare questionnaire form. The patients weight, height, BMI and visual acuity have been recorded in the chart  I have made referrals, counseling and provided education to the patient based review of the above and I have provided the pt with a written personalized care plan for preventive services.   Documentation of this information was scanned into the electronic record under the media tab.   Advance directives and end of life planning reviewed in detail with patient and documented in EMR. Patient given handout on advance care directives if needed. HCPOA and living will updated if needed.   Hearing Screening   Method: Audiometry   125Hz  250Hz  500Hz  1000Hz  2000Hz  3000Hz  4000Hz  6000Hz  8000Hz   Right ear:   20 20 20  20     Left ear:   20 20 20  20     Comments: Wears Glasses-Eye exam at [facility] 06/2020     Fall  Risk  10/22/2020 09/29/2019 09/05/2019 09/15/2018 08/12/2017  Falls in the past year? 0 0 0 0 No  Comment - - Emmi Telephone Survey: data to providers prior to load - -   Elevated Cholesterol:  Lab Results  Component Value Date   CHOL 170 09/27/2020   HDL 34.50 (L) 09/27/2020   LDLCALC 109 (H) 09/27/2020   LDLDIRECT 167.7 09/01/2013   TRIG 135.0 09/27/2020   CHOLHDL 5 09/27/2020  Using medications without problems: Muscle aches:  Diet compliance: improve.. less junk food Exercise: none Other complaints:  MDD: good control on sertraline 50 mg daily PHQ: 4.. only sleep issues.Marland Kitchen advil PM helps some  Prediabetes:  stable Lab Results  Component Value Date   HGBA1C 6.0 09/27/2020    Vit D ... not on supplement  Hypertension:  Well controlled. BP Readings from Last 3 Encounters:  10/22/20 120/80  01/31/20 (!) 142/88  09/29/19 104/70  Using medication without problems or lightheadedness:  none Chest pain with exertion: none Edema:none Short of breath: none Average home BPs: Other issues:     Current ongoing respiratory infection x > 1 week of symptoms.. complete prednisone and completing antibiotics.. has more days. Improved symptoms overall.  Relevant past medical, surgical, family and social history reviewed and updated as indicated. Interim medical history since our last visit reviewed. Allergies and  medications reviewed and updated. Outpatient Medications Prior to Visit  Medication Sig Dispense Refill  . albuterol (VENTOLIN HFA) 108 (90 Base) MCG/ACT inhaler Inhale into the lungs.    Marland Kitchen amoxicillin-clavulanate (AUGMENTIN) 875-125 MG tablet Take 1 tablet by mouth 2 (two) times daily.    . benzonatate (TESSALON) 100 MG capsule Take 100 mg by mouth 3 (three) times daily.    Marland Kitchen gabapentin (NEURONTIN) 100 MG capsule TAKE 1 CAPSULE BY MOUTH 3 TIMES DAILY. START AT 100 MG AT BEDTIME & GRADUALLY INCREASE TO 3XDAY 270 capsule 1  . hydrochlorothiazide (HYDRODIURIL) 25 MG tablet TAKE  1 TABLET BY MOUTH EVERY DAY 90 tablet 1  . omeprazole (PRILOSEC) 20 MG capsule Take 1 capsule (20 mg total) by mouth 2 (two) times daily before a meal. 180 capsule 3  . sertraline (ZOLOFT) 50 MG tablet TAKE 1 TABLET BY MOUTH EVERY DAY 90 tablet 0   No facility-administered medications prior to visit.     Per HPI unless specifically indicated in ROS section below Review of Systems  Constitutional: Negative for fatigue and fever.  HENT: Negative for congestion.   Eyes: Negative for pain.  Respiratory: Negative for cough and shortness of breath.   Cardiovascular: Negative for chest pain, palpitations and leg swelling.  Gastrointestinal: Negative for abdominal pain.  Genitourinary: Negative for dysuria and vaginal bleeding.  Musculoskeletal: Negative for back pain.  Neurological: Negative for syncope, light-headedness and headaches.  Psychiatric/Behavioral: Negative for dysphoric mood.   Objective:  BP 120/80   Pulse 74   Temp 98.3 F (36.8 C) (Temporal)   Ht 5\' 4"  (1.626 m)   Wt 162 lb 4 oz (73.6 kg)   SpO2 93%   BMI 27.85 kg/m   Wt Readings from Last 3 Encounters:  10/22/20 162 lb 4 oz (73.6 kg)  01/31/20 167 lb (75.8 kg)  09/29/19 155 lb 4 oz (70.4 kg)      Physical Exam Constitutional:      General: She is not in acute distress.Vital signs are normal.     Appearance: Normal appearance. She is well-developed and well-nourished. She is not ill-appearing or toxic-appearing.  HENT:     Head: Normocephalic.     Right Ear: Hearing, tympanic membrane, ear canal and external ear normal.     Left Ear: Hearing, tympanic membrane, ear canal and external ear normal.     Nose: Nose normal.  Eyes:     General: Lids are normal. Lids are everted, no foreign bodies appreciated.     Extraocular Movements: EOM normal.     Conjunctiva/sclera: Conjunctivae normal.     Pupils: Pupils are equal, round, and reactive to light.  Neck:     Thyroid: No thyroid mass or thyromegaly.      Vascular: No carotid bruit.     Trachea: Trachea normal.  Cardiovascular:     Rate and Rhythm: Normal rate and regular rhythm.     Pulses: Intact distal pulses.     Heart sounds: Normal heart sounds, S1 normal and S2 normal. No murmur heard. No gallop.   Pulmonary:     Effort: Pulmonary effort is normal. No respiratory distress.     Breath sounds: Normal breath sounds. No wheezing, rhonchi or rales.  Abdominal:     General: Bowel sounds are normal. There is no distension or abdominal bruit.     Palpations: Abdomen is soft. There is no fluid wave, hepatosplenomegaly or mass.     Tenderness: There is no abdominal tenderness. There is no  CVA tenderness, guarding or rebound.     Hernia: No hernia is present.  Musculoskeletal:     Cervical back: Normal range of motion and neck supple.  Lymphadenopathy:     Cervical: No cervical adenopathy.     Upper Body:  No axillary adenopathy present. Skin:    General: Skin is warm, dry and intact.     Findings: No rash.  Neurological:     Mental Status: She is alert.     Cranial Nerves: No cranial nerve deficit.     Sensory: No sensory deficit.     Deep Tendon Reflexes: Strength normal.  Psychiatric:        Mood and Affect: Mood is not anxious or depressed.        Speech: Speech normal.        Behavior: Behavior normal. Behavior is cooperative.        Cognition and Memory: Cognition and memory normal.        Judgment: Judgment normal.       Results for orders placed or performed in visit on 09/27/20  VITAMIN D 25 Hydroxy (Vit-D Deficiency, Fractures)  Result Value Ref Range   VITD 33.09 30.00 - 100.00 ng/mL  Comprehensive metabolic panel  Result Value Ref Range   Sodium 141 135 - 145 mEq/L   Potassium 3.8 3.5 - 5.1 mEq/L   Chloride 106 96 - 112 mEq/L   CO2 30 19 - 32 mEq/L   Glucose, Bld 106 (H) 70 - 99 mg/dL   BUN 20 6 - 23 mg/dL   Creatinine, Ser 0.99 0.40 - 1.20 mg/dL   Total Bilirubin 0.4 0.2 - 1.2 mg/dL   Alkaline Phosphatase  96 39 - 117 U/L   AST 14 0 - 37 U/L   ALT 14 0 - 35 U/L   Total Protein 6.8 6.0 - 8.3 g/dL   Albumin 3.8 3.5 - 5.2 g/dL   GFR 58.58 (L) >60.00 mL/min   Calcium 9.3 8.4 - 10.5 mg/dL  Lipid panel  Result Value Ref Range   Cholesterol 170 0 - 200 mg/dL   Triglycerides 135.0 0.0 - 149.0 mg/dL   HDL 34.50 (L) >39.00 mg/dL   VLDL 27.0 0.0 - 40.0 mg/dL   LDL Cholesterol 109 (H) 0 - 99 mg/dL   Total CHOL/HDL Ratio 5    NonHDL 135.56   Hemoglobin A1c  Result Value Ref Range   Hgb A1c MFr Bld 6.0 4.6 - 6.5 %    This visit occurred during the SARS-CoV-2 public health emergency.  Safety protocols were in place, including screening questions prior to the visit, additional usage of staff PPE, and extensive cleaning of exam room while observing appropriate contact time as indicated for disinfecting solutions.   COVID 19 screen:  No recent travel or known exposure to COVID19 The patient denies respiratory symptoms of COVID 19 at this time. The importance of social distancing was discussed today.   Assessment and Plan The patient's preventative maintenance and recommended screening tests for an annual wellness exam were reviewed in full today. Brought up to date unless services declined.  Counselled on the importance of diet, exercise, and its role in overall health and mortality. The patient's FH and SH was reviewed, including their home life, tobacco status, and drug and alcohol status.   Vaccines:  Consider shingels. Uptodate with tdap, flu, pna 23, 13 Colon:Not sure when last colonoscopy done, likely over 10 years but was nml. She has opted forcologuard.. Negative in 2018.. repeat  due NOW DEXA: No family history of osteoporosis, got menses average, menopause age 44, no steroid use. She is moderate risk last done 2016, osteopenia repeat in 5 years. Mammo:stable12/2019, personal history of breast cancer.. Needs yearly CBE ( pt opted against this 2020) Will schedule.    Pap/DVE: nml  pap, neg HPVin 12/2012, no history of abnormal paps no further indicatedAsymptomatic and low risk for ovarian or uterine cancer. Nonsmoker. HEP C:done  Problem List Items Addressed This Visit    Benign essential hypertension (Chronic)    Stable, chronic.  Continue current medication.   HCTZ 25 mg daily.       HYPERCHOLESTEROLEMIA (Chronic)    Diet controlled but room for improvement.. Encouraged exercise, weight loss, healthy eating habits.  The 10-year ASCVD risk score Mikey Bussing DC Brooke Bonito., et al., 2013) is: 9.7%   Values used to calculate the score:     Age: 11 years     Sex: Female     Is Non-Hispanic African American: No     Diabetic: No     Tobacco smoker: No     Systolic Blood Pressure: 962 mmHg     Is BP treated: Yes     HDL Cholesterol: 34.5 mg/dL     Total Cholesterol: 170 mg/dL       INSOMNIA, CHRONIC (Chronic)     Will try a trial on trazodone for sleep at night.      MDD (major depressive disorder), recurrent episode, moderate (HCC) (Chronic)    good control on sertraline 50 mg daily      Osteopenia   PREDIABETES (Chronic)    Stable control, work on low carb diet.       Other Visit Diagnoses    Medicare annual wellness visit, subsequent    -  Primary   Estrogen deficiency       Relevant Orders   DG Bone Density   Need for influenza vaccination       Relevant Orders   Flu Vaccine QUAD High Dose(Fluad) (Completed)     Meds ordered this encounter  Medications  . DISCONTD: traZODone (DESYREL) 50 MG tablet    Sig: Take 0.5-1 tablets (25-50 mg total) by mouth at bedtime as needed for sleep.    Dispense:  30 tablet    Refill:  3  . ergocalciferol (VITAMIN D2) 1.25 MG (50000 UT) capsule    Sig: Take 1 capsule (50,000 Units total) by mouth once a week.    Dispense:  12 capsule    Refill:  0   Orders Placed This Encounter  Procedures  . DG Bone Density    Standing Status:   Future    Standing Expiration Date:   10/22/2021    Order Specific  Question:   Reason for Exam (SYMPTOM  OR DIAGNOSIS REQUIRED)    Answer:   osteopenia    Order Specific Question:   Preferred imaging location?    Answer:   [facility]  . Flu Vaccine QUAD High Dose(Fluad)        Eliezer Lofts, MD

## 2020-10-22 NOTE — Patient Instructions (Addendum)
Keep working on low cholesterol heart healthy diet.  Get back to regular exercise.  Please call the location of your choice from the menu below to schedule your Mammogram and/or Bone Density appointment.    [place]   1. [facility]                      Phone:  [phone] [address], [place] [address]                                                             Services: Traditional and 3D Mammogram, Bone Density   2. [place] [facility]                 Phone: [phone] [address], [place] [address]    Service: Bone Density ONLY   *this site does NOT perform mammograms  3. [place]                        Phone:  [phone] [address], [place] [address]                                            Services:  3D Mammogram and Bone Density    [place]  1. [place] [facility]   Phone:  [phone]   [place], [place] [address]                                            Services: 3D Mammogram and Bone Density  2. [place] at [facility])  Phone:  [phone]   [address]  [place], [place] [address]                                              Services:  3D Mammogram and Bone Density  

## 2020-10-22 NOTE — Assessment & Plan Note (Signed)
Will try a trial on trazodone for sleep at night.

## 2020-10-31 DIAGNOSIS — Z1211 Encounter for screening for malignant neoplasm of colon: Secondary | ICD-10-CM | POA: Diagnosis not present

## 2020-10-31 DIAGNOSIS — Z1212 Encounter for screening for malignant neoplasm of rectum: Secondary | ICD-10-CM | POA: Diagnosis not present

## 2020-10-31 LAB — COLOGUARD: Cologuard: NEGATIVE

## 2020-11-08 ENCOUNTER — Other Ambulatory Visit: Payer: Self-pay | Admitting: Family Medicine

## 2020-11-08 LAB — COLOGUARD
COLOGUARD: NEGATIVE
COLOGUARD: NEGATIVE

## 2020-11-11 ENCOUNTER — Encounter: Payer: Self-pay | Admitting: Family Medicine

## 2020-11-11 DIAGNOSIS — K219 Gastro-esophageal reflux disease without esophagitis: Secondary | ICD-10-CM | POA: Diagnosis not present

## 2020-11-11 DIAGNOSIS — R1319 Other dysphagia: Secondary | ICD-10-CM | POA: Diagnosis not present

## 2020-11-13 ENCOUNTER — Other Ambulatory Visit: Payer: Self-pay | Admitting: Family Medicine

## 2020-11-23 IMAGING — MR MR ANKLE*L* W/O CM
5 series · 40 of 40 positions shown · non-contrast
Comparison: 05/17/2018

CLINICAL DATA: Foot pain, ankle pain along the lateral aspect.
Radiates up the leg. Symptoms for 4 months.

EXAM:
MRI OF THE LEFT ANKLE WITHOUT CONTRAST
TECHNIQUE: Multiplanar, multisequence MR imaging of the ankle was performed. No
intravenous contrast was administered.

[Series 3: PD fat-sat · axial · left · 3.0mm · 0.50mm/px · z∈[-89,+50]mm · 10 of 36 slices shown]
[im 1/36]
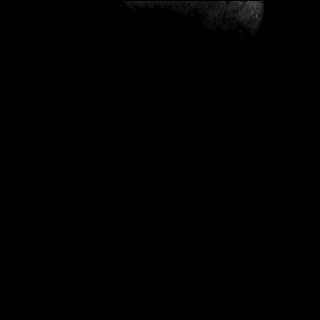
[im 4/36]
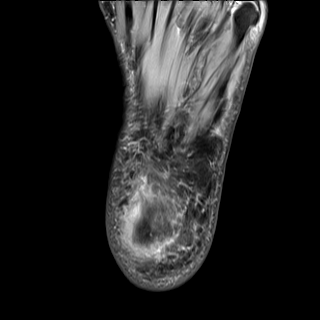
[im 8/36]
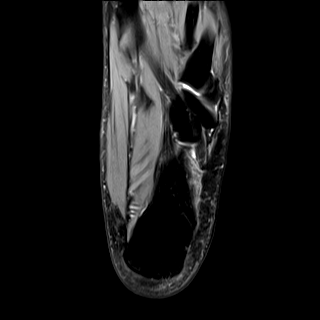
[im 12/36]
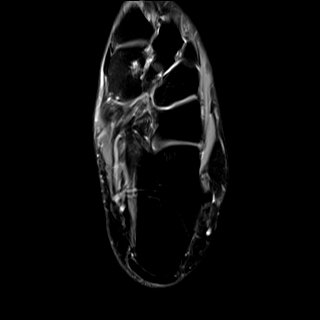
[im 16/36]
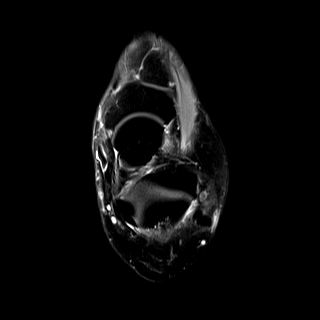
[im 20/36]
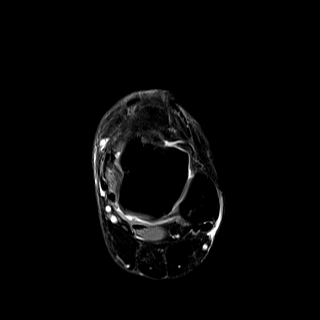
[im 24/36]
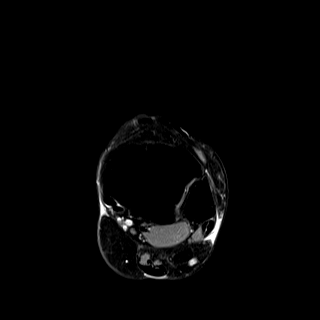
[im 28/36]
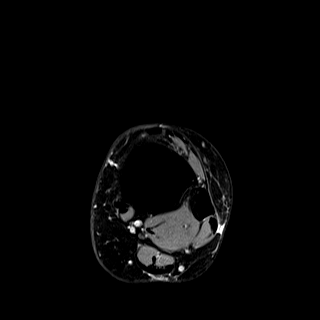
[im 32/36]
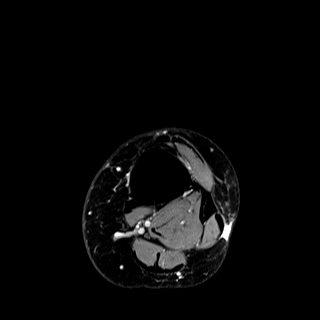
[im 36/36]
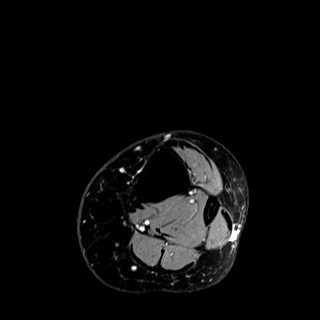

[Series 4: T2 fat-sat · axial · left · 3.0mm · 0.50mm/px · z∈[-89,+50]mm · 10 of 36 slices shown (1 of 2)]
[im 1/36]
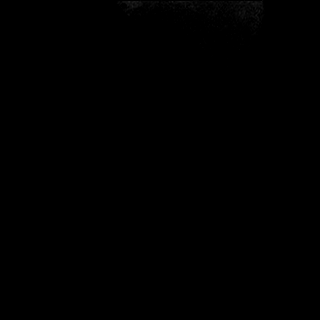
[im 4/36]
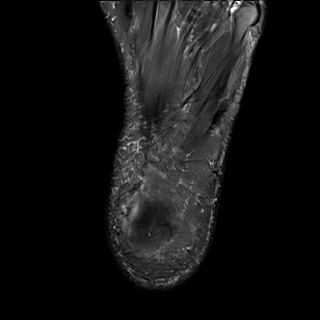
[im 8/36]
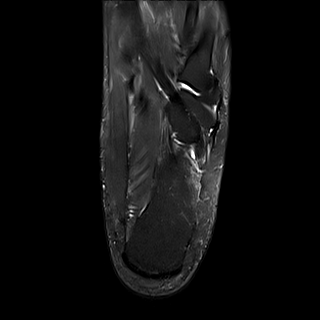
[im 12/36]
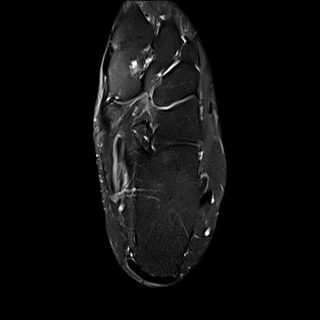
[im 16/36]
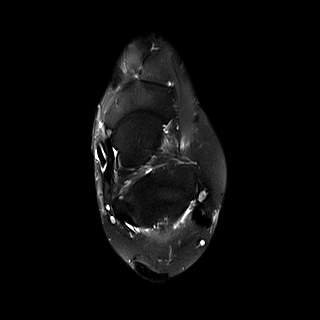
[im 20/36]
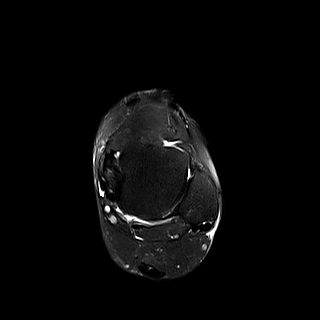
[im 24/36]
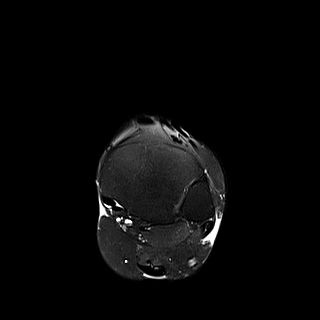
[im 28/36]
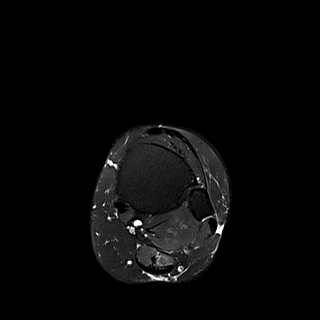
[im 32/36]
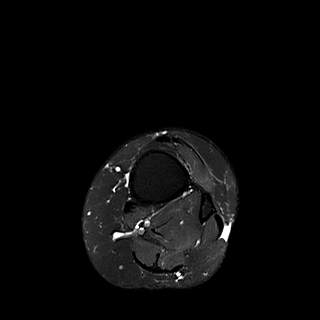
[im 36/36]
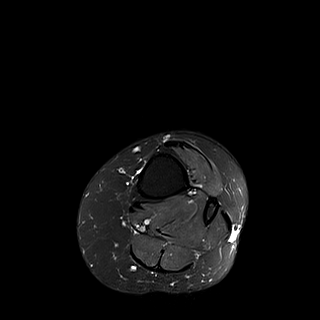

[Series 5: T2 fat-sat · coronal · left · 3.0mm · 0.62mm/px · 10 of 40 slices shown (2 of 2)]
[im 1/40]
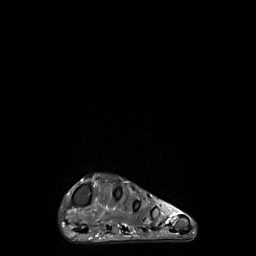
[im 5/40]
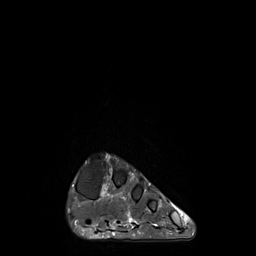
[im 9/40]
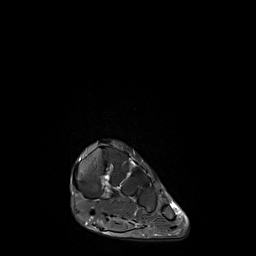
[im 14/40]
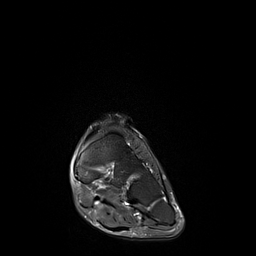
[im 18/40]
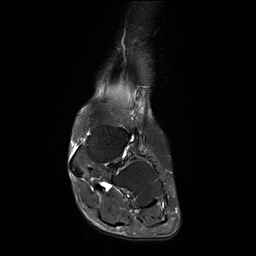
[im 22/40]
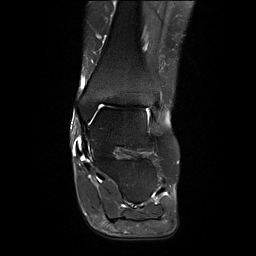
[im 27/40]
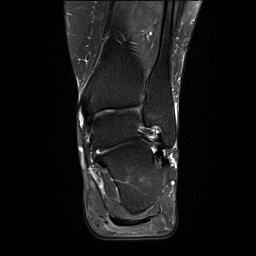
[im 31/40]
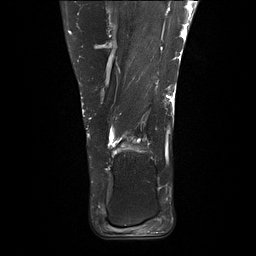
[im 35/40]
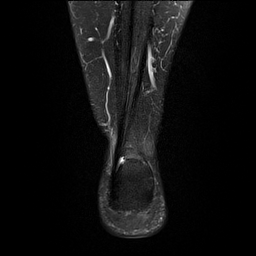
[im 40/40]
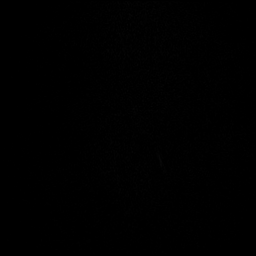

[Series 6: T1 · sagittal · left · 4.0mm · 0.70mm/px · 5 of 21 slices shown]
[im 1/21]
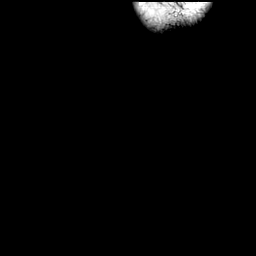
[im 6/21]
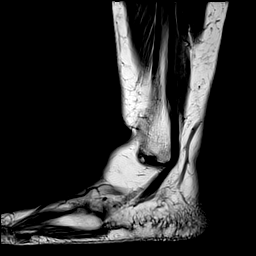
[im 11/21]
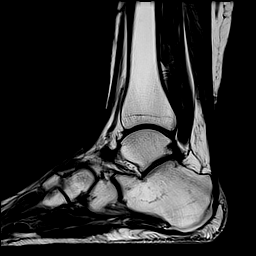
[im 16/21]
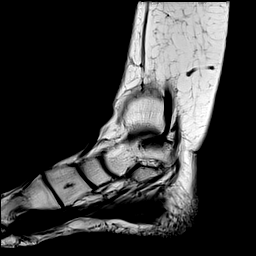
[im 21/21]
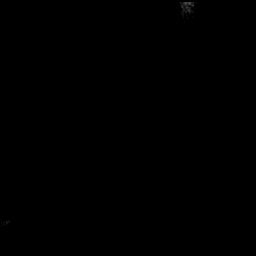

[Series 7: STIR · sagittal · left · 4.0mm · 0.35mm/px · 5 of 21 slices shown]
[im 1/21]
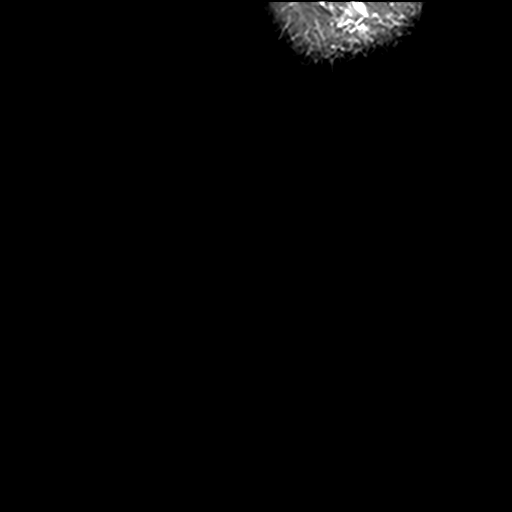
[im 6/21]
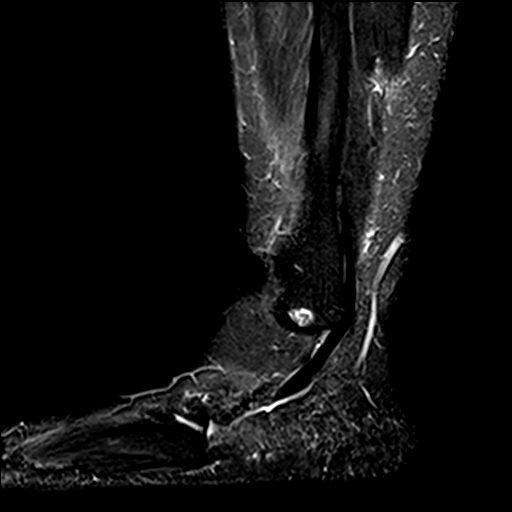
[im 11/21]
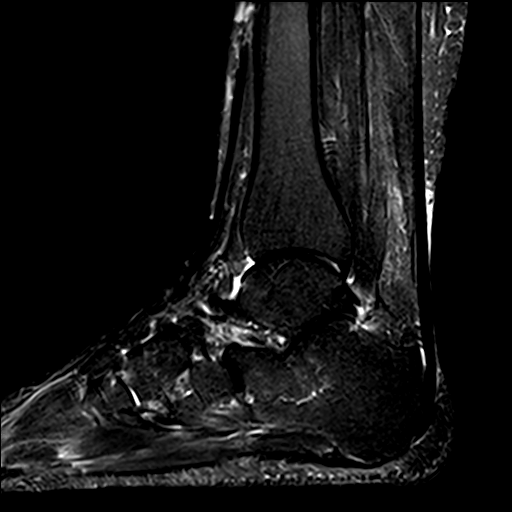
[im 16/21]
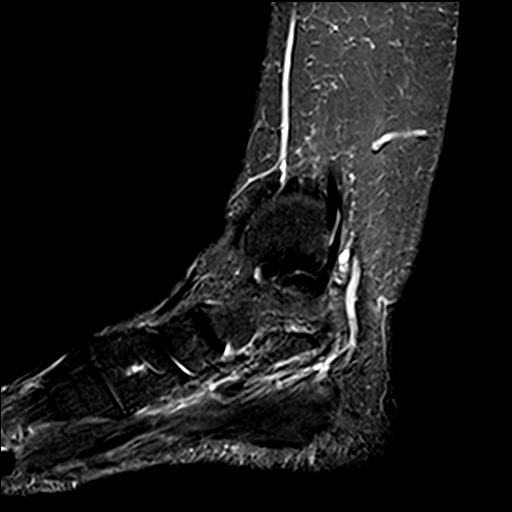
[im 21/21]
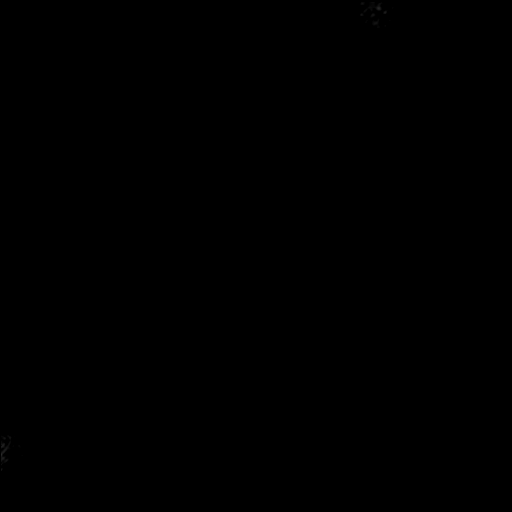

[40 of 40 positions shown; findings below may reference images not displayed]

FINDINGS: TENDONS

Peroneal: Peroneal longus tendon intact. Peroneal brevis intact.

Posteromedial: Posterior tibial tendon intact. Flexor hallucis
longus tendon intact. Flexor digitorum longus tendon intact.

Anterior: Tibialis anterior tendon intact. Extensor hallucis longus
tendon intact Extensor digitorum longus tendon intact.

Achilles:  Intact.

Plantar Fascia: Mild thickening of the medial band of the plantar
fascia as can be seen with mild plantar fasciitis without a tear.

LIGAMENTS

Lateral: Anterior talofibular ligament intact. Calcaneofibular
ligament intact. Posterior talofibular ligament intact. Anterior and
posterior tibiofibular ligaments intact.

Medial: Deltoid ligament intact. Spring ligament intact.

CARTILAGE

Ankle Joint: No joint effusion. Normal ankle mortise. No chondral
defect.

Subtalar Joints/Sinus Tarsi: Normal subtalar joints. No subtalar
joint effusion. Normal sinus tarsi.

Bones: No acute fracture or dislocation. Reactive cystic changes in
the lateral aspect of the medial cuneiform at the articulation with
the middle cuneiform. Small plantar calcaneal spur. Cystic area
within the tip versus adjacent to the tip of the fibula likely
reflecting a ganglion without surrounding marrow edema and unchanged
compared with 05/17/2018.

Soft Tissue: No fluid collection or hematoma. Muscles are normal.
IMPRESSION: 1. Mild thickening of the medial band of the plantar fascia as can
be seen with mild plantar fasciitis without a tear.

## 2020-11-26 DIAGNOSIS — M5416 Radiculopathy, lumbar region: Secondary | ICD-10-CM | POA: Diagnosis not present

## 2020-11-26 DIAGNOSIS — M48062 Spinal stenosis, lumbar region with neurogenic claudication: Secondary | ICD-10-CM | POA: Diagnosis not present

## 2020-11-26 DIAGNOSIS — M5136 Other intervertebral disc degeneration, lumbar region: Secondary | ICD-10-CM | POA: Diagnosis not present

## 2020-12-02 DIAGNOSIS — K449 Diaphragmatic hernia without obstruction or gangrene: Secondary | ICD-10-CM | POA: Diagnosis not present

## 2020-12-09 NOTE — Assessment & Plan Note (Signed)
Stable control, work on low carb diet. 

## 2020-12-09 NOTE — Assessment & Plan Note (Signed)
good control on sertraline 50 mg daily

## 2020-12-09 NOTE — Assessment & Plan Note (Signed)
Diet controlled but room for improvement.. Encouraged exercise, weight loss, healthy eating habits.  The 10-year ASCVD risk score Mikey Bussing DC Brooke Bonito., et al., 2013) is: 9.7%   Values used to calculate the score:     Age: 70 years     Sex: Female     Is Non-Hispanic African American: No     Diabetic: No     Tobacco smoker: No     Systolic Blood Pressure: 951 mmHg     Is BP treated: Yes     HDL Cholesterol: 34.5 mg/dL     Total Cholesterol: 170 mg/dL

## 2020-12-09 NOTE — Assessment & Plan Note (Signed)
Stable, chronic.  Continue current medication.  HCTZ 25 mg daily 

## 2020-12-12 ENCOUNTER — Other Ambulatory Visit: Payer: Self-pay

## 2020-12-12 ENCOUNTER — Ambulatory Visit (INDEPENDENT_AMBULATORY_CARE_PROVIDER_SITE_OTHER): Payer: Medicare Other | Admitting: Podiatry

## 2020-12-12 ENCOUNTER — Encounter: Payer: Self-pay | Admitting: Podiatry

## 2020-12-12 DIAGNOSIS — M7752 Other enthesopathy of left foot: Secondary | ICD-10-CM | POA: Diagnosis not present

## 2020-12-12 MED ORDER — TRIAMCINOLONE ACETONIDE 40 MG/ML IJ SUSP
20.0000 mg | Freq: Once | INTRAMUSCULAR | Status: AC
  Administered 2020-12-12: 20 mg

## 2020-12-12 NOTE — Progress Notes (Signed)
She presents today for a follow-up of her peroneal tendinitis she states that I was doing good for quite some time as she refers to September but is starting to become more painful again over the past couple of weeks right here she points to the proximal peroneal tendons posterior to the lateral malleolus.  Objective: Vital signs are stable she alert oriented x3 she has no erythema edema cellulitis drainage or odor she still has the majority of her tenderness along the posterior superior aspect of the peroneal tendons at the my tendinous junctions.  I where we had treated last time just inferior to the lateral malleolus is no longer demonstrating any pain.  There is no fluctuance on palpation of these peroneal tendons of the behind the bone today.  Assessment: Peroneal tendinitis.  Plan: I injected 10 mg of Kenalog to the point of maximal tenderness with local anesthetic.  She tolerated procedure well I will follow-up with her on an as-needed basis.

## 2020-12-14 ENCOUNTER — Other Ambulatory Visit: Payer: Self-pay | Admitting: Family Medicine

## 2020-12-20 ENCOUNTER — Telehealth: Payer: Self-pay

## 2020-12-20 NOTE — Telephone Encounter (Signed)
Pt left v/m that [facility] advised pt that the trazodone rx had been cancelled. I spoke with Fatima Sanger at [facility] and the 11/13/20 refill left by Dr Lorelei Pont is on hold; Fatima Sanger ran rx and it went thru pt can pick up trazodone any time today after 3 PM. Pt notified and voiced understanding and will pick up med later today.nothing further needed.

## 2021-01-06 ENCOUNTER — Other Ambulatory Visit: Payer: Self-pay | Admitting: Family Medicine

## 2021-01-06 NOTE — Telephone Encounter (Signed)
Last office visit 10/22/2020 for [place].  Last refilled 10/22/2020 for #12 with no refills.  Last Vit D level done 09/27/2020 which was 33.09 ng/ml.  Refill?

## 2021-02-04 ENCOUNTER — Other Ambulatory Visit: Payer: Self-pay | Admitting: Family Medicine

## 2021-02-04 DIAGNOSIS — Z1231 Encounter for screening mammogram for malignant neoplasm of breast: Secondary | ICD-10-CM

## 2021-02-19 ENCOUNTER — Other Ambulatory Visit: Payer: Self-pay

## 2021-02-19 ENCOUNTER — Ambulatory Visit
Admission: RE | Admit: 2021-02-19 | Discharge: 2021-02-19 | Disposition: A | Discharge status: Home / Self Care | Payer: Medicare Other | Source: Ambulatory Visit | Attending: Family Medicine | Admitting: Family Medicine

## 2021-02-19 DIAGNOSIS — Z1231 Encounter for screening mammogram for malignant neoplasm of breast: Secondary | ICD-10-CM

## 2021-03-16 ENCOUNTER — Other Ambulatory Visit: Payer: Self-pay | Admitting: Family Medicine

## 2021-04-03 ENCOUNTER — Other Ambulatory Visit: Payer: Self-pay | Admitting: Family Medicine

## 2021-04-04 NOTE — Telephone Encounter (Signed)
Last office visit 10/22/20 for [place].  Last refilled 10/09/2020 for #270 with 1 refill.  No future appointments.

## 2021-05-29 DIAGNOSIS — M5126 Other intervertebral disc displacement, lumbar region: Secondary | ICD-10-CM | POA: Diagnosis not present

## 2021-05-29 DIAGNOSIS — M5416 Radiculopathy, lumbar region: Secondary | ICD-10-CM | POA: Diagnosis not present

## 2021-05-29 DIAGNOSIS — M48062 Spinal stenosis, lumbar region with neurogenic claudication: Secondary | ICD-10-CM | POA: Diagnosis not present

## 2021-06-24 ENCOUNTER — Other Ambulatory Visit: Payer: Self-pay | Admitting: Family Medicine

## 2021-08-29 ENCOUNTER — Other Ambulatory Visit: Payer: Self-pay | Admitting: Family Medicine

## 2021-08-29 NOTE — Telephone Encounter (Signed)
Last office visit 10/22/2020 for [place].  Last refilled 04/04/2021 for #270 with 1 refill.  No future appointments with PCP.

## 2021-11-04 ENCOUNTER — Other Ambulatory Visit: Payer: Self-pay | Admitting: Family Medicine

## 2021-11-04 NOTE — Telephone Encounter (Signed)
Called Ms. Cawthorn and [email or web address] to schedule CPE

## 2021-11-04 NOTE — Telephone Encounter (Signed)
Please schedule Medicare Wellness with nurse and CPE with fasting labs prior with Dr. Bedsole.   

## 2021-11-14 ENCOUNTER — Other Ambulatory Visit: Payer: Self-pay

## 2021-11-14 ENCOUNTER — Ambulatory Visit (INDEPENDENT_AMBULATORY_CARE_PROVIDER_SITE_OTHER): Payer: Medicare Other | Admitting: Family Medicine

## 2021-11-14 VITALS — BP 120/80 | HR 77 | Temp 97.5°F | Ht 64.0 in | Wt 166.5 lb

## 2021-11-14 DIAGNOSIS — E7800 Pure hypercholesterolemia, unspecified: Secondary | ICD-10-CM | POA: Diagnosis not present

## 2021-11-14 DIAGNOSIS — E559 Vitamin D deficiency, unspecified: Secondary | ICD-10-CM

## 2021-11-14 DIAGNOSIS — R7309 Other abnormal glucose: Secondary | ICD-10-CM | POA: Diagnosis not present

## 2021-11-14 DIAGNOSIS — Z0000 Encounter for general adult medical examination without abnormal findings: Secondary | ICD-10-CM

## 2021-11-14 DIAGNOSIS — F331 Major depressive disorder, recurrent, moderate: Secondary | ICD-10-CM | POA: Diagnosis not present

## 2021-11-14 DIAGNOSIS — G4700 Insomnia, unspecified: Secondary | ICD-10-CM

## 2021-11-14 DIAGNOSIS — I10 Essential (primary) hypertension: Secondary | ICD-10-CM

## 2021-11-14 DIAGNOSIS — Z23 Encounter for immunization: Secondary | ICD-10-CM

## 2021-11-14 DIAGNOSIS — E2839 Other primary ovarian failure: Secondary | ICD-10-CM

## 2021-11-14 LAB — LIPID PANEL
Cholesterol: 214 mg/dL — ABNORMAL HIGH (ref 0–200)
HDL: 50 mg/dL (ref >39.00)
LDL Cholesterol: 142 mg/dL — ABNORMAL HIGH (ref 0–99)
NonHDL: 163.73
Total CHOL/HDL Ratio: 4
Triglycerides: 107 mg/dL (ref 0.0–149.0)
VLDL: 21.4 mg/dL (ref 0.0–40.0)

## 2021-11-14 LAB — VITAMIN D 25 HYDROXY (VIT D DEFICIENCY, FRACTURES): VITD: 33.85 ng/mL (ref 30.00–100.00)

## 2021-11-14 LAB — COMPREHENSIVE METABOLIC PANEL
ALT: 18 U/L (ref 0–35)
AST: 16 U/L (ref 0–37)
Albumin: 4.3 g/dL (ref 3.5–5.2)
Alkaline Phosphatase: 93 U/L (ref 39–117)
BUN: 25 mg/dL — ABNORMAL HIGH (ref 6–23)
CO2: 35 mEq/L — ABNORMAL HIGH (ref 19–32)
Calcium: 10 mg/dL (ref 8.4–10.5)
Chloride: 101 mEq/L (ref 96–112)
Creatinine, Ser: 1.05 mg/dL (ref 0.40–1.20)
GFR: 54.16 mL/min — ABNORMAL LOW (ref >60.00)
Glucose, Bld: 101 mg/dL — ABNORMAL HIGH (ref 70–99)
Potassium: 3.6 mEq/L (ref 3.5–5.1)
Sodium: 141 mEq/L (ref 135–145)
Total Bilirubin: 0.6 mg/dL (ref 0.2–1.2)
Total Protein: 7.1 g/dL (ref 6.0–8.3)

## 2021-11-14 LAB — HEMOGLOBIN A1C: Hgb A1c MFr Bld: 6.2 % (ref 4.6–6.5)

## 2021-11-14 MED ORDER — TRAZODONE HCL 100 MG PO TABS: 100.0000 mg | ORAL_TABLET | Freq: Every day | ORAL | 0 refills | Status: DC

## 2021-11-14 NOTE — Patient Instructions (Addendum)
Please stop at the lab to have labs drawn.  Trial of trazodone at 100 mg nightly for continue insomnia.  Stop the naps. Get back to daily exercise.  Please call the location of your choice from the menu below to schedule your Mammogram and/or Bone Density appointment.    [facility]                      Phone:  [phone], [place] [address]                                                             Services: Traditional and 3D Mammogram, [place] [facility]                 Phone: [phone] [address], [place] [address]    Service: Bone Density ONLY   *this site does NOT perform mammograms  [place]                        Phone:  [phone] [address], [place] [address]                                            Services:  3D Mammogram and [place] [facility]   Phone:  [phone]   [place], [place] [address]                                            Services: 3D Mammogram and [place]  [place] at [facility])  Phone:  [phone]   [address]  [place], [place] [address]                                              Services:  3D Mammogram and Bone Density

## 2021-11-14 NOTE — Progress Notes (Signed)
Patient ID: Beth Fox, female    DOB: [birth year], 71 y.o.   MRN: [id number]  This visit was conducted in person.  BP 120/80    Pulse 77    Temp (!) 97.5 F (36.4 C) (Temporal)    Ht 5\' 4"  (1.626 m)    Wt 166 lb 8 oz (75.5 kg)    SpO2 94%    BMI 28.58 kg/m    CC:  Chief Complaint  Patient presents with   Medicare Wellness   Medicare Wellness Visit with nurse, followed by  65 minute CPE with Dr. Diona Browner   HPI: Beth Fox is a 71 y.o. female presenting on 11/14/2021 for Medicare Wellness  The patient presents for annual medicare wellness, complete physical and review of chronic health problems. He/She also has the following acute concerns today: none  I have personally reviewed the Medicare Annual Wellness questionnaire and have noted 1. The patient's medical and social history 2. Their use of alcohol, tobacco or illicit drugs 3. Their current medications and supplements 4. The patient's functional ability including ADL's, fall risks, home safety risks and hearing or visual             impairment. 5. Diet and physical activities 6. Evidence for depression or mood disorders 7.         Updated provider list. Cognitive evaluation was performed and recorded on pt medicare questionnaire form. The patients weight, height, BMI and visual acuity have been recorded in the chart   I have made referrals, counseling and provided education to the patient based review of the above and I have provided the pt with a written personalized care plan for preventive services.    Documentation of this information was scanned into the electronic record under the media tab.   Advance directives and end of life planning reviewed in detail with patient and documented in EMR. Patient given handout on advance care directives if needed. HCPOA and living will updated if needed.  Hearing Screening   250Hz  500Hz  1000Hz  2000Hz  4000Hz   Right ear 20 20 20 20 20   Left ear 20 20 20 20 20    Vision  Screening   Right eye Left eye Both eyes  Without correction     With correction 20/20 20/15 20/15    Fall Risk 09/15/2018 09/05/2019 09/29/2019 10/22/2020 11/14/2021  Falls in the past year? 0 0 0 0 0  Patient at Risk for Falls Due to - - - - No Fall Risks   [place] Office Visit from 10/22/2020 in [place] at [facility] Total Score 0      MDD, stable control on sertraline. Trazodone did not help at 50 mg  Due for lab re-eval today of prediabetes high cholesterol and vit D.  Hypertension:   Stable control on HCTZ  BP Readings from Last 3 Encounters:  11/14/21 120/80  10/22/20 120/80  01/31/20 (!) 142/88  Using medication without problems or lightheadedness: none Chest pain with exertion: none Edema:none Short of breath:none Average home BPs: Other issues:      Relevant past medical, surgical, family and social history reviewed and updated as indicated. Interim medical history since our last visit reviewed. Allergies and medications reviewed and updated. Outpatient Medications Prior to Visit  Medication Sig Dispense Refill   gabapentin (NEURONTIN) 100 MG capsule TAKE 1 CAPSULE BY MOUTH 3 TIMES DAILY. START AT 100 MG AT BEDTIME & GRADUALLY INCREASE TO 3XDAY 270 capsule 1   hydrochlorothiazide (HYDRODIURIL)  25 MG tablet TAKE 1 TABLET BY MOUTH EVERY DAY 90 tablet 0   omeprazole (PRILOSEC) 20 MG capsule TAKE 1 CAPSULE (20 MG TOTAL) BY MOUTH 2 (TWO) TIMES DAILY BEFORE A MEAL. 180 capsule 0   sertraline (ZOLOFT) 50 MG tablet TAKE 1 TABLET BY MOUTH EVERY DAY 90 tablet 1   albuterol (VENTOLIN HFA) 108 (90 Base) MCG/ACT inhaler Inhale into the lungs.     amoxicillin-clavulanate (AUGMENTIN) 875-125 MG tablet Take 1 tablet by mouth 2 (two) times daily.     benzonatate (TESSALON) 100 MG capsule Take 100 mg by mouth 3 (three) times daily.     traZODone (DESYREL) 50 MG tablet TAKE 1/2-1 TABLETS BY MOUTH AT BEDTIME AS NEEDED FOR SLEEP. 90 tablet 1   Vitamin D,  Ergocalciferol, (DRISDOL) 1.25 MG (50000 UNIT) CAPS capsule TAKE 1 CAPSULE BY MOUTH ONE TIME PER WEEK 12 capsule 0   No facility-administered medications prior to visit.     Per HPI unless specifically indicated in ROS section below Review of Systems  Constitutional:  Negative for fatigue and fever.  HENT:  Negative for congestion.   Eyes:  Negative for pain.  Respiratory:  Negative for cough and shortness of breath.   Cardiovascular:  Negative for chest pain, palpitations and leg swelling.  Gastrointestinal:  Negative for abdominal pain.  Genitourinary:  Negative for dysuria and vaginal bleeding.  Musculoskeletal:  Negative for back pain.  Neurological:  Negative for syncope, light-headedness and headaches.  Psychiatric/Behavioral:  Negative for dysphoric mood.   Objective:  BP 120/80    Pulse 77    Temp (!) 97.5 F (36.4 C) (Temporal)    Ht 5\' 4"  (1.626 m)    Wt 166 lb 8 oz (75.5 kg)    SpO2 94%    BMI 28.58 kg/m   Wt Readings from Last 3 Encounters:  11/14/21 166 lb 8 oz (75.5 kg)  10/22/20 162 lb 4 oz (73.6 kg)  01/31/20 167 lb (75.8 kg)      Physical Exam Vitals and nursing note reviewed.  Constitutional:      General: She is not in acute distress.    Appearance: Normal appearance. She is well-developed. She is not ill-appearing or toxic-appearing.  HENT:     Head: Normocephalic.     Right Ear: Hearing, tympanic membrane, ear canal and external ear normal.     Left Ear: Hearing, tympanic membrane, ear canal and external ear normal.     Nose: Nose normal.  Eyes:     General: Lids are normal. Lids are everted, no foreign bodies appreciated.     Conjunctiva/sclera: Conjunctivae normal.     Pupils: Pupils are equal, round, and reactive to light.  Neck:     Thyroid: No thyroid mass or thyromegaly.     Vascular: No carotid bruit.     Trachea: Trachea normal.  Cardiovascular:     Rate and Rhythm: Normal rate and regular rhythm.     Heart sounds: Normal heart sounds, S1  normal and S2 normal. No murmur heard.   No gallop.  Pulmonary:     Effort: Pulmonary effort is normal. No respiratory distress.     Breath sounds: Normal breath sounds. No wheezing, rhonchi or rales.  Abdominal:     General: Bowel sounds are normal. There is no distension or abdominal bruit.     Palpations: Abdomen is soft. There is no fluid wave or mass.     Tenderness: There is no abdominal tenderness. There is no guarding  or rebound.     Hernia: No hernia is present.  Musculoskeletal:     Cervical back: Normal range of motion and neck supple.  Lymphadenopathy:     Cervical: No cervical adenopathy.  Skin:    General: Skin is warm and dry.     Findings: No rash.  Neurological:     Mental Status: She is alert.     Cranial Nerves: No cranial nerve deficit.     Sensory: No sensory deficit.  Psychiatric:        Mood and Affect: Mood is not anxious or depressed.        Speech: Speech normal.        Behavior: Behavior normal. Behavior is cooperative.        Judgment: Judgment normal.      Results for orders placed or performed in visit on 11/11/20  Cologuard  Result Value Ref Range   Cologuard Negative Negative    This visit occurred during the SARS-CoV-2 public health emergency.  Safety protocols were in place, including screening questions prior to the visit, additional usage of staff PPE, and extensive cleaning of exam room while observing appropriate contact time as indicated for disinfecting solutions.   COVID 19 screen:  No recent travel or known exposure to COVID19 The patient denies respiratory symptoms of COVID 19 at this time. The importance of social distancing was discussed today.   Assessment and Plan   The patient's preventative maintenance and recommended screening tests for an annual wellness exam were reviewed in full today. Brought up to date unless services declined.  Counselled on the importance of diet, exercise, and its role in overall health and  mortality. The patient's FH and SH was reviewed, including their home life, tobacco status, and drug and alcohol status.   Vaccines:  Consider shingles. Uptodate with tdap, flu, pna 23, 13 Colon:Not sure when last colonoscopy done, likely over 10 years but was nml. She has opted for cologuard .Marland Kitchen Negative in 2022. DEXA: No family history of osteoporosis, got menses average, menopause age 36, no steroid use. She is moderate risk   last done 2016, osteopenia repeat in 5 years. Mammo:stable 09/2018 , personal history of breast cancer..  02/2021.. repeat yearly Pap/DVE: nml pap, neg HPV in 12/2012, no history of abnormal paps no further indicated Asymptomatic and low risk for ovarian or uterine cancer. Nonsmoker.  HEP C:   done   Problem List Items Addressed This Visit     Benign essential hypertension (Chronic)    Stable, chronic.  Continue current medication.    HCTZ 25 mg daily      HYPERCHOLESTEROLEMIA (Chronic)   Relevant Orders   Lipid panel (Completed)   Comprehensive metabolic panel (Completed)   INSOMNIA, CHRONIC (Chronic)    Chronic, poor control Trazodone did not help at 50 mg Will try trial of 100 mg at bedtime.      MDD (major depressive disorder), recurrent episode, moderate (HCC) (Chronic)    Stable, chronic.  Continue current medication.    sertraline 50 mg daily.       Relevant Medications   traZODone (DESYREL) 100 MG tablet   PREDIABETES (Chronic)   Relevant Orders   Hemoglobin A1c (Completed)   Vitamin D deficiency   Relevant Orders   VITAMIN D 25 Hydroxy (Vit-D Deficiency, Fractures) (Completed)   Other Visit Diagnoses     Need for influenza vaccination    -  Primary   Relevant Orders   Flu Vaccine QUAD High  Dose(Fluad) (Completed)   Medicare annual wellness visit, subsequent       Estrogen deficiency       Relevant Orders   DG Bone Density      Orders Placed This Encounter  Procedures   DG Bone Density    Standing Status:   Future     Standing Expiration Date:   11/14/2022    Order Specific Question:   Reason for Exam (SYMPTOM  OR DIAGNOSIS REQUIRED)    Answer:   osteopenia    Order Specific Question:   Preferred imaging location?    Answer:   [facility]   Flu Vaccine QUAD High Dose(Fluad)   Lipid panel   Hemoglobin A1c   Comprehensive metabolic panel   VITAMIN D 25 Hydroxy (Vit-D Deficiency, Fractures)   Meds ordered this encounter  Medications   traZODone (DESYREL) 100 MG tablet    Sig: Take 1 tablet (100 mg total) by mouth at bedtime.    Dispense:  30 tablet    Refill:  0    Eliezer Lofts, MD

## 2021-11-18 ENCOUNTER — Other Ambulatory Visit: Payer: Self-pay

## 2021-11-18 ENCOUNTER — Ambulatory Visit (INDEPENDENT_AMBULATORY_CARE_PROVIDER_SITE_OTHER): Payer: Medicare Other | Admitting: Dermatology

## 2021-11-18 DIAGNOSIS — S40211A Abrasion of right shoulder, initial encounter: Secondary | ICD-10-CM | POA: Diagnosis not present

## 2021-11-18 DIAGNOSIS — T148XXA Other injury of unspecified body region, initial encounter: Secondary | ICD-10-CM

## 2021-11-18 DIAGNOSIS — L821 Other seborrheic keratosis: Secondary | ICD-10-CM

## 2021-11-18 DIAGNOSIS — S20311A Abrasion of right front wall of thorax, initial encounter: Secondary | ICD-10-CM

## 2021-11-18 DIAGNOSIS — L578 Other skin changes due to chronic exposure to nonionizing radiation: Secondary | ICD-10-CM | POA: Diagnosis not present

## 2021-11-18 MED ORDER — MOMETASONE FUROATE 0.1 % EX CREA: TOPICAL_CREAM | CUTANEOUS | 1 refills | Status: AC

## 2021-11-18 NOTE — Assessment & Plan Note (Signed)
Stable, chronic.  Continue current medication.    sertraline 50 mg daily.

## 2021-11-18 NOTE — Progress Notes (Signed)
° °  New Patient Visit  Subjective  Beth Fox is a 71 y.o. female who presents for the following: Skin Problem (New patient here today for hx of redness with stinging and burning at arms and back, patient advises it is worse in the summer. Patient also with a few crusty spots at chest. ).  Patient does not have a hx of skin cancer.   The following portions of the chart were reviewed this encounter and updated as appropriate:       Review of Systems:  No other skin or systemic complaints except as noted in HPI or Assessment and Plan.  Objective  Well appearing patient in no apparent distress; mood and affect are within normal limits.  All skin waist up examined.  right chest, right posterior shoulder Pink excoriations     Assessment & Plan  Excoriation right chest, right posterior shoulder  Possible ISKs vs eczema/dermatitis, currently not flared  Start mometasone cream 1-2 times daily to affected areas as needed for itch. Avoid applying to face, groin, and axilla. Use as directed. Long-term use can cause thinning of the skin.  Pt will RTC if rash recurs and topical treatment not effective  Recommend starting moisturizer with exfoliant (Urea, Salicylic acid, or Lactic acid) one to two times daily to help smooth rough and bumpy skin.  OTC options include Cetaphil Rough and Bumpy lotion (Urea), Eucerin Roughness Relief lotion or spot treatment cream (Urea), CeraVe SA lotion/cream for Rough and Bumpy skin (Sal Acid), Gold Bond Rough and Bumpy cream (Sal Acid), and AmLactin 12% lotion/cream (Lactic Acid).  If applying in morning, also apply sunscreen to sun-exposed areas, since these exfoliating moisturizers can increase sensitivity to sun.  Topical steroids (such as triamcinolone, fluocinolone, fluocinonide, mometasone, clobetasol, halobetasol, betamethasone, hydrocortisone) can cause thinning and lightening of the skin if they are used for too long in the same area. Your  physician has selected the right strength medicine for your problem and area affected on the body. Please use your medication only as directed by your physician to prevent side effects.    mometasone (ELOCON) 0.1 % cream - right chest, right posterior shoulder Apply 1-2 times daily as needed for rash. Avoid applying to face, groin, and axilla. Use as directed. Long-term use can cause thinning of the skin.   Actinic Damage - chronic, secondary to cumulative UV radiation exposure/sun exposure over time - diffuse scaly erythematous macules with underlying dyspigmentation - Recommend daily broad spectrum sunscreen SPF 30+ to sun-exposed areas, reapply every 2 hours as needed.  - Recommend staying in the shade or wearing long sleeves, sun glasses (UVA+UVB protection) and wide brim hats (4-inch brim around the entire circumference of the hat). - Call for new or changing lesions.  Seborrheic Keratoses - Stuck-on, waxy, tan-brown papules and/or plaques left medial breast - Benign-appearing - Discussed benign etiology and prognosis. - Observe - Call for any changes   Return if symptoms worsen or fail to improve.  Graciella Belton, RMA, am acting as scribe for Brendolyn Patty, MD .  Documentation: I have reviewed the above documentation for accuracy and completeness, and I agree with the above.  Brendolyn Patty MD

## 2021-11-18 NOTE — Patient Instructions (Addendum)
Start mometasone cream 1-2 times daily to affected areas as needed for itch. Avoid applying to face, groin, and axilla. Use as directed. Long-term use can cause thinning of the skin.  Recommend starting moisturizer with exfoliant (Urea, Salicylic acid, or Lactic acid) one to two times daily to help smooth rough and bumpy skin.  OTC options include Cetaphil Rough and Bumpy lotion (Urea), Eucerin Roughness Relief lotion or spot treatment cream (Urea), CeraVe SA lotion/cream for Rough and Bumpy skin (Sal Acid), Gold Bond Rough and Bumpy cream (Sal Acid), and AmLactin 12% lotion/cream (Lactic Acid).  If applying in morning, also apply sunscreen to sun-exposed areas, since these exfoliating moisturizers can increase sensitivity to sun.  Topical steroids (such as triamcinolone, fluocinolone, fluocinonide, mometasone, clobetasol, halobetasol, betamethasone, hydrocortisone) can cause thinning and lightening of the skin if they are used for too long in the same area. Your physician has selected the right strength medicine for your problem and area affected on the body. Please use your medication only as directed by your physician to prevent side effects.   Recommend daily broad spectrum sunscreen SPF 30+ to sun-exposed areas, reapply every 2 hours as needed. Call for new or changing lesions.  Staying in the shade or wearing long sleeves, sun glasses (UVA+UVB protection) and wide brim hats (4-inch brim around the entire circumference of the hat) are also recommended for sun protection.   Seborrheic Keratosis  What causes seborrheic keratoses? Seborrheic keratoses are harmless, common skin growths that first appear during adult life.  As time goes by, more growths appear.  Some people may develop a large number of them.  Seborrheic keratoses appear on both covered and uncovered body parts.  They are not caused by sunlight.  The tendency to develop seborrheic keratoses can be inherited.  They vary in color from  skin-colored to gray, brown, or even black.  They can be either smooth or have a rough, warty surface.   Seborrheic keratoses are superficial and look as if they were stuck on the skin.  Under the microscope this type of keratosis looks like layers upon layers of skin.  That is why at times the top layer may seem to fall off, but the rest of the growth remains and re-grows.    Treatment Seborrheic keratoses do not need to be treated, but can easily be removed in the office.  Seborrheic keratoses often cause symptoms when they rub on clothing or jewelry.  Lesions can be in the way of shaving.  If they become inflamed, they can cause itching, soreness, or burning.  Removal of a seborrheic keratosis can be accomplished by freezing, burning, or surgery. If any spot bleeds, scabs, or grows rapidly, please return to have it checked, as these can be an indication of a skin cancer.  Gentle Skin Care Guide  1. Bathe no more than once a day.  2. Avoid bathing in hot water  3. Use a mild soap like Dove, Vanicream, Cetaphil, CeraVe. Can use Lever 2000 or Cetaphil antibacterial soap  4. Use soap only where you need it. On most days, use it under your arms, between your legs, and on your feet. Let the water rinse other areas unless visibly dirty.  5. When you get out of the bath/shower, use a towel to gently blot your skin dry, don't rub it.  6. While your skin is still a little damp, apply a moisturizing cream such as Vanicream, CeraVe, Cetaphil, Eucerin, Sarna lotion or plain Vaseline Jelly. For hands apply  Neutrogena [place] Hand Cream or Excipial Hand Cream.  7. Reapply moisturizer any time you start to itch or feel dry.  8. Sometimes using free and clear laundry detergents can be helpful. Fabric softener sheets should be avoided. Downy Free & Gentle liquid, or any liquid fabric softener that is free of dyes and perfumes, it acceptable to use  9. If your doctor has given you prescription creams you  may apply moisturizers over them    If You Need Anything After Your Visit  If you have any questions or concerns for your doctor, please call our main line at [phone] and press option 4 to reach your doctor's medical assistant. If no one answers, please leave a voicemail as directed and we will return your call as soon as possible. Messages left after 4 pm will be answered the following business day.   You may also send [place] a message via [place]. We typically respond to MyChart messages within 1-2 business days.  For prescription refills, please ask your pharmacy to contact our office. Our fax number is [phone].  If you have an urgent issue when the clinic is closed that cannot wait until the next business day, you can page your doctor at the number below.    Please note that while we do our best to be available for urgent issues outside of office hours, we are not available 24/7.   If you have an urgent issue and are unable to reach [place], you may choose to seek medical care at your doctor's office, retail clinic, urgent care center, or emergency room.  If you have a medical emergency, please immediately call 911 or go to the emergency department.  Pager Numbers  - Dr. Nehemiah Massed: [phone]  - Dr. Laurence Ferrari: [phone]  - Dr. Nicole Kindred: [phone]  In the event of inclement weather, please call our main line at [phone] for an update on the status of any delays or closures.  Dermatology Medication Tips: Please keep the boxes that topical medications come in in order to help keep track of the instructions about where and how to use these. Pharmacies typically print the medication instructions only on the boxes and not directly on the medication tubes.   If your medication is too expensive, please contact our office at [phone] option 4 or send [place] a message through [place].   We are unable to tell what your co-pay for medications will be in advance as this is different  depending on your insurance coverage. However, we may be able to find a substitute medication at lower cost or fill out paperwork to get insurance to cover a needed medication.   If a prior authorization is required to get your medication covered by your insurance company, please allow [place] 1-2 business days to complete this process.  Drug prices often vary depending on where the prescription is filled and some pharmacies may offer cheaper prices.  The website www.goodrx.com contains coupons for medications through different pharmacies. The prices here do not account for what the cost may be with help from insurance (it may be cheaper with your insurance), but the website can give you the price if you did not use any insurance.  - You can print the associated coupon and take it with your prescription to the pharmacy.  - You may also stop by our office during regular business hours and pick up a GoodRx coupon card.  - If you need your prescription sent electronically to a different pharmacy, notify our  office through [facility] or by phone at [phone] option 4.     Si Usted Necesita Algo Despus de Su Visita  Tambin puede enviarnos un mensaje a travs de Pharmacist, community. Por lo general respondemos a los mensajes de MyChart en el transcurso de 1 a 2 das hbiles.  Para renovar recetas, por favor pida a su farmacia que se ponga en contacto con nuestra oficina. Harland Dingwall de fax es [place] [phone].  Si tiene un asunto urgente cuando la clnica est cerrada y que no puede esperar hasta el siguiente da hbil, puede llamar/localizar a su doctor(a) al nmero que aparece a continuacin.   Por favor, tenga en cuenta que aunque hacemos todo lo posible para estar disponibles para asuntos urgentes fuera del horario de [place], no estamos disponibles las 24 horas del da, los 7 das de la [place].   Si tiene un problema urgente y no puede comunicarse con nosotros, puede optar por buscar atencin  mdica  en el consultorio de su doctor(a), en una clnica privada, en un centro de atencin urgente o en una sala de emergencias.  Si tiene Engineering geologist, por favor llame inmediatamente al 911 o vaya a la sala de emergencias.  Nmeros de bper  - Dr. Nehemiah Massed: [phone]  - Dra. Moye: [phone]  - Dra. Nicole Kindred: [phone]  En caso de inclemencias del [place], por favor llame a Johnsie Kindred principal al [phone] para una actualizacin sobre el [place] de cualquier retraso o cierre.  Consejos para la medicacin en dermatologa: Por favor, guarde las cajas en las que vienen los medicamentos de uso tpico para ayudarle a seguir las instrucciones sobre dnde y cmo usarlos. Las farmacias generalmente imprimen las instrucciones del medicamento slo en las cajas y no directamente en los tubos del [place].   Si su medicamento es muy caro, por favor, pngase en contacto con Zigmund Daniel llamando al [phone] y presione la opcin 4 o envenos un mensaje a travs de Pharmacist, community.   No podemos decirle cul ser su copago por los medicamentos por adelantado ya que esto es diferente dependiendo de la cobertura de su seguro. Sin embargo, es posible que podamos encontrar un medicamento sustituto a Electrical engineer un formulario para que el seguro cubra el medicamento que se considera necesario.   Si se requiere una autorizacin previa para que su compaa de seguros [place] su medicamento, por favor permtanos de 1 a 2 das hbiles para completar este proceso.  Los precios de los medicamentos varan con frecuencia dependiendo del Environmental consultant de dnde se surte la receta y alguna farmacias pueden ofrecer precios ms baratos.  El sitio web www.goodrx.com tiene cupones para medicamentos de Airline pilot. Los precios aqu no tienen en cuenta lo que podra costar con la ayuda del seguro (puede ser ms barato con su seguro), pero el sitio web puede darle el precio si no utiliz Conservation officer, historic buildings.  - Puede imprimir el cupn correspondiente y llevarlo con su receta a la farmacia.  - Tambin puede pasar por nuestra oficina durante el horario de atencin regular y Charity fundraiser una tarjeta de cupones de GoodRx.  - Si necesita que su receta se enve electrnicamente a una farmacia diferente, informe a nuestra oficina a travs de MyChart de [place] o por telfono llamando al [phone] y presione la opcin 4.

## 2021-11-18 NOTE — Assessment & Plan Note (Signed)
Chronic, poor control Trazodone did not help at 50 mg Will try trial of 100 mg at bedtime.

## 2021-11-18 NOTE — Assessment & Plan Note (Signed)
Stable, chronic.  Continue current medication.  HCTZ 25 mg daily 

## 2021-12-07 ENCOUNTER — Other Ambulatory Visit: Payer: Self-pay | Admitting: Family Medicine

## 2021-12-20 ENCOUNTER — Other Ambulatory Visit: Payer: Self-pay | Admitting: Family Medicine

## 2022-01-12 ENCOUNTER — Other Ambulatory Visit: Payer: Self-pay | Admitting: Family Medicine

## 2022-01-21 ENCOUNTER — Other Ambulatory Visit: Payer: Self-pay | Admitting: Family Medicine

## 2022-01-21 DIAGNOSIS — Z1231 Encounter for screening mammogram for malignant neoplasm of breast: Secondary | ICD-10-CM

## 2022-02-17 ENCOUNTER — Other Ambulatory Visit: Payer: Self-pay | Admitting: Family Medicine

## 2022-02-17 NOTE — Telephone Encounter (Signed)
Lasted filled 11/14/21 ?Last ov 12/03/21 ?Next ov none  ?

## 2022-03-09 ENCOUNTER — Other Ambulatory Visit: Payer: Self-pay | Admitting: Family Medicine

## 2022-03-19 ENCOUNTER — Ambulatory Visit
Admission: RE | Admit: 2022-03-19 | Discharge: 2022-03-19 | Disposition: A | Discharge status: Home / Self Care | Payer: Medicare Other | Source: Ambulatory Visit | Attending: Family Medicine | Admitting: Family Medicine

## 2022-03-19 DIAGNOSIS — Z1231 Encounter for screening mammogram for malignant neoplasm of breast: Secondary | ICD-10-CM | POA: Insufficient documentation

## 2022-03-19 DIAGNOSIS — E2839 Other primary ovarian failure: Secondary | ICD-10-CM | POA: Insufficient documentation

## 2022-04-07 ENCOUNTER — Other Ambulatory Visit: Payer: Self-pay | Admitting: Family Medicine

## 2022-04-07 DIAGNOSIS — M5416 Radiculopathy, lumbar region: Secondary | ICD-10-CM

## 2022-04-13 ENCOUNTER — Ambulatory Visit
Admission: RE | Admit: 2022-04-13 | Discharge: 2022-04-13 | Disposition: A | Discharge status: Home / Self Care | Payer: Medicare Other | Source: Ambulatory Visit | Attending: Family Medicine | Admitting: Family Medicine

## 2022-04-13 DIAGNOSIS — M5416 Radiculopathy, lumbar region: Secondary | ICD-10-CM

## 2022-06-04 ENCOUNTER — Other Ambulatory Visit: Payer: Self-pay | Admitting: Family Medicine

## 2022-07-08 ENCOUNTER — Ambulatory Visit (INDEPENDENT_AMBULATORY_CARE_PROVIDER_SITE_OTHER): Payer: Medicare Other | Admitting: Family Medicine

## 2022-07-08 ENCOUNTER — Encounter: Payer: Self-pay | Admitting: Family Medicine

## 2022-07-08 VITALS — BP 116/70 | HR 66 | Temp 97.5°F | Ht 64.0 in | Wt 161.0 lb

## 2022-07-08 DIAGNOSIS — F331 Major depressive disorder, recurrent, moderate: Secondary | ICD-10-CM

## 2022-07-08 DIAGNOSIS — I10 Essential (primary) hypertension: Secondary | ICD-10-CM

## 2022-07-08 DIAGNOSIS — M542 Cervicalgia: Secondary | ICD-10-CM

## 2022-07-08 DIAGNOSIS — G8929 Other chronic pain: Secondary | ICD-10-CM

## 2022-07-08 DIAGNOSIS — R42 Dizziness and giddiness: Secondary | ICD-10-CM | POA: Insufficient documentation

## 2022-07-08 DIAGNOSIS — R5383 Other fatigue: Secondary | ICD-10-CM

## 2022-07-08 LAB — CBC WITH DIFFERENTIAL/PLATELET
Basophils Absolute: 0 10*3/uL (ref 0.0–0.1)
Basophils Relative: 0.5 % (ref 0.0–3.0)
Eosinophils Absolute: 0 10*3/uL (ref 0.0–0.7)
Eosinophils Relative: 0.7 % (ref 0.0–5.0)
HCT: 39.6 % (ref 36.0–46.0)
Hemoglobin: 13.3 g/dL (ref 12.0–15.0)
Lymphocytes Relative: 19.5 % (ref 12.0–46.0)
Lymphs Abs: 1.3 10*3/uL (ref 0.7–4.0)
MCHC: 33.6 g/dL (ref 30.0–36.0)
MCV: 90.8 fl (ref 78.0–100.0)
Monocytes Absolute: 0.3 10*3/uL (ref 0.1–1.0)
Monocytes Relative: 4.5 % (ref 3.0–12.0)
Neutro Abs: 5.2 10*3/uL (ref 1.4–7.7)
Neutrophils Relative %: 74.8 % (ref 43.0–77.0)
Platelets: 247 10*3/uL (ref 150.0–400.0)
RBC: 4.36 Mil/uL (ref 3.87–5.11)
RDW: 12.7 % (ref 11.5–15.5)
WBC: 6.9 10*3/uL (ref 4.0–10.5)

## 2022-07-08 LAB — COMPREHENSIVE METABOLIC PANEL
ALT: 18 U/L (ref 0–35)
AST: 18 U/L (ref 0–37)
Albumin: 4.1 g/dL (ref 3.5–5.2)
Alkaline Phosphatase: 76 U/L (ref 39–117)
BUN: 16 mg/dL (ref 6–23)
CO2: 33 mEq/L — ABNORMAL HIGH (ref 19–32)
Calcium: 10.1 mg/dL (ref 8.4–10.5)
Chloride: 100 mEq/L (ref 96–112)
Creatinine, Ser: 1.02 mg/dL (ref 0.40–1.20)
GFR: 55.82 mL/min — ABNORMAL LOW (ref >60.00)
Glucose, Bld: 121 mg/dL — ABNORMAL HIGH (ref 70–99)
Potassium: 3.5 mEq/L (ref 3.5–5.1)
Sodium: 139 mEq/L (ref 135–145)
Total Bilirubin: 0.4 mg/dL (ref 0.2–1.2)
Total Protein: 6.9 g/dL (ref 6.0–8.3)

## 2022-07-08 LAB — VITAMIN B12: Vitamin B-12: 266 pg/mL (ref 211–911)

## 2022-07-08 LAB — TSH: TSH: 0.46 u[IU]/mL (ref 0.35–5.50)

## 2022-07-08 MED ORDER — DULOXETINE HCL 30 MG PO CPEP: ORAL_CAPSULE | ORAL | 3 refills | Status: DC

## 2022-07-08 NOTE — Patient Instructions (Addendum)
Please stop at the lab to have labs drawn.  Push water intake up to 48 oz a day.  Make sure to eat protein and fiber ... Try to eat every 5 hours.  Decrease gabapentin  as able as could be causing side effect.  Stop Sertraline and start Cymbalta.

## 2022-07-08 NOTE — Assessment & Plan Note (Signed)
Chronic, likely multifactorial She is on multiple sedating medications although she rarely uses tramadol.  I have encouraged her to try to decrease gabapentin if able. In part fatigue may be due to poorly controlled major depressive disorder.  Will evaluate labs for secondary causes.

## 2022-07-08 NOTE — Assessment & Plan Note (Signed)
Chronic, poor control.  She feels her chronic back pain is contributing to poor mood.  We discussed options of increasing sertraline versus changing to Cymbalta as a secondary indication for chronic back pain.  She will stop sertraline and start Cymbalta 30 mg daily x1 week then increase to 60 mg daily.  She will follow-up in 4 weeks for for re- evaluation

## 2022-07-08 NOTE — Assessment & Plan Note (Signed)
Acute, likely multifactorial Mildly positive orthostatics in office today.  She gets minimal fluid intake and I have encouraged her to increase her liquids.  She will work on not skipping meals and increased nutrition with protein and fiber in her diet.  We will evaluate with labs for additional secondary causes including B12, thyroid, anemia, electrolyte abnormalities, etc.  Also may be in part due to multiple sedating medications.  We will try to decrease these over time.

## 2022-07-08 NOTE — Assessment & Plan Note (Signed)
Chronic, poor control followed by specialist.  Will start trial of Cymbalta for both major depressive disorder as well as for secondary indication of chronic pain.

## 2022-07-08 NOTE — Progress Notes (Signed)
Patient ID: Beth Fox, female    DOB: 1950-10-15, 71 y.o.   MRN: [id number]  This visit was conducted in person.  BP 116/70 (BP Location: Left Arm, Patient Position: Sitting, Cuff Size: Normal)   Pulse 66   Temp (!) 97.5 F (36.4 C) (Temporal)   Ht '5\' 4"'$  (1.626 m)   Wt 161 lb (73 kg)   SpO2 94%   BMI 27.64 kg/m    CC:  Chief Complaint  Patient presents with   Acute Visit    Patient has been feeling dizzy, foggy and just not feeling well x couple of months.     Subjective:   HPI: Beth Fox is a 71 y.o. female with history of hypertension, prediabetes, major depressive disorder presenting on 07/08/2022 for Acute Visit (Patient has been feeling dizzy, foggy and just not feeling well x couple of months. )    She reports new onset dizziness in last 2 months.She has been having a lot of issues with her back in last year.   Notes lightheadness, like presyncope.. when she is active, worse when bending over or squatting, sitting to standing.  She feels it may be contributing to depression. No please to do things, decreased motivation.  On sertraline 50 mg daily... not helping.  She is limited with activity, but it causes pain.  No CP and no SOB.  She was started on tramadol in 04/2022... occ using it.. no association. Using gabapentin 4 times daily ( 2 at dinner , 2 at bedtime) Helps with restless legs... requip does not help much.   No blood in stool or urine.  No cold intolerance, no edema, no numbness.   Skips meals .Marland Kitchen Only 16 ox water per day.  Relevant past medical, surgical, family and social history reviewed and updated as indicated. Interim medical history since our last visit reviewed. Allergies and medications reviewed and updated. Outpatient Medications Prior to Visit  Medication Sig Dispense Refill   Cholecalciferol 1.25 MG (50000 UT) capsule TAKE 1 CAPSULE BY MOUTH ONE TIME PER WEEK     gabapentin (NEURONTIN) 100 MG capsule TAKE 1 CAPSULE BY MOUTH 3  TIMES DAILY. START AT 100 MG AT BEDTIME & GRADUALLY INCREASE TO 3XDAY 270 capsule 1   hydrochlorothiazide (HYDRODIURIL) 25 MG tablet TAKE 1 TABLET BY MOUTH EVERY DAY 90 tablet 3   mometasone (ELOCON) 0.1 % cream Apply 1-2 times daily as needed for rash. Avoid applying to face, groin, and axilla. Use as directed. Long-term use can cause thinning of the skin. 45 g 1   omeprazole (PRILOSEC) 20 MG capsule TAKE 1 CAPSULE (20 MG TOTAL) BY MOUTH 2 (TWO) TIMES DAILY BEFORE A MEAL. 180 capsule 0   Red Yeast Rice Extract (RED YEAST RICE PO) Take by mouth.     sertraline (ZOLOFT) 50 MG tablet TAKE 1 TABLET BY MOUTH EVERY DAY 90 tablet 1   traMADol (ULTRAM) 50 MG tablet Take 25-50 mg by mouth 2 (two) times daily as needed.     traZODone (DESYREL) 100 MG tablet TAKE 1 TABLET BY MOUTH EVERYDAY AT BEDTIME 90 tablet 1   No facility-administered medications prior to visit.     Per HPI unless specifically indicated in ROS section below Review of Systems  Constitutional:  Negative for fatigue and fever.  HENT:  Negative for congestion.   Eyes:  Negative for pain.  Respiratory:  Negative for cough and shortness of breath.   Cardiovascular:  Negative for chest pain, palpitations and  leg swelling.  Gastrointestinal:  Negative for abdominal pain.  Genitourinary:  Negative for dysuria and vaginal bleeding.  Musculoskeletal:  Negative for back pain.  Neurological:  Negative for syncope, light-headedness and headaches.  Psychiatric/Behavioral:  Negative for dysphoric mood.    Objective:  BP 116/70 (BP Location: Left Arm, Patient Position: Sitting, Cuff Size: Normal)   Pulse 66   Temp (!) 97.5 F (36.4 C) (Temporal)   Ht '5\' 4"'$  (1.626 m)   Wt 161 lb (73 kg)   SpO2 94%   BMI 27.64 kg/m   Wt Readings from Last 3 Encounters:  07/08/22 161 lb (73 kg)  11/14/21 166 lb 8 oz (75.5 kg)  10/22/20 162 lb 4 oz (73.6 kg)      Physical Exam Constitutional:      General: She is not in acute distress.     Appearance: Normal appearance. She is well-developed. She is not ill-appearing or toxic-appearing.  HENT:     Head: Normocephalic.     Right Ear: Hearing, tympanic membrane, ear canal and external ear normal. Tympanic membrane is not erythematous, retracted or bulging.     Left Ear: Hearing, tympanic membrane, ear canal and external ear normal. Tympanic membrane is not erythematous, retracted or bulging.     Nose: No mucosal edema or rhinorrhea.     Right Sinus: No maxillary sinus tenderness or frontal sinus tenderness.     Left Sinus: No maxillary sinus tenderness or frontal sinus tenderness.     Mouth/Throat:     Pharynx: Uvula midline.  Eyes:     General: Lids are normal. Lids are everted, no foreign bodies appreciated.     Conjunctiva/sclera: Conjunctivae normal.     Pupils: Pupils are equal, round, and reactive to light.  Neck:     Thyroid: No thyroid mass or thyromegaly.     Vascular: No carotid bruit.     Trachea: Trachea normal.  Cardiovascular:     Rate and Rhythm: Normal rate and regular rhythm.     Pulses: Normal pulses.     Heart sounds: Normal heart sounds, S1 normal and S2 normal. No murmur heard.    No friction rub. No gallop.  Pulmonary:     Effort: Pulmonary effort is normal. No tachypnea or respiratory distress.     Breath sounds: Normal breath sounds. No decreased breath sounds, wheezing, rhonchi or rales.  Abdominal:     General: Bowel sounds are normal.     Palpations: Abdomen is soft.     Tenderness: There is no abdominal tenderness.  Musculoskeletal:     Cervical back: Normal range of motion and neck supple.  Skin:    General: Skin is warm and dry.     Findings: No rash.  Neurological:     Mental Status: She is alert.  Psychiatric:        Mood and Affect: Mood is depressed. Mood is not anxious. Affect is blunt and flat.        Speech: Speech normal.        Behavior: Behavior is withdrawn. Behavior is cooperative.        Thought Content: Thought content  normal. Thought content does not include homicidal or suicidal ideation. Thought content does not include homicidal or suicidal plan.        Judgment: Judgment normal.       Results for orders placed or performed in visit on 11/14/21  Lipid panel  Result Value Ref Range   Cholesterol 214 (H) 0 -  200 mg/dL   Triglycerides 107.0 0.0 - 149.0 mg/dL   HDL 50.00 >39.00 mg/dL   VLDL 21.4 0.0 - 40.0 mg/dL   LDL Cholesterol 142 (H) 0 - 99 mg/dL   Total CHOL/HDL Ratio 4    NonHDL 163.73   Hemoglobin A1c  Result Value Ref Range   Hgb A1c MFr Bld 6.2 4.6 - 6.5 %  Comprehensive metabolic panel  Result Value Ref Range   Sodium 141 135 - 145 mEq/L   Potassium 3.6 3.5 - 5.1 mEq/L   Chloride 101 96 - 112 mEq/L   CO2 35 (H) 19 - 32 mEq/L   Glucose, Bld 101 (H) 70 - 99 mg/dL   BUN 25 (H) 6 - 23 mg/dL   Creatinine, Ser 1.05 0.40 - 1.20 mg/dL   Total Bilirubin 0.6 0.2 - 1.2 mg/dL   Alkaline Phosphatase 93 39 - 117 U/L   AST 16 0 - 37 U/L   ALT 18 0 - 35 U/L   Total Protein 7.1 6.0 - 8.3 g/dL   Albumin 4.3 3.5 - 5.2 g/dL   GFR 54.16 (L) >60.00 mL/min   Calcium 10.0 8.4 - 10.5 mg/dL  VITAMIN D 25 Hydroxy (Vit-D Deficiency, Fractures)  Result Value Ref Range   VITD 33.85 30.00 - 100.00 ng/mL     COVID 19 screen:  No recent travel or known exposure to COVID19 The patient denies respiratory symptoms of COVID 19 at this time. The importance of social distancing was discussed today.   Assessment and Plan    Problem List Items Addressed This Visit     Benign essential hypertension (Chronic)    Well controled in office today on HCTZ 25 mg daily      MDD (major depressive disorder), recurrent episode, moderate (HCC) (Chronic)    Chronic, poor control.  She feels her chronic back pain is contributing to poor mood.  We discussed options of increasing sertraline versus changing to Cymbalta as a secondary indication for chronic back pain.  She will stop sertraline and start Cymbalta 30 mg daily  x1 week then increase to 60 mg daily.  She will follow-up in 4 weeks for for re- evaluation      Relevant Medications   DULoxetine (CYMBALTA) 30 MG capsule   Lightheaded - Primary    Acute, likely multifactorial Mildly positive orthostatics in office today.  She gets minimal fluid intake and I have encouraged her to increase her liquids.  She will work on not skipping meals and increased nutrition with protein and fiber in her diet.  We will evaluate with labs for additional secondary causes including B12, thyroid, anemia, electrolyte abnormalities, etc.  Also may be in part due to multiple sedating medications.  We will try to decrease these over time.      Relevant Orders   Comprehensive metabolic panel   CBC with Differential/Platelet   Vitamin B12   TSH   Neck pain, chronic    Chronic, poor control followed by specialist.  Will start trial of Cymbalta for both major depressive disorder as well as for secondary indication of chronic pain.      Relevant Medications   traMADol (ULTRAM) 50 MG tablet   DULoxetine (CYMBALTA) 30 MG capsule   Other fatigue    Chronic, likely multifactorial She is on multiple sedating medications although she rarely uses tramadol.  I have encouraged her to try to decrease gabapentin if able. In part fatigue may be due to poorly controlled major depressive disorder.  Will evaluate labs for secondary causes.      Relevant Orders   Comprehensive metabolic panel   CBC with Differential/Platelet   Vitamin B12   TSH     Eliezer Lofts, MD

## 2022-07-08 NOTE — Assessment & Plan Note (Signed)
Well controled in office today on HCTZ 25 mg daily 

## 2022-07-30 ENCOUNTER — Other Ambulatory Visit: Payer: Self-pay | Admitting: Family Medicine

## 2022-08-05 ENCOUNTER — Ambulatory Visit: Payer: Medicare Other | Admitting: Family Medicine

## 2022-08-23 ENCOUNTER — Other Ambulatory Visit: Payer: Self-pay | Admitting: Family Medicine

## 2022-08-23 NOTE — Telephone Encounter (Signed)
Last office visit 07/08/22 for Lightheaded/fatigue.  Last refilled 02/17/22 for #270 with 1 refill.  No future appointments.

## 2022-08-31 ENCOUNTER — Other Ambulatory Visit: Payer: Self-pay | Admitting: Family Medicine

## 2022-08-31 NOTE — Telephone Encounter (Signed)
Patient stated that she think she figured out what was causing her dizziness and she's much better now,and do not think she needs to come in to be seen.

## 2022-08-31 NOTE — Telephone Encounter (Signed)
Please call and schedule follow up appointment with Dr. Diona Browner.  Per last AVS:  Return in about 4 weeks (around 08/05/2022) for  follow up MDD and dizziness

## 2022-11-18 ENCOUNTER — Other Ambulatory Visit: Payer: Self-pay | Admitting: Family Medicine

## 2022-11-27 ENCOUNTER — Other Ambulatory Visit: Payer: Self-pay | Admitting: Family Medicine

## 2022-12-01 ENCOUNTER — Telehealth: Payer: Self-pay | Admitting: Family Medicine

## 2022-12-01 DIAGNOSIS — E559 Vitamin D deficiency, unspecified: Secondary | ICD-10-CM

## 2022-12-01 DIAGNOSIS — R7309 Other abnormal glucose: Secondary | ICD-10-CM

## 2022-12-01 DIAGNOSIS — E7800 Pure hypercholesterolemia, unspecified: Secondary | ICD-10-CM

## 2022-12-01 NOTE — Telephone Encounter (Signed)
-----   Message from Velna Hatchet, RT sent at 12/01/2022  8:13 AM EST ----- Regarding: Wed 2/21 lab Patient is scheduled for cpx, please order future labs.  Thanks, Anda Kraft

## 2022-12-02 ENCOUNTER — Other Ambulatory Visit (INDEPENDENT_AMBULATORY_CARE_PROVIDER_SITE_OTHER): Payer: Medicare HMO

## 2022-12-02 ENCOUNTER — Telehealth: Payer: Self-pay | Admitting: Family Medicine

## 2022-12-02 DIAGNOSIS — E559 Vitamin D deficiency, unspecified: Secondary | ICD-10-CM

## 2022-12-02 DIAGNOSIS — E7800 Pure hypercholesterolemia, unspecified: Secondary | ICD-10-CM | POA: Diagnosis not present

## 2022-12-02 DIAGNOSIS — R7309 Other abnormal glucose: Secondary | ICD-10-CM

## 2022-12-02 LAB — COMPREHENSIVE METABOLIC PANEL
ALT: 15 U/L (ref 0–35)
AST: 15 U/L (ref 0–37)
Albumin: 4.3 g/dL (ref 3.5–5.2)
Alkaline Phosphatase: 88 U/L (ref 39–117)
BUN: 21 mg/dL (ref 6–23)
CO2: 31 mEq/L (ref 19–32)
Calcium: 10.1 mg/dL (ref 8.4–10.5)
Chloride: 100 mEq/L (ref 96–112)
Creatinine, Ser: 1.11 mg/dL (ref 0.40–1.20)
GFR: 50.29 mL/min — ABNORMAL LOW (ref >60.00)
Glucose, Bld: 100 mg/dL — ABNORMAL HIGH (ref 70–99)
Potassium: 3.8 mEq/L (ref 3.5–5.1)
Sodium: 142 mEq/L (ref 135–145)
Total Bilirubin: 0.4 mg/dL (ref 0.2–1.2)
Total Protein: 6.7 g/dL (ref 6.0–8.3)

## 2022-12-02 LAB — LIPID PANEL
Cholesterol: 196 mg/dL (ref 0–200)
HDL: 40.8 mg/dL (ref >39.00)
LDL Cholesterol: 118 mg/dL — ABNORMAL HIGH (ref 0–99)
NonHDL: 155.28
Total CHOL/HDL Ratio: 5
Triglycerides: 188 mg/dL — ABNORMAL HIGH (ref 0.0–149.0)
VLDL: 37.6 mg/dL (ref 0.0–40.0)

## 2022-12-02 LAB — HEMOGLOBIN A1C: Hgb A1c MFr Bld: 5.8 % (ref 4.6–6.5)

## 2022-12-02 LAB — VITAMIN D 25 HYDROXY (VIT D DEFICIENCY, FRACTURES): VITD: 24.69 ng/mL — ABNORMAL LOW (ref 30.00–100.00)

## 2022-12-02 NOTE — Telephone Encounter (Signed)
-----   Message from Velna Hatchet, RT sent at 11/18/2022  3:22 PM EST ----- Regarding: Thu 2/22 lab Patient is scheduled for cpx, please order future labs.  Thanks, Anda Kraft

## 2022-12-03 ENCOUNTER — Other Ambulatory Visit: Payer: Medicare Other

## 2022-12-03 NOTE — Progress Notes (Signed)
No critical labs need to be addressed urgently. We will discuss labs in detail at upcoming office visit.   

## 2022-12-09 ENCOUNTER — Ambulatory Visit: Payer: Medicare HMO

## 2022-12-09 VITALS — Ht 65.0 in | Wt 155.0 lb

## 2022-12-09 DIAGNOSIS — Z0000 Encounter for general adult medical examination without abnormal findings: Secondary | ICD-10-CM

## 2022-12-09 DIAGNOSIS — Z1231 Encounter for screening mammogram for malignant neoplasm of breast: Secondary | ICD-10-CM

## 2022-12-09 NOTE — Patient Instructions (Signed)
Beth Fox , Thank you for taking time to come for your Medicare Wellness Visit. I appreciate your ongoing commitment to your health goals. Please review the following plan we discussed and let me know if I can assist you in the future.   These are the goals we discussed:  Goals      Patient Stated     Starting 09/15/2018, I will continue to take medications as prescribed.         This is a list of the screening recommended for you and due dates:  Health Maintenance  Topic Date Due   COVID-19 Vaccine (1) Never done   Zoster (Shingles) Vaccine (1 of 2) Never done   Flu Shot  05/12/2022   DTaP/Tdap/Td vaccine (2 - Td or Tdap) 01/07/2023   Cologuard (Stool DNA test)  11/01/2023   Medicare Annual Wellness Visit  12/10/2023   Mammogram  03/19/2024   DEXA scan (bone density measurement)  03/20/2027   Pneumonia Vaccine  Completed   Hepatitis C Screening: USPSTF Recommendation to screen - Ages 4-79 yo.  Completed   HPV Vaccine  Aged Out    Advanced directives: yes  Conditions/risks identified: none  Next appointment: Follow up in one year for your annual wellness visit 12/15/2023 '@10'$ :30am telephone   Preventive Care 65 Years and Older, Female Preventive care refers to lifestyle choices and visits with your health care provider that can promote health and wellness. What does preventive care include? A yearly physical exam. This is also called an annual well check. Dental exams once or twice a year. Routine eye exams. Ask your health care provider how often you should have your eyes checked. Personal lifestyle choices, including: Daily care of your teeth and gums. Regular physical activity. Eating a healthy diet. Avoiding tobacco and drug use. Limiting alcohol use. Practicing safe sex. Taking low-dose aspirin every day. Taking vitamin and mineral supplements as recommended by your health care provider. What happens during an annual well check? The services and screenings  done by your health care provider during your annual well check will depend on your age, overall health, lifestyle risk factors, and family history of disease. Counseling  Your health care provider may ask you questions about your: Alcohol use. Tobacco use. Drug use. Emotional well-being. Home and relationship well-being. Sexual activity. Eating habits. History of falls. Memory and ability to understand (cognition). Work and work Statistician. Reproductive health. Screening  You may have the following tests or measurements: Height, weight, and BMI. Blood pressure. Lipid and cholesterol levels. These may be checked every 5 years, or more frequently if you are over 10 years old. Skin check. Lung cancer screening. You may have this screening every year starting at age 47 if you have a 30-pack-year history of smoking and currently smoke or have quit within the past 15 years. Fecal occult blood test (FOBT) of the stool. You may have this test every year starting at age 45. Flexible sigmoidoscopy or colonoscopy. You may have a sigmoidoscopy every 5 years or a colonoscopy every 10 years starting at age 37. Hepatitis C blood test. Hepatitis B blood test. Sexually transmitted disease (STD) testing. Diabetes screening. This is done by checking your blood sugar (glucose) after you have not eaten for a while (fasting). You may have this done every 1-3 years. Bone density scan. This is done to screen for osteoporosis. You may have this done starting at age 21. Mammogram. This may be done every 1-2 years. Talk to your health care  provider about how often you should have regular mammograms. Talk with your health care provider about your test results, treatment options, and if necessary, the need for more tests. Vaccines  Your health care provider may recommend certain vaccines, such as: Influenza vaccine. This is recommended every year. Tetanus, diphtheria, and acellular pertussis (Tdap, Td)  vaccine. You may need a Td booster every 10 years. Zoster vaccine. You may need this after age 67. Pneumococcal 13-valent conjugate (PCV13) vaccine. One dose is recommended after age 52. Pneumococcal polysaccharide (PPSV23) vaccine. One dose is recommended after age 21. Talk to your health care provider about which screenings and vaccines you need and how often you need them. This information is not intended to replace advice given to you by your health care provider. Make sure you discuss any questions you have with your health care provider. Document Released: 10/25/2015 Document Revised: 06/17/2016 Document Reviewed: 07/30/2015 Elsevier Interactive Patient Education  2017 [place] Prevention in the Home Falls can cause injuries. They can happen to people of all ages. There are many things you can do to make your home safe and to help prevent falls. What can I do on the outside of my home? Regularly fix the edges of walkways and driveways and fix any cracks. Remove anything that might make you trip as you walk through a door, such as a raised step or threshold. Trim any bushes or trees on the path to your home. Use bright outdoor lighting. Clear any walking paths of anything that might make someone trip, such as rocks or tools. Regularly check to see if handrails are loose or broken. Make sure that both sides of any steps have handrails. Any raised decks and porches should have guardrails on the edges. Have any leaves, snow, or ice cleared regularly. Use sand or salt on walking paths during winter. Clean up any spills in your garage right away. This includes oil or grease spills. What can I do in the bathroom? Use night lights. Install grab bars by the toilet and in the tub and shower. Do not use towel bars as grab bars. Use non-skid mats or decals in the tub or shower. If you need to sit down in the shower, use a plastic, non-slip stool. Keep the floor dry. Clean up any  water that spills on the floor as soon as it happens. Remove soap buildup in the tub or shower regularly. Attach bath mats securely with double-sided non-slip rug tape. Do not have throw rugs and other things on the floor that can make you trip. What can I do in the bedroom? Use night lights. Make sure that you have a light by your bed that is easy to reach. Do not use any sheets or blankets that are too big for your bed. They should not hang down onto the floor. Have a firm chair that has side arms. You can use this for support while you get dressed. Do not have throw rugs and other things on the floor that can make you trip. What can I do in the kitchen? Clean up any spills right away. Avoid walking on wet floors. Keep items that you use a lot in easy-to-reach places. If you need to reach something above you, use a strong step stool that has a grab bar. Keep electrical cords out of the way. Do not use floor polish or wax that makes floors slippery. If you must use wax, use non-skid floor wax. Do not have throw rugs  and other things on the floor that can make you trip. What can I do with my stairs? Do not leave any items on the stairs. Make sure that there are handrails on both sides of the stairs and use them. Fix handrails that are broken or loose. Make sure that handrails are as long as the stairways. Check any carpeting to make sure that it is firmly attached to the stairs. Fix any carpet that is loose or worn. Avoid having throw rugs at the top or bottom of the stairs. If you do have throw rugs, attach them to the floor with carpet tape. Make sure that you have a light switch at the top of the stairs and the bottom of the stairs. If you do not have them, ask someone to add them for you. What else can I do to help prevent falls? Wear shoes that: Do not have high heels. Have rubber bottoms. Are comfortable and fit you well. Are closed at the toe. Do not wear sandals. If you use a  stepladder: Make sure that it is fully opened. Do not climb a closed stepladder. Make sure that both sides of the stepladder are locked into place. Ask someone to hold it for you, if possible. Clearly mark and make sure that you can see: Any grab bars or handrails. First and last steps. Where the edge of each step is. Use tools that help you move around (mobility aids) if they are needed. These include: Canes. Walkers. Scooters. Crutches. Turn on the lights when you go into a dark area. Replace any light bulbs as soon as they burn out. Set up your furniture so you have a clear path. Avoid moving your furniture around. If any of your floors are uneven, fix them. If there are any pets around you, be aware of where they are. Review your medicines with your doctor. Some medicines can make you feel dizzy. This can increase your chance of falling. Ask your doctor what other things that you can do to help prevent falls. This information is not intended to replace advice given to you by your health care provider. Make sure you discuss any questions you have with your health care provider. Document Released: 07/25/2009 Document Revised: 03/05/2016 Document Reviewed: 11/02/2014 Elsevier Interactive Patient Education  2017 [employer].

## 2022-12-09 NOTE — Progress Notes (Addendum)
I connected with  Beth Fox on 12/09/22 by a audio enabled telemedicine application and verified that I am speaking with the correct person using two identifiers.  Patient Location: Home  Provider Location: Office/Clinic  I discussed the limitations of evaluation and management by telemedicine. The patient expressed understanding and agreed to proceed.  Subjective:   Beth Fox is a 72 y.o. female who presents for Medicare Annual (Subsequent) preventive examination.  Review of Systems    Cardiac Risk Factors include: advanced age (>47mn, >>78women);dyslipidemia;hypertension;sedentary lifestyle     Objective:    Today's Vitals   12/09/22 1003  Weight: 155 lb (70.3 kg)  Height: '5\' 5"'$  (1.651 m)  PainSc: 4    Body mass index is 25.79 kg/m.     12/09/2022   10:17 AM 09/15/2018    8:16 AM  Advanced Directives  Does Patient Have a Medical Advance Directive? Yes Yes  Type of ACorporate treasurerof AAmoritaLiving will  Copy of [place] Chart?  Yes - validated most recent copy scanned in chart (See row information)    Current Medications (verified) Outpatient Encounter Medications as of 12/09/2022  Medication Sig   Cholecalciferol 1.25 MG (50000 UT) capsule TAKE 1 CAPSULE BY MOUTH ONE TIME PER WEEK   gabapentin (NEURONTIN) 100 MG capsule TAKE 1 CAPSULE BY MOUTH 3 TIMES DAILY. START AT 100 MG AT BEDTIME & GRADUALLY INCREASE TO 3XDAY   hydrochlorothiazide (HYDRODIURIL) 25 MG tablet TAKE 1 TABLET BY MOUTH EVERY DAY   mometasone (ELOCON) 0.1 % cream Apply 1-2 times daily as needed for rash. Avoid applying to face, groin, and axilla. Use as directed. Long-term use can cause thinning of the skin.   omeprazole (PRILOSEC) 20 MG capsule TAKE 1 CAPSULE (20 MG TOTAL) BY MOUTH 2 (TWO) TIMES DAILY BEFORE A MEAL.   Red Yeast Rice Extract (RED YEAST RICE PO) Take by mouth.   sertraline (ZOLOFT) 50 MG tablet Take 50 mg by mouth daily.    traMADol (ULTRAM) 50 MG tablet Take 25-50 mg by mouth 2 (two) times daily as needed.   traZODone (DESYREL) 100 MG tablet TAKE 1 TABLET BY MOUTH EVERYDAY AT BEDTIME   DULoxetine (CYMBALTA) 30 MG capsule 1 capsule daily  x 1 week then increase to 2 capsules daily (Patient not taking: Reported on 12/09/2022)   No facility-administered encounter medications on file as of 12/09/2022.    Allergies (verified) Patient has no known allergies.   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 HMilinda Pointer  MASTECTOMY Right Oct 1997   c Reconstruction surgery on Right Breast   REDUCTION MAMMAPLASTY Left 2012   TUBAL LIGATION     Family History  Problem Relation Age of Onset   Diabetes Mother    Hypertension Mother    Stroke Mother    Cancer Father        melonoma   Diabetes Father    Hypertension Father    Parkinsonism Father    Breast cancer Maternal Aunt    Breast cancer Paternal Aunt    Breast cancer Cousin    Hypertension Sister    Arthritis Sister    Stroke  Maternal Grandmother    Stroke Paternal Grandmother    Heart attack Paternal Grandfather        before age 63   Hypertension Sister    Arthritis Sister    Breast cancer Cousin    Social History   Socioeconomic History   Marital status: Married    Spouse name: Not on file   Number of children: 0   Years of education: Not on file   Highest education level: Not on file  Occupational History   Occupation: Scientist, clinical (histocompatibility and immunogenetics): [employer]  Tobacco Use   Smoking status: Never   Smokeless tobacco: Never  Vaping Use   Vaping Use: Never used  Substance and Sexual Activity   Alcohol use: No    Alcohol/week: 0.0 standard drinks of alcohol   Drug use: No   Sexual activity: Not Currently     Birth control/protection: Post-menopausal  Other Topics Concern   Not on file  Social History Narrative   Regular exercise--no   Diet: fast food and sweets   Social Determinants of Health   Financial Resource Strain: Low Risk  (12/09/2022)   Overall Financial Resource Strain (CARDIA)    Difficulty of Paying Living Expenses: Not hard at all  Food Insecurity: No Food Insecurity (12/09/2022)   Hunger Vital Sign    Worried About Running Out of Food in the Last Year: Never true    Ran Out of Food in the Last Year: Never true  Transportation Needs: No Transportation Needs (12/09/2022)   PRAPARE - Transportation    Lack of Transportation (Medical): No    Lack of Transportation (Non-Medical): No  Physical Activity: Inactive (12/09/2022)   Exercise Vital Sign    Days of Exercise per Week: 0 days    Minutes of Exercise per Session: 0 min  Stress: No Stress Concern Present (12/09/2022)   Olean    Feeling of Stress : Not at all  Social Connections: Moderately Integrated (12/09/2022)   Social Connection and Isolation Panel [NHANES]    Frequency of Communication with Friends and Family: More than three times a week    Frequency of Social Gatherings with Friends and Family: Not on file    Attends Religious Services: More than 4 times per year    Active Member of [employer] or Organizations: No    Attends Music therapist: Never    Marital Status: Married    Tobacco Counseling Counseling given: Not Answered   Clinical Intake:  Pre-visit preparation completed: Yes  Pain : 0-10 Pain Score: 4  Pain Type: Chronic pain Pain Location: Back Pain Orientation: Lower Pain Descriptors / Indicators: Aching Pain Onset: More than a month ago Pain Frequency: Constant Pain Relieving Factors: lying down, heating pad, medication  Pain Relieving Factors: lying down, heating pad, medication  BMI - recorded:  25.79 Nutritional Status: BMI 25 -29 Overweight Nutritional Risks: None Diabetes: No  How often do you need to have someone help you when you read instructions, pamphlets, or other written materials from your doctor or pharmacy?: 1 - Never  Diabetic?no  Interpreter Needed?: No  Information entered by :: B.Amry Cathy,LPN   Activities of Daily Living    12/09/2022   10:17 AM  In your present state of health, do you have any difficulty performing the following activities:  Hearing? 0  Vision? 0  Difficulty concentrating or making decisions? 0  Walking or climbing stairs? 0  Dressing or bathing? 0  Doing errands, shopping? 0  Preparing Food and eating ? N  Using the Toilet? N  In the past six months, have you accidently leaked urine? N  Do you have problems with loss of bowel control? N  Managing your Medications? N  Managing your Finances? N  Housekeeping or managing your Housekeeping? N    Patient Care Team: Jinny Sanders, MD as PCP - General  Indicate any recent Medical Services you may have received from other than [facility] providers in the past year (date may be approximate).     Assessment:   This is a routine wellness examination for Beth Fox.  Hearing/Vision screen Hearing Screening - Comments:: Adequate hearing Vision Screening - Comments:: Adequate vision w/glasses [facility]  Dietary issues and exercise activities discussed: Current Exercise Habits: The patient does not participate in regular exercise at present   Goals Addressed             This Visit's Progress    Patient Stated   On track    Starting 09/15/2018, I will continue to take medications as prescribed.        Depression Screen    12/09/2022   10:10 AM 07/08/2022   10:11 AM 11/14/2021   12:03 PM 10/22/2020   10:42 AM 09/29/2019   10:19 AM 09/15/2018    8:17 AM 08/12/2017    3:27 PM  PHQ 2/9 Scores  PHQ - 2 Score 3 5 0 0 0 0 0  PHQ- 9 Score '5 12 6 4 '$ 0 0     Fall Risk     12/09/2022   10:06 AM 11/14/2021   11:14 AM 10/22/2020    9:58 AM 09/29/2019    9:39 AM 09/05/2019    4:16 PM  Fall Risk   Falls in the past year? 0 0 0 0 0  Comment     Emmi Telephone Survey: data to providers prior to load  Number falls in past yr: 0 0     Injury with Fall? 0      Risk for fall due to : No Fall Risks No Fall Risks     Follow up Education provided;Falls prevention discussed        FALL RISK PREVENTION PERTAINING TO THE HOME:  Any stairs in or around the home? Yes  If so, are there any without handrails? Yes  Home free of loose throw rugs in walkways, pet beds, electrical cords, etc? Yes  Adequate lighting in your home to reduce risk of falls? Yes   ASSISTIVE DEVICES UTILIZED TO PREVENT FALLS:  Life alert? No  Use of a cane, walker or w/c? No  Grab bars in the bathroom? No  Shower chair or bench in shower? No  Elevated toilet seat or a handicapped toilet? No    Cognitive Function:    09/15/2018    9:51 AM  MMSE - Mini Mental State Exam  Orientation to time 5  Orientation to Place 5  Registration 3  Attention/ Calculation 0  Recall 3  Language- name 2 objects 0  Language- repeat 1  Language- follow 3 step command 3  Language- read & follow direction 0  Write a sentence 0  Copy design 0  Total score 20        12/09/2022   10:19 AM  6CIT Screen  What Year? 0 points  What month? 0 points  What time? 0 points  Count back from 20 0 points  Months in reverse 0 points  Repeat phrase 2 points  Total Score 2 points    Immunizations Immunization History  Administered Date(s) Administered   Fluad Quad(high Dose 65+) 10/22/2020, 11/14/2021   Influenza Whole 08/08/2009, 09/09/2010   Influenza, High Dose Seasonal PF 07/30/2018   Influenza,inj,Quad PF,6+ Mos 08/29/2013, 11/27/2014, 08/02/2015, 08/12/2017, 08/03/2019   Influenza,inj,quad, With Preservative 07/18/2016   Pneumococcal Conjugate-13 09/29/2019   Pneumococcal Polysaccharide-23 09/15/2018    Tdap 01/06/2013    TDAP status: Up to date  Flu Vaccine status: Up to date  Pneumococcal vaccine status: Up to date  Covid-19 vaccine status: Declined, Education has been provided regarding the importance of this vaccine but patient still declined. Advised may receive this vaccine at local pharmacy or Health Dept.or vaccine clinic. Aware to provide a copy of the vaccination record if obtained from local pharmacy or Health Dept. Verbalized acceptance and understanding.  Qualifies for Shingles Vaccine? Yes   Zostavax completed No   Shingrix Completed?: No.    Education has been provided regarding the importance of this vaccine. Patient has been advised to call insurance company to determine out of pocket expense if they have not yet received this vaccine. Advised may also receive vaccine at local pharmacy or Health Dept. Verbalized acceptance and understanding.  Screening Tests Health Maintenance  Topic Date Due   COVID-19 Vaccine (1) Never done   Zoster Vaccines- Shingrix (1 of 2) Never done   INFLUENZA VACCINE  05/12/2022   DTaP/Tdap/Td (2 - Td or Tdap) 01/07/2023   Fecal DNA (Cologuard)  11/01/2023   Medicare Annual Wellness (AWV)  12/10/2023   MAMMOGRAM  03/19/2024   DEXA SCAN  03/20/2027   Pneumonia Vaccine 69+ Years old  Completed   Hepatitis C Screening  Completed   HPV VACCINES  Aged Out    Health Maintenance  Health Maintenance Due  Topic Date Due   COVID-19 Vaccine (1) Never done   Zoster Vaccines- Shingrix (1 of 2) Never done   INFLUENZA VACCINE  05/12/2022    Colorectal cancer screening: Type of screening: Cologuard. Completed yes. Repeat every 3 years  Mammogram status: Completed yes. Repeat every year  Bone Density status: Completed yes. Results reflect: Bone density results: OSTEOPENIA. Repeat every 5 years.  Lung Cancer Screening: (Low Dose CT Chest recommended if Age 36-80 years, 30 pack-year currently smoking OR have quit w/in 15years.) does not qualify.    Lung Cancer Screening Referral: no  Additional Screening:  Hepatitis C Screening: does not qualify; Completed yes in past  Vision Screening: Recommended annual ophthalmology exams for early detection of glaucoma and other disorders of the eye. Is the patient up to date with their annual eye exam?  Yes  Who is the provider or what is the name of the office in which the patient attends annual eye exams? [place] If pt is not established with a provider, would they like to be referred to a provider to establish care? No .   Dental Screening: Recommended annual dental exams for proper oral hygiene  Community Resource Referral / Chronic Care Management: CRR required this visit?  No   CCM required this visit?  No      Plan:     I have personally reviewed and noted the following in the patient's chart:   Medical and social history Use of alcohol, tobacco or illicit drugs  Current medications and supplements including opioid prescriptions. Patient is currently taking opioid prescriptions. Information provided to patient regarding non-opioid alternatives. Patient advised to discuss non-opioid treatment plan  with their provider. Tramadol Functional ability and status Nutritional status Physical activity Advanced directives List of other physicians Hospitalizations, surgeries, and ER visits in previous 12 months Vitals Screenings to include cognitive, depression, and falls Referrals and appointments  In addition, I have reviewed and discussed with patient certain preventive protocols, quality metrics, and best practice recommendations. A written personalized care plan for preventive services as well as general preventive health recommendations were provided to patient.     Roger Shelter, LPN   [id number]   Nurse Notes: pt is doing well:has no concerns or questions at this time.  Order/referral for MMG placed for future (after March 20 2023).

## 2022-12-10 ENCOUNTER — Ambulatory Visit (INDEPENDENT_AMBULATORY_CARE_PROVIDER_SITE_OTHER): Payer: Medicare HMO | Admitting: Family Medicine

## 2022-12-10 VITALS — BP 110/70 | HR 69 | Temp 98.2°F | Ht 63.5 in | Wt 158.1 lb

## 2022-12-10 DIAGNOSIS — F331 Major depressive disorder, recurrent, moderate: Secondary | ICD-10-CM | POA: Diagnosis not present

## 2022-12-10 DIAGNOSIS — Z23 Encounter for immunization: Secondary | ICD-10-CM

## 2022-12-10 DIAGNOSIS — E559 Vitamin D deficiency, unspecified: Secondary | ICD-10-CM

## 2022-12-10 DIAGNOSIS — Z0000 Encounter for general adult medical examination without abnormal findings: Secondary | ICD-10-CM

## 2022-12-10 DIAGNOSIS — E7800 Pure hypercholesterolemia, unspecified: Secondary | ICD-10-CM

## 2022-12-10 DIAGNOSIS — I10 Essential (primary) hypertension: Secondary | ICD-10-CM | POA: Diagnosis not present

## 2022-12-10 DIAGNOSIS — R69 Illness, unspecified: Secondary | ICD-10-CM | POA: Diagnosis not present

## 2022-12-10 MED ORDER — CHOLECALCIFEROL 1.25 MG (50000 UT) PO CAPS: ORAL_CAPSULE | ORAL | 0 refills | Status: DC

## 2022-12-10 NOTE — Assessment & Plan Note (Addendum)
Chronic, increased risk of cardiovascular disease: 10-year ASCVD risk score 9.9%  Currently taking red yeast rice... will try to increase dose.  NOt currently interested in statin prescription.

## 2022-12-10 NOTE — Assessment & Plan Note (Signed)
Replete with 12 week course, then follow with OTC daily supplementation.

## 2022-12-10 NOTE — Progress Notes (Signed)
Patient ID: Beth Fox, female    DOB: 28-Apr-1951, 72 y.o.   MRN: [id number]  This visit was conducted in person.  BP 110/70   Pulse 69   Temp 98.2 F (36.8 C) (Temporal)   Ht 5' 3.5" (1.613 m)   Wt 158 lb 2 oz (71.7 kg)   SpO2 95%   BMI 27.57 kg/m    CC:  Chief Complaint  Patient presents with   Annual Exam    Part 2   HPI: DEVANI Fox is a 72 y.o. female presenting on 12/10/2022 for Annual Exam (Part 2)  The patient presents for  complete physical and review of chronic health problems. He/She also has the following acute concerns today: none  The patient saw a LPN or RN for medicare wellness visit.  Prevention and wellness was reviewed in detail. Note reviewed and important notes copied below.    Advance directives and end of life planning reviewed in detail with patient and documented in EMR. Patient given handout on advance care directives if needed. HCPOA and living will updated if needed.  MDD, stable control on sertraline 50 mg daily, trazodone 100 mg p.o. nightly Flowsheet Row Clinical Support from 12/09/2022 in [place] at [facility] Total Score 3      Elevated Cholesterol: Inadequate control of cholesterol on red yeast rice 600 mg daily Lab Results  Component Value Date   CHOL 196 12/02/2022   HDL 40.80 12/02/2022   LDLCALC 118 (H) 12/02/2022   LDLDIRECT 167.7 09/01/2013   TRIG 188.0 (H) 12/02/2022   CHOLHDL 5 12/02/2022   The 10-year ASCVD risk score (Arnett DK, et al., 2019) is: 9.9%   Values used to calculate the score:     Age: 72 years     Sex: Female     Is Non-Hispanic African American: No     Diabetic: No     Tobacco smoker: No     Systolic Blood Pressure: [id number] mmHg     Is BP treated: Yes     HDL Cholesterol: 40.8 mg/dL     Total Cholesterol: 196 mg/dL Using medications without problems: none Muscle aches:  none Diet compliance: moderate Exercise:  none Other complaints:   Hypertension:    Stable control on HCTZ  BP Readings from Last 3 Encounters:  12/10/22 110/70  07/08/22 116/70  11/14/21 120/80  Using medication without problems or lightheadedness: none Chest pain with exertion: none Edema:none Short of breath:none Average home BPs: Other issues:     Relevant past medical, surgical, family and social history reviewed and updated as indicated. Interim medical history since our last visit reviewed. Allergies and medications reviewed and updated. Outpatient Medications Prior to Visit  Medication Sig Dispense Refill   gabapentin (NEURONTIN) 100 MG capsule TAKE 1 CAPSULE BY MOUTH 3 TIMES DAILY. START AT 100 MG AT BEDTIME & GRADUALLY INCREASE TO 3XDAY 270 capsule 1   hydrochlorothiazide (HYDRODIURIL) 25 MG tablet TAKE 1 TABLET BY MOUTH EVERY DAY 90 tablet 3   mometasone (ELOCON) 0.1 % cream Apply 1-2 times daily as needed for rash. Avoid applying to face, groin, and axilla. Use as directed. Long-term use can cause thinning of the skin. 45 g 1   omeprazole (PRILOSEC) 20 MG capsule TAKE 1 CAPSULE (20 MG TOTAL) BY MOUTH 2 (TWO) TIMES DAILY BEFORE A MEAL. 180 capsule 0   Red Yeast Rice Extract (RED YEAST RICE PO) Take by mouth.     sertraline (  ZOLOFT) 50 MG tablet Take 50 mg by mouth daily.     traMADol (ULTRAM) 50 MG tablet Take 25-50 mg by mouth 2 (two) times daily as needed.     traZODone (DESYREL) 100 MG tablet TAKE 1 TABLET BY MOUTH EVERYDAY AT BEDTIME 90 tablet 0   Cholecalciferol 1.25 MG (50000 UT) capsule TAKE 1 CAPSULE BY MOUTH ONE TIME PER WEEK     DULoxetine (CYMBALTA) 30 MG capsule 1 capsule daily  x 1 week then increase to 2 capsules daily (Patient not taking: Reported on 12/09/2022) 60 capsule 3   No facility-administered medications prior to visit.     Per HPI unless specifically indicated in ROS section below Review of Systems  Constitutional:  Negative for fatigue and fever.  HENT:  Negative for congestion.   Eyes:  Negative for pain.  Respiratory:   Negative for cough and shortness of breath.   Cardiovascular:  Negative for chest pain, palpitations and leg swelling.  Gastrointestinal:  Negative for abdominal pain.  Genitourinary:  Negative for dysuria and vaginal bleeding.  Musculoskeletal:  Negative for back pain.  Neurological:  Negative for syncope, light-headedness and headaches.  Psychiatric/Behavioral:  Negative for dysphoric mood.    Objective:  BP 110/70   Pulse 69   Temp 98.2 F (36.8 C) (Temporal)   Ht 5' 3.5" (1.613 m)   Wt 158 lb 2 oz (71.7 kg)   SpO2 95%   BMI 27.57 kg/m   Wt Readings from Last 3 Encounters:  12/10/22 158 lb 2 oz (71.7 kg)  12/09/22 155 lb (70.3 kg)  07/08/22 161 lb (73 kg)      Physical Exam Vitals and nursing note reviewed.  Constitutional:      General: She is not in acute distress.    Appearance: Normal appearance. She is well-developed. She is not ill-appearing or toxic-appearing.  HENT:     Head: Normocephalic.     Right Ear: Hearing, tympanic membrane, ear canal and external ear normal.     Left Ear: Hearing, tympanic membrane, ear canal and external ear normal.     Nose: Nose normal.  Eyes:     General: Lids are normal. Lids are everted, no foreign bodies appreciated.     Conjunctiva/sclera: Conjunctivae normal.     Pupils: Pupils are equal, round, and reactive to light.  Neck:     Thyroid: No thyroid mass or thyromegaly.     Vascular: No carotid bruit.     Trachea: Trachea normal.  Cardiovascular:     Rate and Rhythm: Normal rate and regular rhythm.     Heart sounds: Normal heart sounds, S1 normal and S2 normal. No murmur heard.    No gallop.  Pulmonary:     Effort: Pulmonary effort is normal. No respiratory distress.     Breath sounds: Normal breath sounds. No wheezing, rhonchi or rales.  Abdominal:     General: Bowel sounds are normal. There is no distension or abdominal bruit.     Palpations: Abdomen is soft. There is no fluid wave or mass.     Tenderness: There is no  abdominal tenderness. There is no guarding or rebound.     Hernia: No hernia is present.  Musculoskeletal:     Cervical back: Normal range of motion and neck supple.  Lymphadenopathy:     Cervical: No cervical adenopathy.  Skin:    General: Skin is warm and dry.     Findings: No rash.  Neurological:     Mental  Status: She is alert.     Cranial Nerves: No cranial nerve deficit.     Sensory: No sensory deficit.  Psychiatric:        Mood and Affect: Mood is not anxious or depressed.        Speech: Speech normal.        Behavior: Behavior normal. Behavior is cooperative.        Judgment: Judgment normal.       Results for orders placed or performed in visit on 12/02/22  VITAMIN D 25 Hydroxy (Vit-D Deficiency, Fractures)  Result Value Ref Range   VITD 24.69 (L) 30.00 - 100.00 ng/mL  Hemoglobin A1c  Result Value Ref Range   Hgb A1c MFr Bld 5.8 4.6 - 6.5 %  Comprehensive metabolic panel  Result Value Ref Range   Sodium 142 135 - 145 mEq/L   Potassium 3.8 3.5 - 5.1 mEq/L   Chloride 100 96 - 112 mEq/L   CO2 31 19 - 32 mEq/L   Glucose, Bld 100 (H) 70 - 99 mg/dL   BUN 21 6 - 23 mg/dL   Creatinine, Ser 1.11 0.40 - 1.20 mg/dL   Total Bilirubin 0.4 0.2 - 1.2 mg/dL   Alkaline Phosphatase 88 39 - 117 U/L   AST 15 0 - 37 U/L   ALT 15 0 - 35 U/L   Total Protein 6.7 6.0 - 8.3 g/dL   Albumin 4.3 3.5 - 5.2 g/dL   GFR 50.29 (L) >60.00 mL/min   Calcium 10.1 8.4 - 10.5 mg/dL  Lipid panel  Result Value Ref Range   Cholesterol 196 0 - 200 mg/dL   Triglycerides 188.0 (H) 0.0 - 149.0 mg/dL   HDL 40.80 >39.00 mg/dL   VLDL 37.6 0.0 - 40.0 mg/dL   LDL Cholesterol 118 (H) 0 - 99 mg/dL   Total CHOL/HDL Ratio 5    NonHDL 155.28     This visit occurred during the SARS-CoV-2 public health emergency.  Safety protocols were in place, including screening questions prior to the visit, additional usage of staff PPE, and extensive cleaning of exam room while observing appropriate contact time as  indicated for disinfecting solutions.   COVID 19 screen:  No recent travel or known exposure to COVID19 The patient denies respiratory symptoms of COVID 19 at this time. The importance of social distancing was discussed today.   Assessment and Plan   The patient's preventative maintenance and recommended screening tests for an annual wellness exam were reviewed in full today. Brought up to date unless services declined.  Counselled on the importance of diet, exercise, and its role in overall health and mortality. The patient's FH and SH was reviewed, including their home life, tobacco status, and drug and alcohol status.   Vaccines:  Consider shingles. Uptodate with tdap, given flu, pna 23, 13 Colon:Not sure when last colonoscopy done, likely over 10 years but was nml. She has opted for cologuard .Marland Kitchen Negative in 2022. DEXA: No family history of osteoporosis,  DEXA 2016, osteopenia, 2023 osteopenia -2.1 increased in hip, decreased in spine. Mammo:stable 09/2018 , personal history of breast cancer..  03/2022.. repeat yearly Pap/DVE: nml pap, neg HPV in 12/2012, no history of abnormal paps no further indicated Asymptomatic and low risk for ovarian or uterine cancer. Nonsmoker.  HEP C:   done   Problem List Items Addressed This Visit     Benign essential hypertension (Chronic)    Well controled in office today on HCTZ 25 mg daily  HYPERCHOLESTEROLEMIA (Chronic)    Chronic, increased risk of cardiovascular disease: 10-year ASCVD risk score 9.9%  Currently taking red yeast rice... will try to increase dose.  NOt currently interested in statin prescription.        MDD (major depressive disorder), recurrent episode, moderate (HCC) (Chronic)    Stable, chronic.  Continue current medication.  Sertraline 50 mg daily. Trazodone 100 mg p.o. nightly      Vitamin D deficiency    Replete with 12 week course, then follow with OTC daily supplementation.      Other Visit Diagnoses      Routine general medical examination at a health care facility    -  Primary   Need for influenza vaccination       Relevant Orders   Flu Vaccine QUAD High Dose(Fluad) (Completed)      Orders Placed This Encounter  Procedures   Flu Vaccine QUAD High Dose(Fluad)   Meds ordered this encounter  Medications   Cholecalciferol 1.25 MG (50000 UT) capsule    Sig: TAKE 1 CAPSULE BY MOUTH ONE TIME PER WEEK    Dispense:  12 capsule    Refill:  0    Eliezer Lofts, MD

## 2022-12-10 NOTE — Assessment & Plan Note (Addendum)
Stable, chronic.  Continue current medication.  Sertraline 50 mg daily. Trazodone 100 mg p.o. nightly

## 2022-12-10 NOTE — Assessment & Plan Note (Signed)
Well controled in office today on HCTZ 25 mg daily

## 2022-12-10 NOTE — Patient Instructions (Addendum)
Increase red yeast rice to 600 mg 2 capsules daily, if no side effects  increase 2 capsules twice daily.  Restart walking  or low impact exercise.  Increase water intake.  Use tylenol instead of ibuprofen or aleve for low back pain.  Repeat vit D 12 week course, after start daily 1000 IU vit D OTC.

## 2023-01-06 DIAGNOSIS — Z79899 Other long term (current) drug therapy: Secondary | ICD-10-CM | POA: Diagnosis not present

## 2023-01-06 DIAGNOSIS — M5416 Radiculopathy, lumbar region: Secondary | ICD-10-CM | POA: Diagnosis not present

## 2023-01-06 DIAGNOSIS — M5126 Other intervertebral disc displacement, lumbar region: Secondary | ICD-10-CM | POA: Diagnosis not present

## 2023-01-06 DIAGNOSIS — M5136 Other intervertebral disc degeneration, lumbar region: Secondary | ICD-10-CM | POA: Diagnosis not present

## 2023-01-19 DIAGNOSIS — M5416 Radiculopathy, lumbar region: Secondary | ICD-10-CM | POA: Diagnosis not present

## 2023-01-19 DIAGNOSIS — M5126 Other intervertebral disc displacement, lumbar region: Secondary | ICD-10-CM | POA: Diagnosis not present

## 2023-01-22 ENCOUNTER — Other Ambulatory Visit: Payer: Self-pay | Admitting: Family Medicine

## 2023-02-09 DIAGNOSIS — M5126 Other intervertebral disc displacement, lumbar region: Secondary | ICD-10-CM | POA: Diagnosis not present

## 2023-02-09 DIAGNOSIS — M5416 Radiculopathy, lumbar region: Secondary | ICD-10-CM | POA: Diagnosis not present

## 2023-02-09 DIAGNOSIS — M5136 Other intervertebral disc degeneration, lumbar region: Secondary | ICD-10-CM | POA: Diagnosis not present

## 2023-02-09 DIAGNOSIS — M48062 Spinal stenosis, lumbar region with neurogenic claudication: Secondary | ICD-10-CM | POA: Diagnosis not present

## 2023-02-10 ENCOUNTER — Other Ambulatory Visit: Payer: Self-pay | Admitting: Family Medicine

## 2023-02-10 DIAGNOSIS — M5416 Radiculopathy, lumbar region: Secondary | ICD-10-CM

## 2023-02-15 ENCOUNTER — Other Ambulatory Visit: Payer: Self-pay | Admitting: Family Medicine

## 2023-02-15 DIAGNOSIS — M5416 Radiculopathy, lumbar region: Secondary | ICD-10-CM

## 2023-02-16 ENCOUNTER — Ambulatory Visit
Admission: RE | Admit: 2023-02-16 | Discharge: 2023-02-16 | Disposition: A | Discharge status: Home / Self Care | Payer: Medicare HMO | Source: Ambulatory Visit | Attending: Family Medicine | Admitting: Family Medicine

## 2023-02-16 DIAGNOSIS — M545 Low back pain, unspecified: Secondary | ICD-10-CM | POA: Diagnosis not present

## 2023-02-16 DIAGNOSIS — M25559 Pain in unspecified hip: Secondary | ICD-10-CM | POA: Diagnosis not present

## 2023-02-16 DIAGNOSIS — M5416 Radiculopathy, lumbar region: Secondary | ICD-10-CM

## 2023-02-23 ENCOUNTER — Other Ambulatory Visit: Payer: Self-pay | Admitting: Family Medicine

## 2023-02-25 DIAGNOSIS — M48062 Spinal stenosis, lumbar region with neurogenic claudication: Secondary | ICD-10-CM | POA: Diagnosis not present

## 2023-02-25 DIAGNOSIS — M5431 Sciatica, right side: Secondary | ICD-10-CM | POA: Diagnosis not present

## 2023-02-25 DIAGNOSIS — M5432 Sciatica, left side: Secondary | ICD-10-CM | POA: Diagnosis not present

## 2023-02-26 ENCOUNTER — Telehealth: Payer: Self-pay | Admitting: Family Medicine

## 2023-02-26 DIAGNOSIS — Z0279 Encounter for issue of other medical certificate: Secondary | ICD-10-CM

## 2023-02-26 NOTE — Telephone Encounter (Signed)
Type of forms received: surgical clearance   Routed to: bedsole pool   Paperwork received by : Audree Camel    Individual made aware of 3-5 business day turn around (Y/N): Y   Form completed and patient made aware of charges(Y/N): Y    Faxed to : pt rrquested ppw be faxed to office @ [phone]  Form location:  bedsole's folder

## 2023-02-26 NOTE — Telephone Encounter (Signed)
Spoke with Beth Fox.  She denies any new CP or SOB on exertion.  She states she is fine.  FYI to Dr. Ermalene Searing.

## 2023-02-26 NOTE — Telephone Encounter (Signed)
Surgical Clearance form placed in Dr. Daphine Deutscher office in box to completed.

## 2023-02-26 NOTE — Telephone Encounter (Signed)
Left message for Ms. Obsborne to return my call in regards to her surgical clearance paperwork.

## 2023-02-26 NOTE — Telephone Encounter (Signed)
Completed surgical clearance form faxed to [facility] [phone].

## 2023-02-26 NOTE — Telephone Encounter (Signed)
Patient called in returning a call she received.  

## 2023-02-26 NOTE — Telephone Encounter (Signed)
Call I reviewed February physical where everything was in the normal range with no concerns about upcoming surgery.  I have completed surgical clearance form in my outbox.  Please verify that there have not been changes since her February physical such as new chest pain shortness of breath on exertion, etc. If there are she needs to be seen in office before finalizing plans form.

## 2023-03-09 ENCOUNTER — Other Ambulatory Visit: Payer: Self-pay | Admitting: Family Medicine

## 2023-03-09 NOTE — Telephone Encounter (Signed)
Last office visit 12/10/22 for CPE.  Last refilled 08/24/22 for #270 with 1 refill.  Next appt: No future appointments with PCP.

## 2023-03-26 DIAGNOSIS — M5431 Sciatica, right side: Secondary | ICD-10-CM | POA: Diagnosis not present

## 2023-03-26 DIAGNOSIS — M4316 Spondylolisthesis, lumbar region: Secondary | ICD-10-CM | POA: Diagnosis not present

## 2023-03-26 DIAGNOSIS — M48062 Spinal stenosis, lumbar region with neurogenic claudication: Secondary | ICD-10-CM | POA: Diagnosis not present

## 2023-03-26 DIAGNOSIS — M5432 Sciatica, left side: Secondary | ICD-10-CM | POA: Diagnosis not present

## 2023-03-29 DIAGNOSIS — Z5181 Encounter for therapeutic drug level monitoring: Secondary | ICD-10-CM | POA: Diagnosis not present

## 2023-03-29 DIAGNOSIS — M545 Low back pain, unspecified: Secondary | ICD-10-CM | POA: Diagnosis not present

## 2023-03-29 DIAGNOSIS — M4316 Spondylolisthesis, lumbar region: Secondary | ICD-10-CM | POA: Diagnosis not present

## 2023-03-29 DIAGNOSIS — M5432 Sciatica, left side: Secondary | ICD-10-CM | POA: Diagnosis not present

## 2023-03-29 DIAGNOSIS — Z01810 Encounter for preprocedural cardiovascular examination: Secondary | ICD-10-CM | POA: Diagnosis not present

## 2023-03-29 DIAGNOSIS — Z79899 Other long term (current) drug therapy: Secondary | ICD-10-CM | POA: Diagnosis not present

## 2023-03-29 DIAGNOSIS — M48062 Spinal stenosis, lumbar region with neurogenic claudication: Secondary | ICD-10-CM | POA: Diagnosis not present

## 2023-03-29 DIAGNOSIS — M5431 Sciatica, right side: Secondary | ICD-10-CM | POA: Diagnosis not present

## 2023-03-29 DIAGNOSIS — Z01818 Encounter for other preprocedural examination: Secondary | ICD-10-CM | POA: Diagnosis not present

## 2023-03-31 ENCOUNTER — Telehealth: Payer: Self-pay | Admitting: Family Medicine

## 2023-03-31 ENCOUNTER — Other Ambulatory Visit: Payer: Self-pay | Admitting: Family Medicine

## 2023-03-31 MED ORDER — OMEPRAZOLE 20 MG PO CPDR: 20.0000 mg | DELAYED_RELEASE_CAPSULE | Freq: Two times a day (BID) | ORAL | 1 refills | Status: DC

## 2023-03-31 NOTE — Telephone Encounter (Signed)
Prescription Request  03/31/2023  LOV: 12/10/2022  What is the name of the medication or equipment?  omeprazole (PRILOSEC) 20 MG capsule  Have you contacted your pharmacy to request a refill? Yes   Which pharmacy would you like this sent to?  [id number] - [place], [place] - [address] [place] [address] Phone: [phone] Fax: [phone]     Patient notified that their request is being sent to the clinical staff for review and that they should receive a response within 2 business days.   Please advise at Mobile [phone] (mobile)

## 2023-03-31 NOTE — Telephone Encounter (Signed)
Refill sent as requested. 

## 2023-03-31 NOTE — Telephone Encounter (Signed)
Last office visit 12/10/2022 for CPE.  Last refilled 12/10/22 for #12 with no refills.  Last Vit D level 12/02/22 which was low at 24.69 ng/mL.  Next Appt:  No future appointments with PCP.

## 2023-04-13 DIAGNOSIS — M48062 Spinal stenosis, lumbar region with neurogenic claudication: Secondary | ICD-10-CM | POA: Diagnosis not present

## 2023-04-13 DIAGNOSIS — M5431 Sciatica, right side: Secondary | ICD-10-CM | POA: Diagnosis not present

## 2023-04-13 DIAGNOSIS — M5432 Sciatica, left side: Secondary | ICD-10-CM | POA: Diagnosis not present

## 2023-04-13 DIAGNOSIS — M4316 Spondylolisthesis, lumbar region: Secondary | ICD-10-CM | POA: Diagnosis not present

## 2023-04-16 DIAGNOSIS — M47816 Spondylosis without myelopathy or radiculopathy, lumbar region: Secondary | ICD-10-CM | POA: Diagnosis not present

## 2023-04-16 DIAGNOSIS — M48062 Spinal stenosis, lumbar region with neurogenic claudication: Secondary | ICD-10-CM | POA: Diagnosis not present

## 2023-04-16 DIAGNOSIS — M4316 Spondylolisthesis, lumbar region: Secondary | ICD-10-CM | POA: Diagnosis not present

## 2023-04-20 ENCOUNTER — Other Ambulatory Visit: Payer: Self-pay | Admitting: Family Medicine

## 2023-05-03 DIAGNOSIS — M47816 Spondylosis without myelopathy or radiculopathy, lumbar region: Secondary | ICD-10-CM | POA: Diagnosis not present

## 2023-05-31 ENCOUNTER — Other Ambulatory Visit: Payer: Self-pay | Admitting: Orthopedic Surgery

## 2023-05-31 DIAGNOSIS — M5432 Sciatica, left side: Secondary | ICD-10-CM

## 2023-05-31 DIAGNOSIS — M47816 Spondylosis without myelopathy or radiculopathy, lumbar region: Secondary | ICD-10-CM

## 2023-05-31 DIAGNOSIS — M48062 Spinal stenosis, lumbar region with neurogenic claudication: Secondary | ICD-10-CM

## 2023-05-31 DIAGNOSIS — M4316 Spondylolisthesis, lumbar region: Secondary | ICD-10-CM

## 2023-05-31 DIAGNOSIS — M5431 Sciatica, right side: Secondary | ICD-10-CM

## 2023-06-04 ENCOUNTER — Ambulatory Visit: Admission: RE | Admit: 2023-06-04 | Payer: Medicare Other | Source: Ambulatory Visit

## 2023-06-04 DIAGNOSIS — M5431 Sciatica, right side: Secondary | ICD-10-CM

## 2023-06-04 DIAGNOSIS — M48062 Spinal stenosis, lumbar region with neurogenic claudication: Secondary | ICD-10-CM

## 2023-06-04 DIAGNOSIS — M4316 Spondylolisthesis, lumbar region: Secondary | ICD-10-CM

## 2023-06-04 DIAGNOSIS — M47816 Spondylosis without myelopathy or radiculopathy, lumbar region: Secondary | ICD-10-CM

## 2023-06-04 DIAGNOSIS — M5432 Sciatica, left side: Secondary | ICD-10-CM

## 2023-06-18 ENCOUNTER — Telehealth: Payer: Self-pay | Admitting: Neurosurgery

## 2023-06-18 NOTE — Telephone Encounter (Signed)
Ok to schedule for 2nd opinion.

## 2023-06-18 NOTE — Telephone Encounter (Signed)
Patient has seen Dr.Morales in the past. She is currently seeing Dr.Torrealba at [facility]. MRI 02/2023 and recent CT 06/04/23. He had initially offered her surgery but then with the CT results he said that it would be too risky. She would like a 2nd opinion with Dr.Yarbrough.

## 2023-06-21 NOTE — Telephone Encounter (Signed)
She confirmed appt for 06/23/2023

## 2023-06-22 NOTE — Progress Notes (Unsigned)
Referring Physician:  No referring provider defined for this encounter.  Primary Physician:  Excell Seltzer, MD  History of Present Illness: 06/24/2023 [name] is here today with a chief complaint of significant low back pain that has worsened over the past several years.  She has tried physical therapy for this without improvement.  Standing, walking, twisting, sitting, bending make it worse.  Sometimes she gets pain into her right hip.  She has been evaluated by an outside surgeon who recommended a major surgical intervention.  She is here today for second opinion.  Bowel/Bladder Dysfunction: none  Conservative measures:  Physical therapy:  has participated in PT at [facility] in [place] Multimodal medical therapy including regular antiinflammatories:  tylenol, meloxicam, gabapentin, prednisone taper Tramadol Injections:  has received epidural steroid injections 01/19/2023: Left L5-S1 transforaminal ESI (little relief) 07/29/2022: Left L5-S1 transforaminal ESI (50% relief) 03/13/2022: Right L5-S1 transforaminal ESI (4 days of relief and then return of pain) 01/13/2022: Left L5-S1 transforaminal ESI (70% relief of leg pain) 11/19/2021: Right L5 trigger point injection (mild benefit) 05/29/2021: Left L5-S1 transforaminal ESI (good relief for 5.5 months) 11/26/2020: Left L5-S1 transforaminal ESI (good relief for 5.5 months) 10/08/2020: Left L5-S1 transforaminal ESI (good relief) 06/20/2020: Left L5-S1 transforaminal ESI (100% relief X3 months)  05/23/2020: Left L5-S1 transforaminal ESI (good relief) 02/19/2018: Left trochanteric bursa injection performed by [facility] (no relief)   Past Surgery: no previous spine surgery  [name] has no symptoms of cervical myelopathy.  The symptoms are causing a significant impact on the patient's life.   I have utilized the care everywhere function in epic to review the outside records available from external health  systems.  Review of Systems:  A 10 point review of systems is negative, except for the pertinent positives and negatives detailed in the HPI.  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 Al Corpus   MASTECTOMY Right Oct 1997   c Reconstruction surgery on Right Breast   REDUCTION MAMMAPLASTY Left 2012   TUBAL LIGATION      Allergies: Allergies as of 06/24/2023   (No Known Allergies)    Medications:  Current Outpatient Medications:    acetaminophen (TYLENOL) 500 MG tablet, Take 500 mg by mouth every 8 (eight) hours as needed., Disp: , Rfl:    Cholecalciferol (VITAMIN D3) 1.25 MG (50000 UT) CAPS, TAKE 1 CAPSULE BY MOUTH ONE TIME PER WEEK, Disp: 12 capsule, Rfl: 0   gabapentin (NEURONTIN) 100 MG capsule, TAKE 1 CAPSULE BY MOUTH 3 TIMES DAILY. START AT 100 MG AT BEDTIME & GRADUALLY INCREASE TO 3XDAY, Disp: 270 capsule, Rfl: 1   hydrochlorothiazide (HYDRODIURIL) 25 MG tablet, TAKE 1 TABLET BY MOUTH EVERY DAY, Disp: 90 tablet, Rfl: 1   mometasone (ELOCON) 0.1 % cream, Apply 1-2 times daily as needed for rash. Avoid applying to face, groin, and axilla. Use as directed. Long-term use can cause thinning of the skin., Disp: 45 g, Rfl: 1   omeprazole (PRILOSEC) 20 MG capsule, Take 1 capsule (20 mg total) by mouth 2 (two) times daily before a meal., Disp: 180 capsule, Rfl: 1   Red Yeast Rice Extract (RED  YEAST RICE PO), Take by mouth., Disp: , Rfl:    sertraline (ZOLOFT) 50 MG tablet, TAKE 1 TABLET BY MOUTH EVERY DAY, Disp: 90 tablet, Rfl: 1   traMADol (ULTRAM) 50 MG tablet, Take 25-50 mg by mouth 2 (two) times daily as needed., Disp: , Rfl:    traZODone (DESYREL) 100 MG tablet, TAKE 1 TABLET BY MOUTH EVERYDAY AT BEDTIME,  Disp: 90 tablet, Rfl: 1  Social History: Social History   Tobacco Use   Smoking status: Never   Smokeless tobacco: Never  Vaping Use   Vaping status: Never Used  Substance Use Topics   Alcohol use: No    Alcohol/week: 0.0 standard drinks of alcohol   Drug use: No    Family Medical History: Family History  Problem Relation Age of Onset   Diabetes Mother    Hypertension Mother    Stroke Mother    Cancer Father        melonoma   Diabetes Father    Hypertension Father    Parkinsonism Father    Breast cancer Maternal Aunt    Breast cancer Paternal Aunt    Breast cancer Cousin    Hypertension Sister    Arthritis Sister    Stroke Maternal Grandmother    Stroke Paternal Grandmother    Heart attack Paternal Grandfather        before age 10   Hypertension Sister    Arthritis Sister    Breast cancer Cousin     Physical Examination: Vitals:   06/24/23 1056  BP: 114/76    General: Patient is in no apparent distress. Attention to examination is appropriate.  Neck:   Supple.  Full range of motion.  Respiratory: Patient is breathing without any difficulty.   NEUROLOGICAL:     Awake, alert, oriented to person, place, and time.  Speech is clear and fluent.   Cranial Nerves: Pupils equal round and reactive to light.  Facial tone is symmetric.  Facial sensation is symmetric. Shoulder shrug is symmetric. Tongue protrusion is midline.  There is no pronator drift.  Strength: Side Biceps Triceps Deltoid Interossei Grip Wrist Ext. Wrist Flex.  R 5 5 5 5 5 5 5   L 5 5 5 5 5 5 5    Side Iliopsoas Quads Hamstring PF DF EHL  R 5 5 5 5 5 5   L 5 5 5 5 5 5    Reflexes are 1+ and symmetric at the biceps, triceps, brachioradialis, patella and achilles.   Hoffman's is absent.   Bilateral upper and lower extremity sensation is intact to light touch.    No evidence of dysmetria noted.  Gait is normal.     Medical Decision Making  Imaging: MR L spine 02/16/2023 IMPRESSION: 1.  No change in the appearance of the lumbar spine since the prior MRI. 2. Mild narrowing in the subarticular recesses at L3-4 is greater on the right. There is also mild bilateral foraminal narrowing at this level. 3. Mild to moderate foraminal narrowing at L4-5 and L5-S1 is worse on the right at L4-5 and on the left at L5-S1.     Electronically Signed   By: Drusilla Kanner M.D.   On: 02/16/2023 09:16  CT L spine 06/04/2023 L2-3: No impingement.  Mild disc bulge.   L3-4: Suspected mild right subarticular lateral recess stenosis and borderline bilateral foraminal stenosis due to disc bulge, right lateral recess disc protrusion, and facet arthropathy.   L4-5: Mild right foraminal stenosis due to disc osteophyte complex  and facet arthropathy.   L5-S1: Borderline left foraminal stenosis due to facet and intervertebral spurring along with disc bulge.   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   IMPRESSION: 1. Lumbar spondylosis and degenerative disc disease causing mild impingement at L3-4 and L4-5 as noted above. 2. Aortic atherosclerosis. 3. Bilateral punctate nonobstructive nephrolithiasis.   Aortic Atherosclerosis (ICD10-I70.0).     Electronically Signed   By: Gaylyn Rong M.D.   On: 06/10/2023 13:35  I have personally reviewed the images and agree with the above interpretation.  Assessment and Plan: Ms. Rainge is a pleasant 72 y.o. female with significant low back pain with substantial lumbar spondylosis.  She has tried and failed conservative management.  I think it is reasonable for her to consider surgical intervention.  Have not finalize my surgical plan, which would include L2-5 lateral lumbar interbody fusion with either L2-5 or L2-S2 instrumentation posteriorly.  I would like to get flexion-extension x-rays to determine whether extension of the pelvis is a reasonable consideration.  I think her other alternative is consideration of a spinal cord  stimulator.  Have given her information for this and asked her and her husband to think this over.  I will be in contact with them after her x-ray results are back.    Thank you for involving me in the care of this patient.      Yaelis Scharfenberg K. Myer Haff MD, [facility] Neurosurgery

## 2023-06-24 ENCOUNTER — Encounter: Payer: Self-pay | Admitting: Neurosurgery

## 2023-06-24 ENCOUNTER — Ambulatory Visit
Admission: RE | Admit: 2023-06-24 | Discharge: 2023-06-24 | Disposition: A | Discharge status: Home / Self Care | Payer: Medicare Other | Attending: Neurosurgery | Admitting: Neurosurgery

## 2023-06-24 ENCOUNTER — Ambulatory Visit
Admission: RE | Admit: 2023-06-24 | Discharge: 2023-06-24 | Disposition: A | Discharge status: Home / Self Care | Payer: Medicare Other | Source: Ambulatory Visit | Attending: Neurosurgery | Admitting: Neurosurgery

## 2023-06-24 ENCOUNTER — Ambulatory Visit (INDEPENDENT_AMBULATORY_CARE_PROVIDER_SITE_OTHER): Payer: Medicare Other | Admitting: Neurosurgery

## 2023-06-24 VITALS — BP 114/76 | Ht 63.0 in | Wt 162.0 lb

## 2023-06-24 DIAGNOSIS — M4316 Spondylolisthesis, lumbar region: Secondary | ICD-10-CM | POA: Diagnosis present

## 2023-06-24 DIAGNOSIS — G8929 Other chronic pain: Secondary | ICD-10-CM

## 2023-06-24 DIAGNOSIS — M47816 Spondylosis without myelopathy or radiculopathy, lumbar region: Secondary | ICD-10-CM | POA: Diagnosis not present

## 2023-06-24 DIAGNOSIS — M545 Low back pain, unspecified: Secondary | ICD-10-CM

## 2023-06-25 ENCOUNTER — Other Ambulatory Visit: Payer: Self-pay | Admitting: Family Medicine

## 2023-06-25 NOTE — Telephone Encounter (Signed)
Last office visit 12/10/22 for CPE. Last refilled 03/31/23 for #12 with no refills.  Vit D level 12/02/22 low at 24.69 ng/mL   Next Appt:  No future appointments with PCP.

## 2023-06-30 ENCOUNTER — Other Ambulatory Visit: Payer: Self-pay | Admitting: Family Medicine

## 2023-07-08 ENCOUNTER — Encounter: Payer: Self-pay | Admitting: Neurosurgery

## 2023-07-15 ENCOUNTER — Ambulatory Visit: Payer: Medicare Other | Admitting: Neurosurgery

## 2023-07-15 ENCOUNTER — Encounter: Payer: Self-pay | Admitting: Neurosurgery

## 2023-07-15 VITALS — BP 136/76 | Ht 63.0 in | Wt 162.0 lb

## 2023-07-15 DIAGNOSIS — G8929 Other chronic pain: Secondary | ICD-10-CM | POA: Diagnosis not present

## 2023-07-15 DIAGNOSIS — M545 Low back pain, unspecified: Secondary | ICD-10-CM

## 2023-07-15 DIAGNOSIS — M47816 Spondylosis without myelopathy or radiculopathy, lumbar region: Secondary | ICD-10-CM | POA: Diagnosis not present

## 2023-07-15 DIAGNOSIS — M5137 Other intervertebral disc degeneration, lumbosacral region with discogenic back pain only: Secondary | ICD-10-CM

## 2023-07-15 DIAGNOSIS — M4316 Spondylolisthesis, lumbar region: Secondary | ICD-10-CM

## 2023-07-15 DIAGNOSIS — G894 Chronic pain syndrome: Secondary | ICD-10-CM

## 2023-07-15 NOTE — Progress Notes (Signed)
Referring Physician:  Excell Seltzer, MD [address] [place],  [place] [address]  Primary Physician:  Excell Seltzer, MD  History of Present Illness: 07/15/2023 Beth Fox returns to review her imaging.  She continues to have back pain.  06/24/2023 Beth Fox is here today with a chief complaint of significant low back pain that has worsened over the past several years.  She has tried physical therapy for this without improvement.  Standing, walking, twisting, sitting, bending make it worse.  Sometimes she gets pain into her right hip.  She has been evaluated by an outside surgeon who recommended a major surgical intervention.  She is here today for second opinion.  Bowel/Bladder Dysfunction: none  Conservative measures:  Physical therapy:  has participated in PT at [facility] in [place] Multimodal medical therapy including regular antiinflammatories:  tylenol, meloxicam, gabapentin, prednisone taper Tramadol Injections:  has received epidural steroid injections 01/19/2023: Left L5-S1 transforaminal ESI (little relief) 07/29/2022: Left L5-S1 transforaminal ESI (50% relief) 03/13/2022: Right L5-S1 transforaminal ESI (4 days of relief and then return of pain) 01/13/2022: Left L5-S1 transforaminal ESI (70% relief of leg pain) 11/19/2021: Right L5 trigger point injection (mild benefit) 05/29/2021: Left L5-S1 transforaminal ESI (good relief for 5.5 months) 11/26/2020: Left L5-S1 transforaminal ESI (good relief for 5.5 months) 10/08/2020: Left L5-S1 transforaminal ESI (good relief) 06/20/2020: Left L5-S1 transforaminal ESI (100% relief X3 months)  05/23/2020: Left L5-S1 transforaminal ESI (good relief) 02/19/2018: Left trochanteric bursa injection performed by [facility] (no relief)   Past Surgery: no previous spine surgery  Beth Fox has no symptoms of cervical myelopathy.  The symptoms are causing a significant impact on the patient's life.   I have  utilized the care everywhere function in epic to review the outside records available from external health systems.  Review of Systems:  A 10 point review of systems is negative, except for the pertinent positives and negatives detailed in the HPI.  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 Al Corpus   MASTECTOMY Right Oct 1997   c Reconstruction surgery on Right Breast   REDUCTION MAMMAPLASTY Left 2012   TUBAL LIGATION      Allergies: Allergies as of 07/15/2023   (No Known Allergies)    Medications:  Current Outpatient Medications:    acetaminophen (TYLENOL) 500 MG tablet, Take 500 mg by mouth every 8 (eight) hours as needed., Disp: , Rfl:    Cholecalciferol (VITAMIN D3) 1.25 MG (50000 UT) CAPS, TAKE 1 CAPSULE BY MOUTH ONE TIME PER WEEK, Disp: 12 capsule, Rfl: 0   gabapentin (NEURONTIN) 100 MG capsule, TAKE 1 CAPSULE BY MOUTH 3 TIMES DAILY. START AT 100 MG AT BEDTIME & GRADUALLY INCREASE TO 3XDAY, Disp: 270 capsule, Rfl: 1   hydrochlorothiazide (HYDRODIURIL) 25 MG tablet, TAKE 1 TABLET BY MOUTH EVERY DAY, Disp: 90 tablet, Rfl: 1   mometasone (ELOCON) 0.1 % cream, Apply 1-2 times daily as needed for rash. Avoid applying to face, groin, and axilla. Use as directed. Long-term use can cause thinning of the skin., Disp: 45 g, Rfl: 1   omeprazole (PRILOSEC) 20 MG capsule, Take 1 capsule (20 mg  total) by mouth 2 (two) times daily before a meal., Disp: 180 capsule, Rfl: 1   Red Yeast Rice Extract (RED YEAST RICE PO), Take by mouth., Disp: , Rfl:    sertraline (ZOLOFT) 50 MG tablet, TAKE 1 TABLET BY MOUTH EVERY DAY, Disp: 90 tablet, Rfl: 1   traMADol (ULTRAM) 50 MG tablet, Take 25-50 mg by mouth 2 (two) times daily as  needed., Disp: , Rfl:    traZODone (DESYREL) 100 MG tablet, TAKE 1 TABLET BY MOUTH EVERYDAY AT BEDTIME, Disp: 90 tablet, Rfl: 1  Social History: Social History   Tobacco Use   Smoking status: Never   Smokeless tobacco: Never  Vaping Use   Vaping status: Never Used  Substance Use Topics   Alcohol use: No    Alcohol/week: 0.0 standard drinks of alcohol   Drug use: No    Family Medical History: Family History  Problem Relation Age of Onset   Diabetes Mother    Hypertension Mother    Stroke Mother    Cancer Father        melonoma   Diabetes Father    Hypertension Father    Parkinsonism Father    Breast cancer Maternal Aunt    Breast cancer Paternal Aunt    Breast cancer Cousin    Hypertension Sister    Arthritis Sister    Stroke Maternal Grandmother    Stroke Paternal Grandmother    Heart attack Paternal Grandfather        before age 81   Hypertension Sister    Arthritis Sister    Breast cancer Cousin     Physical Examination: Vitals:   07/15/23 1434  BP: 136/76    General: Patient is in no apparent distress. Attention to examination is appropriate.  Neck:   Supple.  Full range of motion.  Respiratory: Patient is breathing without any difficulty.   NEUROLOGICAL:     Awake, alert, oriented to person, place, and time.  Speech is clear and fluent.   Cranial Nerves: Pupils equal round and reactive to light.  Facial tone is symmetric.  Facial sensation is symmetric. Shoulder shrug is symmetric. Tongue protrusion is midline.  There is no pronator drift.  Strength: Side Biceps Triceps Deltoid Interossei Grip Wrist Ext. Wrist Flex.  R 5 5 5 5 5 5 5   L 5 5 5 5 5 5 5    Side Iliopsoas Quads Hamstring PF DF EHL  R 5 5 5 5 5 5   L 5 5 5 5 5 5    Reflexes are 1+ and symmetric at the biceps, triceps, brachioradialis, patella and achilles.   Hoffman's is absent.   Bilateral upper and lower extremity sensation is intact to light touch.    No evidence of dysmetria  noted.  Gait is normal.     Medical Decision Making  Imaging: MR L spine 02/16/2023 IMPRESSION: 1. No change in the appearance of the lumbar spine since the prior MRI. 2. Mild narrowing in the subarticular recesses at L3-4 is greater on the right. There is also mild bilateral foraminal narrowing at this level. 3. Mild to moderate foraminal narrowing at L4-5 and L5-S1 is worse on the right at L4-5 and on the left at L5-S1.     Electronically Signed   By: Drusilla Kanner M.D.   On: 02/16/2023 09:16  CT L spine 06/04/2023 L2-3: No impingement.  Mild disc bulge.   L3-4: Suspected mild right subarticular lateral recess stenosis and borderline bilateral foraminal stenosis due to  disc bulge, right lateral recess disc protrusion, and facet arthropathy.   L4-5: Mild right foraminal stenosis due to disc osteophyte complex and facet arthropathy.   L5-S1: Borderline left foraminal stenosis due to facet and intervertebral spurring along with disc bulge.   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   IMPRESSION: 1. Lumbar spondylosis and degenerative disc disease causing mild impingement at L3-4 and L4-5 as noted above. 2. Aortic atherosclerosis. 3. Bilateral punctate nonobstructive nephrolithiasis.   Aortic Atherosclerosis (ICD10-I70.0).     Electronically Signed   By: Gaylyn Rong M.D.   On: 06/10/2023 13:35  I have personally reviewed the images and agree with the above interpretation.  Assessment and Plan: Beth Fox is a pleasant 72 y.o. female with significant low back pain with substantial lumbar spondylosis.  She has tried and failed conservative management.  I think it is reasonable for her to consider surgical intervention.  We discussed the options.  Based on her x-ray results and her CT results which show that her worst degenerative disc disease between L2 and L5, I would recommend that she consider either L2-L5 lateral lumbar interbody fusion with  posterior fixation or consideration of spinal cord stimulation.  She has thought about this, would like to pursue spinal cord stimulation.  Will refer her for trial, thoracic MRI, and psychology evaluation.  I am hopeful that she will have an excellent trial and I will be happy to see her back afterwards to discuss surgical placement of a spinal cord stimulator.    I spent a total of 10 minutes in this patient's care today. This time was spent reviewing pertinent records including imaging studies, obtaining and confirming history, performing a directed evaluation, formulating and discussing my recommendations, and documenting the visit within the medical record.   Thank you for involving me in the care of this patient.      Amyre Segundo K. Myer Haff MD, [facility] Neurosurgery

## 2023-07-15 NOTE — Patient Instructions (Signed)
[facility] - televisits [phone] Not in network with [facility] - televisits [phone] Not in network with [facility] in [place] [phone] Accepts [facility] Dr Kieth Brightly in [place] [phone] Accepts [facility], but is typically booked out about 10 months Dr Orie Fisherman in [place] - also does televisits [phone] Dr Irish Lack in [place] [phone] Dr Mindi Slicker in [place] - also does televisits [phone] [facility] (offices in [place], [place], and [place]) [phone]   Please let us know which one of the above psychologist's you would like to see for evaluation prior to the spinal cord stimulator trial. These are the only providers we are aware of that perform this type of evaluation. Once we fax the referral, please call them to set up an appointment (they do not typically call you).

## 2023-07-22 ENCOUNTER — Other Ambulatory Visit: Payer: Self-pay | Admitting: Family Medicine

## 2023-07-27 ENCOUNTER — Ambulatory Visit: Payer: Medicare Other | Admitting: Student in an Organized Health Care Education/Training Program

## 2023-07-27 ENCOUNTER — Ambulatory Visit
Admission: RE | Admit: 2023-07-27 | Discharge: 2023-07-27 | Disposition: A | Discharge status: Home / Self Care | Payer: Medicare Other | Source: Ambulatory Visit | Attending: Student in an Organized Health Care Education/Training Program | Admitting: Student in an Organized Health Care Education/Training Program

## 2023-07-27 ENCOUNTER — Ambulatory Visit
Admission: RE | Admit: 2023-07-27 | Discharge: 2023-07-27 | Disposition: A | Discharge status: Home / Self Care | Payer: Medicare Other | Source: Ambulatory Visit | Attending: Student in an Organized Health Care Education/Training Program | Admitting: *Deleted

## 2023-07-27 ENCOUNTER — Encounter: Payer: Self-pay | Admitting: Student in an Organized Health Care Education/Training Program

## 2023-07-27 VITALS — BP 126/76 | HR 83 | Temp 96.9°F | Resp 16 | Ht 63.0 in | Wt 160.0 lb

## 2023-07-27 DIAGNOSIS — G894 Chronic pain syndrome: Secondary | ICD-10-CM | POA: Insufficient documentation

## 2023-07-27 DIAGNOSIS — M461 Sacroiliitis, not elsewhere classified: Secondary | ICD-10-CM | POA: Insufficient documentation

## 2023-07-27 DIAGNOSIS — M5136 Other intervertebral disc degeneration, lumbar region with discogenic back pain only: Secondary | ICD-10-CM | POA: Insufficient documentation

## 2023-07-27 DIAGNOSIS — G5703 Lesion of sciatic nerve, bilateral lower limbs: Secondary | ICD-10-CM | POA: Insufficient documentation

## 2023-07-27 DIAGNOSIS — M47816 Spondylosis without myelopathy or radiculopathy, lumbar region: Secondary | ICD-10-CM | POA: Insufficient documentation

## 2023-07-27 NOTE — Progress Notes (Signed)
Patient: Beth Fox  Service Category: E/M  Provider: Edward Jolly, MD  DOB: [birth year]  DOS: 07/27/2023  Referring Provider: Venetia Night, MD  MRN: [id number]  Setting: Ambulatory outpatient  PCP: Excell Seltzer, MD  Type: New Patient  Specialty: Interventional Pain Management    Location: Office  Delivery: Face-to-face     Primary Reason(s) for Visit: Encounter for initial evaluation of one or more chronic problems (new to examiner) potentially causing chronic pain, and posing a threat to normal musculoskeletal function. (Level of risk: High) CC: Back Pain  HPI  Beth Fox is a 72 y.o. year old, female patient, who comes for the first time to our practice referred by Venetia Night, MD for our initial evaluation of her chronic pain. She has History of cancer of right breast; HYPERCHOLESTEROLEMIA; INSOMNIA, CHRONIC; MDD (major depressive disorder), recurrent episode, moderate (HCC); Migraine without aura; Benign essential hypertension; GERD; CONSTIPATION, CHRONIC; DEGENERATIVE DISC DISEASE, CERVICAL SPINE; Sleep apnea; URINARY INCONTINENCE, OVERFLOW; PREDIABETES; RESTLESS LEG SYNDROME; Allergic rhinitis; Neck pain, chronic; Other fatigue; Osteopenia; Vitamin D deficiency; Right ovarian cyst, need 2020 re-eval [place]; Fatty liver; Bladder wall thickening; Peptic stricture of esophagus; Lightheaded; Lumbar facet arthropathy; Degeneration of intervertebral disc of lumbar region with discogenic back pain; SI joint arthritis (HCC); Piriformis syndrome of both sides; and Chronic pain syndrome on their problem list. Today she comes in for evaluation of her Back Pain  Pain Assessment: Location: Lower Back Radiating: always to RIGHT hip; to LEFT hip only when walking Onset: More than a month ago Duration: Chronic pain Quality: Sharp, Shooting Severity: 8 /10 (subjective, self-reported pain score)  Effect on ADL: limits daily activities Timing: Constant Modifying factors: meds make pain  tolerable BP: 126/76  HR: 83  Onset and Duration: Gradual and Present longer than 3 months Cause of pain: Unknown Severity: Getting worse, NAS-11 at its worse: 10/10, NAS-11 at its best: 0/10, NAS-11 now: 8/10, and NAS-11 on the average: 6/10 Timing: Afternoon and During activity or exercise Aggravating Factors: Bending, Bowel movements, Kneeling, Lifiting, Motion, Prolonged sitting, Prolonged standing, Twisting, and Walking Alleviating Factors: Hot packs and Lying down Associated Problems: Constipation, Inability to concentrate, and Personality changes Quality of Pain: Constant, Nagging, Sharp, Stabbing, and Uncomfortable Previous Examinations or Tests: CT scan, MRI scan, and X-rays Previous Treatments: Narcotic medications, Physical Therapy, and Strengthening exercises  Beth Fox is being evaluated for possible interventional pain management therapies for the treatment of her chronic pain.   Beth Fox is a pleasant 72 year old female who presents with a chief complaint of axial low back pain.  She has done physical therapy in the past without improvement.  She states that stooping, bending forward, twisting makes it worse.  She does have some pain radiation into her right hip and buttock.  Of note she has had multiple lumbar epidural steroid injections, see procedural history below.  She has been evaluated by Dr. Marcell Barlow with neurosurgery and is being sent here to consider stimulation therapies for her lumbar spine.  Procedures with Dr Yves Dill PM&R: 01/19/2023: Left L5-S1 transforaminal ESI (little relief) 07/29/2022: Left L5-S1 transforaminal ESI (50% relief) 03/13/2022: Right L5-S1 transforaminal ESI (4 days of relief and then return of pain) 01/13/2022: Left L5-S1 transforaminal ESI (70% relief of leg pain) 11/19/2021: Right L5 trigger point injection (mild benefit) 05/29/2021: Left L5-S1 transforaminal ESI (good relief for 5.5 months) 11/26/2020: Left L5-S1 transforaminal ESI  (good relief for 5.5 months) 10/08/2020: Left L5-S1 transforaminal ESI (good relief) 06/20/2020: Left L5-S1 transforaminal ESI (100% relief X3  months)  05/23/2020: Left L5-S1 transforaminal ESI (good relief) 02/19/2018: Left trochanteric bursa injection performed by [facility] (no relief)    Meds   Current Outpatient Medications:    acetaminophen (TYLENOL) 500 MG tablet, Take 500 mg by mouth every 8 (eight) hours as needed., Disp: , Rfl:    Cholecalciferol (VITAMIN D3) 1.25 MG (50000 UT) CAPS, TAKE 1 CAPSULE BY MOUTH ONE TIME PER WEEK, Disp: 12 capsule, Rfl: 0   gabapentin (NEURONTIN) 100 MG capsule, TAKE 1 CAPSULE BY MOUTH 3 TIMES DAILY. START AT 100 MG AT BEDTIME & GRADUALLY INCREASE TO 3XDAY (Patient taking differently: Pt takes 100 mg AM and 300 mg HS), Disp: 270 capsule, Rfl: 1   hydrochlorothiazide (HYDRODIURIL) 25 MG tablet, TAKE 1 TABLET BY MOUTH EVERY DAY, Disp: 90 tablet, Rfl: 1   mometasone (ELOCON) 0.1 % cream, Apply 1-2 times daily as needed for rash. Avoid applying to face, groin, and axilla. Use as directed. Long-term use can cause thinning of the skin., Disp: 45 g, Rfl: 1   omeprazole (PRILOSEC) 20 MG capsule, Take 1 capsule (20 mg total) by mouth 2 (two) times daily before a meal., Disp: 180 capsule, Rfl: 1   Red Yeast Rice Extract (RED YEAST RICE PO), Take by mouth., Disp: , Rfl:    sertraline (ZOLOFT) 50 MG tablet, TAKE 1 TABLET BY MOUTH EVERY DAY, Disp: 90 tablet, Rfl: 1   traMADol (ULTRAM) 50 MG tablet, Take 25-50 mg by mouth 2 (two) times daily as needed., Disp: , Rfl:    traZODone (DESYREL) 100 MG tablet, TAKE 1 TABLET BY MOUTH EVERYDAY AT BEDTIME, Disp: 90 tablet, Rfl: 1  Imaging Review   MR LUMBAR SPINE WO CONTRAST  Narrative CLINICAL DATA:  Chronic low back and bilateral hip pain.  EXAM: MRI LUMBAR SPINE WITHOUT CONTRAST  TECHNIQUE: Multiplanar, multisequence MR imaging of the lumbar spine was performed. No intravenous contrast was  administered.  COMPARISON:  MRI lumbar spine 04/13/2022. Plain films lumbar spine 03/06/2010.  FINDINGS: Segmentation:  Standard.  Alignment: Maintained. Mild convex left curvature lower lumbar spine again seen.  Vertebrae: No fracture, evidence of discitis, or bone lesion. Mild degenerative endplate signal change anteriorly at L3-4 and posteriorly at L5-S1 is again seen. Tarlov cysts in the sacrum are unchanged.  Conus medullaris and cauda equina: Conus extends to the L1-2 level. Conus and cauda equina appear normal.  Paraspinal and other soft tissues: Bilateral renal cysts are unchanged.  Disc levels:  T11-12 is imaged in the sagittal plane only and negative.  T12-L1: Negative.  L1-2: Negative.  L2-3: Shallow central and left paracentral protrusion without stenosis, unchanged.  L3-4: Shallow disc bulge and a right subarticular recess protrusion again seen. Mild narrowing in the subarticular recesses is greater on the right. There is also mild bilateral foraminal narrowing. No change.  L4-5: Shallow disc bulge and endplate spur, more prominent to the right. The central canal is widely patent. Mild to moderate foraminal narrowing is worse on the right. No change.  L5-S1: Shallow disc bulge eccentric to the left. The central canal is open. Mild to moderate foraminal narrowing is worse on the left. No change.  IMPRESSION: 1. No change in the appearance of the lumbar spine since the prior MRI. 2. Mild narrowing in the subarticular recesses at L3-4 is greater on the right. There is also mild bilateral foraminal narrowing at this level. 3. Mild to moderate foraminal narrowing at L4-5 and L5-S1 is worse on the right at L4-5 and on the left at  L5-S1.   Electronically Signed By: Drusilla Kanner M.D. On: 02/16/2023 09:16    CT LUMBAR SPINE WO CONTRAST  Narrative CLINICAL DATA:  Spondylosis and neurogenic claudication, bilateral sciatica.  EXAM: CT LUMBAR  SPINE WITHOUT CONTRAST  TECHNIQUE: Multidetector CT imaging of the lumbar spine was performed without intravenous contrast administration. Multiplanar CT image reconstructions were also generated.  RADIATION DOSE REDUCTION: This exam was performed according to the departmental dose-optimization program which includes automated exposure control, adjustment of the mA and/or kV according to patient size and/or use of iterative reconstruction technique.  COMPARISON:  MRI 02/16/2023  FINDINGS: Segmentation: The lowest lumbar type non-rib-bearing vertebra is labeled as L5.  Alignment: 2 mm degenerative retrolisthesis of L3 on L4.  Vertebrae: Disc desiccation loss of disc height at all levels between L2 and S1. The same levels demonstrate mild endplate sclerosis. No fracture or acute bony findings.  Paraspinal and other soft tissues: Atherosclerosis is present, including aortoiliac atherosclerotic disease. Bilateral punctate nonobstructive nephrolithiasis. Fluid density cyst in the left mid kidney warranting no further imaging workup.  Disc levels: No significant findings above L2-3.  L2-3: No impingement.  Mild disc bulge.  L3-4: Suspected mild right subarticular lateral recess stenosis and borderline bilateral foraminal stenosis due to disc bulge, right lateral recess disc protrusion, and facet arthropathy.  L4-5: Mild right foraminal stenosis due to disc osteophyte complex and facet arthropathy.  L5-S1: Borderline left foraminal stenosis due to facet and intervertebral spurring along with disc bulge.  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  IMPRESSION: 1. Lumbar spondylosis and degenerative disc disease causing mild impingement at L3-4 and L4-5 as noted above. 2. Aortic atherosclerosis. 3. Bilateral punctate nonobstructive nephrolithiasis.  Aortic Atherosclerosis (ICD10-I70.0).   Electronically Signed By: Gaylyn Rong M.D. On: 06/10/2023  13:35   Narrative CLINICAL DATA:  Back and leg pain.  EXAM: LUMBAR SPINE - COMPLETE 4+ VIEW  COMPARISON:  03/06/2010.  FINDINGS: No fracture or bone lesion.  Minimal, grade 1, retrolisthesis of L3 on L4. Mild levoscoliosis, apex at L4.  Moderate loss of disc height at L2-L3, L3-L4 and L4-L5 with mild loss of disc height at L5-S1.  No subluxation with flexion or extension.  Surgical vascular clips are noted in the upper abdomen and left mid to lower abdomen. Soft tissues otherwise unremarkable.  IMPRESSION: 1. Degenerative changes as detailed, as well as a levoscoliosis, findings new since the 03/06/2010 exam. 2. No fracture or acute finding. 3. No subluxation with flexion or extension.   Electronically Signed By: Amie Portland M.D. On: 07/08/2023 15:18   Complexity Note: Imaging results reviewed.                         ROS  Cardiovascular: No reported cardiovascular signs or symptoms such as High blood pressure, coronary artery disease, abnormal heart rate or rhythm, heart attack, blood thinner therapy or heart weakness and/or failure Pulmonary or Respiratory: Temporary stoppage of breathing during sleep Neurological: No reported neurological signs or symptoms such as seizures, abnormal skin sensations, urinary and/or fecal incontinence, being born with an abnormal open spine and/or a tethered spinal cord Psychological-Psychiatric: Depressed Gastrointestinal: Heartburn due to stomach pushing into lungs (Hiatal hernia) and Reflux or heatburn Genitourinary: No reported renal or genitourinary signs or symptoms such as difficulty voiding or producing urine, peeing blood, non-functioning kidney, kidney stones, difficulty emptying the bladder, difficulty controlling the flow of urine, or chronic kidney disease Hematological: No reported hematological signs  or symptoms such as prolonged bleeding, low or poor functioning platelets, bruising or bleeding easily, hereditary  bleeding problems, low energy levels due to low hemoglobin or being anemic Endocrine: No reported endocrine signs or symptoms such as high or low blood sugar, rapid heart rate due to high thyroid levels, obesity or weight gain due to slow thyroid or thyroid disease Rheumatologic: No reported rheumatological signs and symptoms such as fatigue, joint pain, tenderness, swelling, redness, heat, stiffness, decreased range of motion, with or without associated rash Musculoskeletal: Negative for myasthenia gravis, muscular dystrophy, multiple sclerosis or malignant hyperthermia Work History: Retired  Allergies  Beth Fox has No Known Allergies.  Laboratory Chemistry Profile   Renal Lab Results  Component Value Date   BUN 21 12/02/2022   CREATININE 1.11 12/02/2022   GFR 50.29 (L) 12/02/2022   GFRNONAA 77.07 08/27/2010   SPECGRAV 1.025 06/13/2019   PHUR 5.5 06/13/2019   PROTEINUR Positive (A) 06/13/2019     Electrolytes Lab Results  Component Value Date   NA 142 12/02/2022   K 3.8 12/02/2022   CL 100 12/02/2022   CALCIUM 10.1 12/02/2022     Hepatic Lab Results  Component Value Date   AST 15 12/02/2022   ALT 15 12/02/2022   ALBUMIN 4.3 12/02/2022   ALKPHOS 88 12/02/2022   LIPASE 26.0 02/09/2017     ID Lab Results  Component Value Date   HCVAB NEGATIVE 08/02/2015     Bone Lab Results  Component Value Date   VD25OH 24.69 (L) 12/02/2022     Endocrine Lab Results  Component Value Date   GLUCOSE 100 (H) 12/02/2022   HGBA1C 5.8 12/02/2022   TSH 0.46 07/08/2022     Neuropathy Lab Results  Component Value Date   VITAMINB12 266 07/08/2022   HGBA1C 5.8 12/02/2022     CNS No results found for: "COLORCSF", "APPEARCSF", "RBCCOUNTCSF", "WBCCSF", "POLYSCSF", "LYMPHSCSF", "EOSCSF", "PROTEINCSF", "GLUCCSF", "JCVIRUS", "CSFOLI", "IGGCSF", "LABACHR", "ACETBL"   Inflammation (CRP: Acute  ESR: Chronic) Lab Results  Component Value Date   ESRSEDRATE 30 (H) 01/16/2014      Rheumatology Lab Results  Component Value Date   RF <10 01/16/2014   ANA NEG 01/16/2014     Coagulation Lab Results  Component Value Date   PLT 247.0 07/08/2022     Cardiovascular Lab Results  Component Value Date   HGB 13.3 07/08/2022   HCT 39.6 07/08/2022     Screening Lab Results  Component Value Date   HCVAB NEGATIVE 08/02/2015     Cancer No results found for: "CEA", "CA125", "LABCA2"   Allergens No results found for: "ALMOND", "APPLE", "ASPARAGUS", "AVOCADO", "BANANA", "BARLEY", "BASIL", "BAYLEAF", "GREENBEAN", "LIMABEAN", "WHITEBEAN", "BEEFIGE", "REDBEET", "BLUEBERRY", "BROCCOLI", "CABBAGE", "MELON", "CARROT", "CASEIN", "CASHEWNUT", "CAULIFLOWER", "CELERY"     Note: Lab results reviewed.  PFSH  Drug: Beth Fox  reports no history of drug use. Alcohol:  reports no history of alcohol use. Tobacco:  reports that she has never smoked. She has never used smokeless tobacco. 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 Family: family history includes Arthritis in her sister and sister; Breast cancer in her cousin, cousin, maternal aunt, and paternal aunt; Cancer in her father; Diabetes in her father and mother; Heart attack in her paternal grandfather; Hypertension in her father, mother, sister, and sister; Parkinsonism in her father; Stroke in her maternal grandmother, mother, and paternal grandmother.  Past Surgical History:  Procedure Laterality Date   APPENDECTOMY  BREAST SURGERY     mastectomy on right    FOOT SURGERY Left 06/2018   Dr. Al Corpus   MASTECTOMY Right Oct 1997   c Reconstruction surgery on Right Breast   REDUCTION MAMMAPLASTY Left 2012   TUBAL LIGATION     Active Ambulatory Problems    Diagnosis Date Noted   History of cancer of right breast 10/13/1995   HYPERCHOLESTEROLEMIA 03/19/2009   INSOMNIA, CHRONIC 01/02/2009   MDD (major depressive disorder), recurrent episode, moderate  (HCC) 01/02/2009   Migraine without aura 01/02/2009   Benign essential hypertension 01/02/2009   GERD 01/02/2009   CONSTIPATION, CHRONIC 01/02/2009   DEGENERATIVE DISC DISEASE, CERVICAL SPINE 01/02/2009   Sleep apnea 05/16/2010   URINARY INCONTINENCE, OVERFLOW 03/19/2009   PREDIABETES 03/19/2009   RESTLESS LEG SYNDROME 09/09/2010   Allergic rhinitis 10/01/2011   Neck pain, chronic 08/29/2013   Other fatigue 01/16/2014   Osteopenia 08/21/2015   Vitamin D deficiency 08/12/2017   Right ovarian cyst, need 2020 re-eval [place] 10/22/2017   Fatty liver 06/15/2019   Bladder wall thickening 06/16/2019   Peptic stricture of esophagus 10/22/2020   Lightheaded 07/08/2022   Lumbar facet arthropathy 07/27/2023   Degeneration of intervertebral disc of lumbar region with discogenic back pain 07/27/2023   SI joint arthritis (HCC) 07/27/2023   Piriformis syndrome of both sides 07/27/2023   Chronic pain syndrome 07/27/2023   Resolved Ambulatory Problems    Diagnosis Date Noted   Abdominal pain, generalized 12/05/2010   Left breast mass 06/12/2011   Plantar fasciitis 06/12/2011   Rash and nonspecific skin eruption 05/05/2013   Joint pain 01/16/2014   Skin lesion of scalp 01/16/2014   Nasal congestion 11/27/2014   Lumbar paraspinal muscle spasm 04/23/2015   Acute epigastric pain 02/09/2017   Cough 05/16/2017   Intermittent left lower quadrant abdominal pain 10/19/2017   Right leg numbness 05/03/2018   Low back pain 05/03/2018   Acute respiratory infection 10/14/2018   Past Medical History:  Diagnosis Date   Cancer (HCC) 1997 rt side   Personal history of chemotherapy    Constitutional Exam  General appearance: Well nourished, well developed, and well hydrated. In no apparent acute distress Vitals:   07/27/23 1255  BP: 126/76  Pulse: 83  Resp: 16  Temp: (!) 96.9 F (36.1 C)  TempSrc: Temporal  SpO2: 97%  Weight: 160 lb (72.6 kg)  Height: 5\' 3"  (1.6 m)   BMI Assessment: Estimated  body mass index is 28.34 kg/m as calculated from the following:   Height as of this encounter: 5\' 3"  (1.6 m).   Weight as of this encounter: 160 lb (72.6 kg).  BMI interpretation table: BMI level Category Range association with higher incidence of chronic pain  <18 kg/m2 Underweight   18.5-24.9 kg/m2 Ideal body weight   25-29.9 kg/m2 Overweight Increased incidence by 20%  30-34.9 kg/m2 Obese (Class I) Increased incidence by 68%  35-39.9 kg/m2 Severe obesity (Class II) Increased incidence by 136%  >40 kg/m2 Extreme obesity (Class III) Increased incidence by 254%   Patient's current BMI Ideal Body weight  Body mass index is 28.34 kg/m. Ideal body weight: 52.4 kg (115 lb 8.3 oz) Adjusted ideal body weight: 60.5 kg (133 lb 5 oz)   BMI Readings from Last 4 Encounters:  07/27/23 28.34 kg/m  07/15/23 28.70 kg/m  06/24/23 28.70 kg/m  12/10/22 27.57 kg/m   Wt Readings from Last 4 Encounters:  07/27/23 160 lb (72.6 kg)  07/15/23 162 lb (73.5 kg)  06/24/23 162  lb (73.5 kg)  12/10/22 158 lb 2 oz (71.7 kg)    Psych/Mental status: Alert, oriented x 3 (person, place, & time)       Eyes: PERLA Respiratory: No evidence of acute respiratory distress  Thoracic Spine Area Exam  Skin & Axial Inspection: No masses, redness, or swelling Alignment: Symmetrical Functional ROM: Unrestricted ROM Stability: No instability detected Muscle Tone/Strength: Functionally intact. No obvious neuro-muscular anomalies detected. Sensory (Neurological): Unimpaired Muscle strength & Tone: No palpable anomalies  Lumbar Spine Area Exam  Skin & Axial Inspection: No masses, redness, or swelling Alignment: Symmetrical Functional ROM: Pain restricted ROM affecting both sides Stability: No instability detected Muscle Tone/Strength: Functionally intact. No obvious neuro-muscular anomalies detected. Sensory (Neurological): Musculoskeletal pain pattern Palpation: Complains of area being tender to palpation        Provocative Tests: Hyperextension/rotation test: (+) bilaterally for facet joint pain. Lumbar quadrant test (Kemp's test): (+) bilaterally for facet joint pain.   Gait & Posture Assessment  Ambulation: Unassisted Gait: Relatively normal for age and body habitus Posture: WNL  Lower Extremity Exam    Side: Right lower extremity  Side: Left lower extremity  Stability: No instability observed          Stability: No instability observed          Skin & Extremity Inspection: Skin color, temperature, and hair growth are WNL. No peripheral edema or cyanosis. No masses, redness, swelling, asymmetry, or associated skin lesions. No contractures.  Skin & Extremity Inspection: Skin color, temperature, and hair growth are WNL. No peripheral edema or cyanosis. No masses, redness, swelling, asymmetry, or associated skin lesions. No contractures.  Functional ROM: Unrestricted ROM                  Functional ROM: Unrestricted ROM                  Muscle Tone/Strength: Functionally intact. No obvious neuro-muscular anomalies detected.  Muscle Tone/Strength: Functionally intact. No obvious neuro-muscular anomalies detected.  Sensory (Neurological): Unimpaired        Sensory (Neurological): Unimpaired        DTR: Patellar: deferred today Achilles: deferred today Plantar: deferred today  DTR: Patellar: deferred today Achilles: deferred today Plantar: deferred today  Palpation: No palpable anomalies  Palpation: No palpable anomalies    Assessment  Primary Diagnosis & Pertinent Problem List: The primary encounter diagnosis was Lumbar facet arthropathy. Diagnoses of Degeneration of intervertebral disc of lumbar region with discogenic back pain, SI joint arthritis (HCC), Piriformis syndrome of both sides, and Chronic pain syndrome were also pertinent to this visit.  Visit Diagnosis (New problems to examiner): 1. Lumbar facet arthropathy   2. Degeneration of intervertebral disc of lumbar region with  discogenic back pain   3. SI joint arthritis (HCC)   4. Piriformis syndrome of both sides   5. Chronic pain syndrome    Plan of Care (Initial workup plan)  I discussed various treatment options with the patient.  Given that her pain is primarily axial in her lumbar spine, radiographic evidence of lumbar spondylosis we discussed lumbar medial branch nerve blocks, diagnostic, Sprint peripheral nerve stimulation as well as spinal cord stimulation.  Beth Fox has a history of greater than 3 months of moderate to severe pain which is resulted in functional impairment.  The patient has tried various conservative therapeutic options such as NSAIDs, Tylenol, muscle relaxants, physical therapy which was inadequately effective.  Patient's pain is predominantly axial with physical exam and  radiographic findings suggestive of facet arthropathy.  Lumbar facet medial branch nerve blocks were discussed with the patient.  Risks and benefits were reviewed.  Patient would like to proceed with bilateral L3, L4, L5 medial branch nerve block.  Depending upon how she responds to this and duration of response, we discussed subsequent lumbar radiofrequency ablation versus Sprint peripheral nerve stimulation versus spinal cord stimulation.  I explained to her how Sprint peripheral nerve stimulation (PNS)  is typically considered for patients with chronic, localized pain that is not responding to conservative treatments such as medications, physical therapy, or injections.  The SPRINT peripheral nerve stimulator is designed for short-term, percutaneous use (approximately 60 days) to modulate pain through targeted nerve stimulation. Unlike traditional permanent implants, SPRINT is temporary but can lead to long-lasting pain relief by altering pain signals.  We discussed the risks and benefits of peripheral nerve stimulation. Benefits: minimally invasive, does not require permanent implantation, can offer significant  pain relief, improving function and quality of life, may reduce the need for long-term opioid use. The risks/challenges include (but not limited to):  infection or irritation at the stimulation site, discomfort from electrode placement, risk of incomplete pain relief or temporary relief post-removal, limited to short-term therapy, which may be a disadvantage in chronic, refractory cases  I explained to her how a spinal cord stimulation (SCS) is indicated for chronic, intractable pain, especially in cases of failed back surgery syndrome, radiculopathy, or pain syndromes that affect broader areas such as the back and legs.  SCS devices provide continuous electrical impulses to the spinal cord, modulating pain signals before they reach the brain.  We discussed the potential benefits and risks of spinal cord stimulation. Benefits: can provide substantial relief from chronic pain, long-term therapy with programmable settings, often reduces the need for oral medications, including opioids, some patients experience reduced dependency on other pain interventions. Risks (including but not limited to): Surgical risks, including infection, lead migration/fracture, and hardware malfunction, long-term implantation requires ongoing maintenance and possible reoperation, variable effectiveness: some patients do not experience sufficient pain relief.  For her buttock pain, I would like to evaluate her SI joints.  Patient would like to proceed with diagnostic lumbar facet medial branch nerve blocks first.  Imaging Orders         DG Si Joints      Procedure Orders         LUMBAR FACET(MEDIAL BRANCH NERVE BLOCK) MBNB     Provider-requested follow-up: Return in about 4 weeks (around 08/24/2023) for B/L L3, 4, 5 MBNB #1, in clinic IV Versed.  Future Appointments  Date Time Provider Department Center  07/31/2023  6:30 AM [facility] MR 3 [facility]  12/15/2023 10:15 AM [facility] ANNUAL WELLNESS VISIT 1 [facility]     Duration of encounter: .  Total time on encounter, as per AMA guidelines included both the face-to-face and non-face-to-face time personally spent by the physician and/or other qualified health care professional(s) on the day of the encounter (includes time in activities that require the physician or other qualified health care professional and does not include time in activities normally performed by clinical staff). Physician's time may include the following activities when performed: Preparing to see the patient (e.g., pre-charting review of records, searching for previously ordered imaging, lab work, and nerve conduction tests) Review of prior analgesic pharmacotherapies. Reviewing PMP Interpreting ordered tests (e.g., lab work, imaging, nerve conduction tests) Performing post-procedure evaluations, including interpretation of diagnostic procedures Obtaining and/or reviewing separately  obtained history Performing a medically appropriate examination and/or evaluation Counseling and educating the patient/family/caregiver Ordering medications, tests, or procedures Referring and communicating with other health care professionals (when not separately reported) Documenting clinical information in the electronic or other health record Independently interpreting results (not separately reported) and communicating results to the patient/ family/caregiver Care coordination (not separately reported)  Note by: Edward Jolly, MD (TTS technology used. I apologize for any typographical errors that were not detected and corrected.) Date: 07/27/2023; Time: 2:17 PM

## 2023-07-27 NOTE — Patient Instructions (Addendum)
Preparing for Procedure with Sedation Instructions: . Oral Intake: Do not eat or drink anything for at least 8 hours prior to your procedure. . Transportation: Public transportation is not allowed. Bring an adult driver. The driver must be physically present in our waiting room before any procedure can be started. Mar[facility] Kitchen Physical Assistance: Bring an adult capable of physically assisting you, in the event you need help. . Blood Pressure Medicine: Take your blood pressure medicine with a sip of water the morning of the procedure. . Insulin: Take only  of your normal insulin dose. . Preventing infections: Shower with an antibacterial soap the morning of your procedure. . Build-up your immune system: Take 1000 mg of Vitamin C with every meal (3 times a day) the day prior to your procedure. . Pregnancy: If you are pregnant, call and cancel the procedure. . Sickness: If you have a cold, fever, or any active infections, call and cancel the procedure. . Arrival: You must be in the facility at least 30 minutes prior to your scheduled procedure. . Children: Do not bring children with you. . Dress appropriately: Bring dark clothing that you would not mind if they get stained. . Valuables: Do not bring any jewelry or valuables. Procedure appointments are reserved for interventional treatments only. Marland Kitchen No Prescription Refills. . No medication changes will be discussed during procedure appointments. No disability issues will be discussed.Facet Blocks Patient Information  Description: The facets are joints in the spine between the vertebrae.  Like any joints in the body, facets can become irritated and painful.  Arthritis can also effect the facets.  By injecting steroids and local anesthetic in and around these joints, we can temporarily block the nerve supply to them.  Steroids act directly on irritated nerves and tissues to reduce selling and inflammation which often leads to decreased pain.  Facet blocks may  be done anywhere along the spine from the neck to the low back depending upon the location of your pain.   After numbing the skin with local anesthetic (like Novocaine), a small needle is passed onto the facet joints under x-ray guidance.  You may experience a sensation of pressure while this is being done.  The entire block usually lasts about 15-25 minutes.   Conditions which may be treated by facet blocks:  Low back/buttock pain Neck/shoulder pain Certain types of headaches  Preparation for the injection:  Do not eat any solid food or dairy products within 8 hours of your appointment. You may drink clear liquid up to 3 hours before appointment.  Clear liquids include water, black coffee, juice or soda.  No milk or cream please. You may take your regular medication, including pain medications, with a sip of water before your appointment.  Diabetics should hold regular insulin (if taken separately) and take 1/2 normal NPH dose the morning of the procedure.  Carry some sugar containing items with you to your appointment. A driver must accompany you and be prepared to drive you home after your procedure. Bring all your current medications with you. An IV may be inserted and sedation may be given at the discretion of the physician. A blood pressure cuff, EKG and other monitors will often be applied during the procedure.  Some patients may need to have extra oxygen administered for a short period. You will be asked to provide medical information, including your allergies and medications, prior to the procedure.  We must know immediately if you are taking blood thinners (like Coumadin/Warfarin) or if  you are allergic to IV iodine contrast (dye).  We must know if you could possible be pregnant.  Possible side-effects:  Bleeding from needle site Infection (rare, may require surgery) Nerve injury (rare) Numbness & tingling (temporary) Difficulty urinating (rare, temporary) Spinal headache (a  headache worse with upright posture) Light-headedness (temporary) Pain at injection site (serveral days) Decreased blood pressure (rare, temporary) Weakness in arm/leg (temporary) Pressure sensation in back/neck (temporary)   Call if you experience:  Fever/chills associated with headache or increased back/neck pain Headache worsened by an upright position New onset, weakness or numbness of an extremity below the injection site Hives or difficulty breathing (go to the emergency room) Inflammation or drainage at the injection site(s) Severe back/neck pain greater than usual New symptoms which are concerning to you  Please note:  Although the local anesthetic injected can often make your back or neck feel good for several hours after the injection, the pain will likely return. It takes 3-7 days for steroids to work.  You may not notice any pain relief for at least one week.  If effective, we will often do a series of 2-3 injections spaced 3-6 weeks apart to maximally decrease your pain.  After the initial series, you may be a candidate for a more permanent nerve block of the facets.  If you have any questions, please call [phone] . [facility]

## 2023-07-30 ENCOUNTER — Ambulatory Visit
Admission: RE | Admit: 2023-07-30 | Discharge: 2023-07-30 | Disposition: A | Discharge status: Home / Self Care | Payer: Medicare Other | Source: Ambulatory Visit | Attending: Neurosurgery | Admitting: Neurosurgery

## 2023-07-30 DIAGNOSIS — G894 Chronic pain syndrome: Secondary | ICD-10-CM

## 2023-07-30 DIAGNOSIS — M4316 Spondylolisthesis, lumbar region: Secondary | ICD-10-CM

## 2023-07-30 DIAGNOSIS — G8929 Other chronic pain: Secondary | ICD-10-CM

## 2023-07-31 ENCOUNTER — Other Ambulatory Visit: Payer: Medicare Other

## 2023-08-18 ENCOUNTER — Other Ambulatory Visit: Payer: Self-pay | Admitting: Family Medicine

## 2023-08-25 ENCOUNTER — Ambulatory Visit
Admission: RE | Admit: 2023-08-25 | Discharge: 2023-08-25 | Disposition: A | Discharge status: Home / Self Care | Payer: Medicare Other | Source: Ambulatory Visit | Attending: Student in an Organized Health Care Education/Training Program | Admitting: Student in an Organized Health Care Education/Training Program

## 2023-08-25 ENCOUNTER — Ambulatory Visit
Payer: Medicare Other | Attending: Student in an Organized Health Care Education/Training Program | Admitting: Student in an Organized Health Care Education/Training Program

## 2023-08-25 ENCOUNTER — Encounter: Payer: Self-pay | Admitting: Student in an Organized Health Care Education/Training Program

## 2023-08-25 DIAGNOSIS — G894 Chronic pain syndrome: Secondary | ICD-10-CM | POA: Insufficient documentation

## 2023-08-25 DIAGNOSIS — M47816 Spondylosis without myelopathy or radiculopathy, lumbar region: Secondary | ICD-10-CM | POA: Diagnosis present

## 2023-08-25 MED ORDER — LACTATED RINGERS IV SOLN: Freq: Once | INTRAVENOUS | Status: AC

## 2023-08-25 MED ORDER — DEXAMETHASONE SODIUM PHOSPHATE 10 MG/ML IJ SOLN
10.0000 mg | Freq: Once | INTRAMUSCULAR | Status: AC
  Administered 2023-08-25: 10 mg
  Filled 2023-08-25: qty 1

## 2023-08-25 MED ORDER — ROPIVACAINE HCL 2 MG/ML IJ SOLN
18.0000 mL | Freq: Once | INTRAMUSCULAR | Status: AC
  Administered 2023-08-25: 18 mL via PERINEURAL
  Filled 2023-08-25: qty 20

## 2023-08-25 MED ORDER — MIDAZOLAM HCL 2 MG/2ML IJ SOLN
0.5000 mg | Freq: Once | INTRAMUSCULAR | Status: AC
  Administered 2023-08-25: 1 mg via INTRAVENOUS
  Filled 2023-08-25: qty 2

## 2023-08-25 MED ORDER — LIDOCAINE HCL 2 % IJ SOLN
20.0000 mL | Freq: Once | INTRAMUSCULAR | Status: AC
  Administered 2023-08-25: 400 mg
  Filled 2023-08-25: qty 40

## 2023-08-25 NOTE — Patient Instructions (Signed)

## 2023-08-25 NOTE — Progress Notes (Signed)
Safety precautions to be maintained throughout the outpatient stay will include: orient to surroundings, keep bed in low position, maintain call bell within reach at all times, provide assistance with transfer out of bed and ambulation.  

## 2023-08-25 NOTE — Progress Notes (Signed)
PROVIDER NOTE: Interpretation of information contained herein should be left to medically-trained personnel. Specific patient instructions are provided elsewhere under "Patient Instructions" section of medical record. This document was created in part using STT-dictation technology, any transcriptional errors that may result from this process are unintentional.  Patient: Beth Fox Type: Established DOB: [birth year] MRN: [id number] PCP: Excell Seltzer, MD  Service: Procedure DOS: 08/25/2023 Setting: Ambulatory Location: Ambulatory outpatient facility Delivery: Face-to-face Provider: Edward Jolly, MD Specialty: Interventional Pain Management Specialty designation: 09 Location: Outpatient facility Ref. Prov.: Edward Jolly, MD       Interventional Therapy   Type: Lumbar Facet, Medial Branch Block(s) (w/ fluoroscopic mapping)          Laterality: Bilateral  Level: L3, L4, and L5 Medial Branch Level(s). Injecting these levels blocks the L3-4 and L4-5 lumbar facet joints. Imaging: Fluoroscopic guidance         Anesthesia: Local anesthesia (1-2% Lidocaine) Sedation: Minimal Sedation                       DOS: 08/25/2023 Performed by: Edward Jolly, MD  Primary Purpose: Diagnostic/Therapeutic Indications: Low back pain severe enough to impact quality of life or function. 1. Lumbar facet arthropathy   2. Chronic pain syndrome    NAS-11 Pain score:   Pre-procedure: 8 /10   Post-procedure: 0-No pain/10     Position / Prep / Materials:  Position: Prone  Prep solution: ChloraPrep (2% chlorhexidine gluconate and 70% isopropyl alcohol) Area Prepped: Posterolateral Lumbosacral Spine (Wide prep: From the lower border of the scapula down to the end of the tailbone and from flank to flank.)  Materials:  Tray: Block Needle(s):  Type: Spinal  Gauge (G): 22  Length: 3.5-in Qty: 2      H&P (Pre-op Assessment):  Beth Fox is a 72 y.o. (year old), female patient, seen today for  interventional treatment. She  has a past surgical history that includes Breast surgery; Tubal ligation; Appendectomy; Mastectomy (Right, Oct 1997); Reduction mammaplasty (Left, 2012); and Foot surgery (Left, 06/2018). Beth Fox has a current medication list which includes the following prescription(s): acetaminophen, vitamin d3, gabapentin, hydrochlorothiazide, mometasone, omeprazole, red yeast rice extract, sertraline, tramadol, and trazodone. Her primarily concern today is the Back Pain (Lumbar bilateral )  Initial Vital Signs:  Pulse/HCG Rate: 67ECG Heart Rate: 77 Temp: (!) 97.1 F (36.2 C) Resp: 16 BP: 124/79 SpO2: 95 %  BMI: Estimated body mass index is 27.46 kg/m as calculated from the following:   Height as of this encounter: 5\' 3"  (1.6 m).   Weight as of this encounter: 155 lb (70.3 kg).  Risk Assessment: Allergies: Reviewed. She has No Known Allergies.  Allergy Precautions: None required Coagulopathies: Reviewed. None identified.  Blood-thinner therapy: None at this time Active Infection(s): Reviewed. None identified. Beth Fox is afebrile  Site Confirmation: Beth Fox was asked to confirm the procedure and laterality before marking the site Procedure checklist: Completed Consent: Before the procedure and under the influence of no sedative(s), amnesic(s), or anxiolytics, the patient was informed of the treatment options, risks and possible complications. To fulfill our ethical and legal obligations, as recommended by the American Medical Association's Code of Ethics, I have informed the patient of my clinical impression; the nature and purpose of the treatment or procedure; the risks, benefits, and possible complications of the intervention; the alternatives, including doing nothing; the risk(s) and benefit(s) of the alternative treatment(s) or procedure(s); and the risk(s) and benefit(s) of doing nothing.  The patient was provided information about the general risks and  possible complications associated with the procedure. These may include, but are not limited to: failure to achieve desired goals, infection, bleeding, organ or nerve damage, allergic reactions, paralysis, and death. In addition, the patient was informed of those risks and complications associated to Spine-related procedures, such as failure to decrease pain; infection (i.e.: Meningitis, epidural or intraspinal abscess); bleeding (i.e.: epidural hematoma, subarachnoid hemorrhage, or any other type of intraspinal or peri-dural bleeding); organ or nerve damage (i.e.: Any type of peripheral nerve, nerve root, or spinal cord injury) with subsequent damage to sensory, motor, and/or autonomic systems, resulting in permanent pain, numbness, and/or weakness of one or several areas of the body; allergic reactions; (i.e.: anaphylactic reaction); and/or death. Furthermore, the patient was informed of those risks and complications associated with the medications. These include, but are not limited to: allergic reactions (i.e.: anaphylactic or anaphylactoid reaction(s)); adrenal axis suppression; blood sugar elevation that in diabetics may result in ketoacidosis or comma; water retention that in patients with history of congestive heart failure may result in shortness of breath, pulmonary edema, and decompensation with resultant heart failure; weight gain; swelling or edema; medication-induced neural toxicity; particulate matter embolism and blood vessel occlusion with resultant organ, and/or nervous system infarction; and/or aseptic necrosis of one or more joints. Finally, the patient was informed that Medicine is not an exact science; therefore, there is also the possibility of unforeseen or unpredictable risks and/or possible complications that may result in a catastrophic outcome. The patient indicated having understood very clearly. We have given the patient no guarantees and we have made no promises. Enough time was  given to the patient to ask questions, all of which were answered to the patient's satisfaction. Beth Fox has indicated that she wanted to continue with the procedure. Attestation: I, the ordering provider, attest that I have discussed with the patient the benefits, risks, side-effects, alternatives, likelihood of achieving goals, and potential problems during recovery for the procedure that I have provided informed consent. Date  Time: 08/25/2023  9:07 AM   Pre-Procedure Preparation:  Monitoring: As per clinic protocol. Respiration, ETCO2, SpO2, BP, heart rate and rhythm monitor placed and checked for adequate function Safety Precautions: Patient was assessed for positional comfort and pressure points before starting the procedure. Time-out: I initiated and conducted the "Time-out" before starting the procedure, as per protocol. The patient was asked to participate by confirming the accuracy of the "Time Out" information. Verification of the correct person, site, and procedure were performed and confirmed by me, the nursing staff, and the patient. "Time-out" conducted as per Joint Commission's Universal Protocol (UP.01.01.01). Time: 1004 Start Time: 1004 hrs.  Description of Procedure:          Laterality: (see above) Targeted Levels: (see above)  Safety Precautions: Aspiration looking for blood return was conducted prior to all injections. At no point did we inject any substances, as a needle was being advanced. Before injecting, the patient was told to immediately notify me if she was experiencing any new onset of "ringing in the ears, or metallic taste in the mouth". No attempts were made at seeking any paresthesias. Safe injection practices and needle disposal techniques used. Medications properly checked for expiration dates. SDV (single dose vial) medications used. After the completion of the procedure, all disposable equipment used was discarded in the proper designated medical waste  containers. Local Anesthesia: Protocol guidelines were followed. The patient was positioned over the fluoroscopy table. The  area was prepped in the usual manner. The time-out was completed. The target area was identified using fluoroscopy. A 12-in long, straight, sterile hemostat was used with fluoroscopic guidance to locate the targets for each level blocked. Once located, the skin was marked with an approved surgical skin marker. Once all sites were marked, the skin (epidermis, dermis, and hypodermis), as well as deeper tissues (fat, connective tissue and muscle) were infiltrated with a small amount of a short-acting local anesthetic, loaded on a 10cc syringe with a 25G, 1.5-in  Needle. An appropriate amount of time was allowed for local anesthetics to take effect before proceeding to the next step. Local Anesthetic: Lidocaine 2.0% The unused portion of the local anesthetic was discarded in the proper designated containers. Technical description of process:  L3 Medial Branch Nerve Block (MBB): The target area for the L3 medial branch is at the junction of the postero-lateral aspect of the superior articular process and the superior, posterior, and medial edge of the transverse process of L4. Under fluoroscopic guidance, a Quincke needle was inserted until contact was made with os over the superior postero-lateral aspect of the pedicular shadow (target area). After negative aspiration for blood, 2mL of the nerve block solution was injected without difficulty or complication. The needle was removed intact. L4 Medial Branch Nerve Block (MBB): The target area for the L4 medial branch is at the junction of the postero-lateral aspect of the superior articular process and the superior, posterior, and medial edge of the transverse process of L5. Under fluoroscopic guidance, a Quincke needle was inserted until contact was made with os over the superior postero-lateral aspect of the pedicular shadow (target area).  After negative aspiration for blood, 2mL of the nerve block solution was injected without difficulty or complication. The needle was removed intact. L5 Medial Branch Nerve Block (MBB): The target area for the L5 medial branch is at the junction of the postero-lateral aspect of the superior articular process and the superior, posterior, and medial edge of the sacral ala. Under fluoroscopic guidance, a Quincke needle was inserted until contact was made with os over the superior postero-lateral aspect of the pedicular shadow (target area). After negative aspiration for blood, 2 mL of the nerve block solution was injected without difficulty or complication. The needle was removed intact.   Once the entire procedure was completed, the treated area was cleaned, making sure to leave some of the prepping solution back to take advantage of its long term bactericidal properties.         Illustration of the posterior view of the lumbar spine and the posterior neural structures. Laminae of L2 through S1 are labeled. DPRL5, dorsal primary ramus of L5; DPRS1, dorsal primary ramus of S1; DPR3, dorsal primary ramus of L3; FJ, facet (zygapophyseal) joint L3-L4; I, inferior articular process of L4; LB1, lateral branch of dorsal primary ramus of L1; IAB, inferior articular branches from L3 medial branch (supplies L4-L5 facet joint); IBP, intermediate branch plexus; MB3, medial branch of dorsal primary ramus of L3; NR3, third lumbar nerve root; S, superior articular process of L5; SAB, superior articular branches from L4 (supplies L4-5 facet joint also); TP3, transverse process of L3.   Facet Joint Innervation (* possible contribution)  L1-2 T12, L1 (L2*)  Medial Branch  L2-3 L1, L2 (L3*)         "          "  L3-4 L2, L3 (L4*)         "          "  L4-5 L3, L4 (L5*)         "          "  L5-S1 L4, L5, S1          "          "    Vitals:   08/25/23 1002 08/25/23 1007 08/25/23 1013 08/25/23 1018  BP: (!) 125/91  (!) 136/97 126/83 124/74  Pulse:      Resp: 15 16 15 15   Temp:      TempSrc:      SpO2: 97% 96% 96% 95%  Weight:      Height:         End Time: 1012 hrs.  Imaging Guidance (Spinal):          Type of Imaging Technique: Fluoroscopy Guidance (Spinal) Indication(s): Fluoroscopy guidance for needle placement to enhance accuracy in procedures requiring precise needle localization for targeted delivery of medication in or near specific anatomical locations not easily accessible without such real-time imaging assistance. Exposure Time: Please see nurses notes. Contrast: None used. Fluoroscopic Guidance: I was personally present during the use of fluoroscopy. "Tunnel Vision Technique" used to obtain the best possible view of the target area. Parallax error corrected before commencing the procedure. "Direction-depth-direction" technique used to introduce the needle under continuous pulsed fluoroscopy. Once target was reached, antero-posterior, oblique, and lateral fluoroscopic projection used confirm needle placement in all planes. Images permanently stored in EMR. Interpretation: No contrast injected. I personally interpreted the imaging intraoperatively. Adequate needle placement confirmed in multiple planes. Permanent images saved into the patient's record.  Post-operative Assessment:  Post-procedure Vital Signs:  Pulse/HCG Rate: 6764 Temp: (!) 97.1 F (36.2 C) Resp: 15 BP: 124/74 SpO2: 95 %  EBL: None  Complications: No immediate post-treatment complications observed by team, or reported by patient.  Note: The patient tolerated the entire procedure well. A repeat set of vitals were taken after the procedure and the patient was kept under observation following institutional policy, for this type of procedure. Post-procedural neurological assessment was performed, showing return to baseline, prior to discharge. The patient was provided with post-procedure discharge instructions, including a  section on how to identify potential problems. Should any problems arise concerning this procedure, the patient was given instructions to immediately contact us, at any time, without hesitation. In any case, we plan to contact the patient by telephone for a follow-up status report regarding this interventional procedure.  Comments:  No additional relevant information.  Plan of Care (POC)  Orders:  Orders Placed This Encounter  Procedures   DG PAIN CLIN[facility] C-ARM 1-60 MIN NO REPORT    Intraoperative interpretation by procedural physician at [facility].    Standing Status:   Standing    Number of Occurrences:   1    Order Specific Question:   Reason for exam:    Answer:   Assistance in needle guidance and placement for procedures requiring needle placement in or near specific anatomical locations not easily accessible without such assistance.    Medications ordered for procedure: Meds ordered this encounter  Medications   lidocaine (XYLOCAINE) 2 % (with pres) injection 400 mg   lactated ringers infusion   midazolam (VERSED) injection 0.5-2 mg    Make sure Flumazenil is available in the pyxis when using this medication. If oversedation occurs, administer 0.2 mg IV over 15 sec. If after 45 sec no response, administer 0.2 mg again over 1 min; may repeat at 1 min intervals; not to  exceed 4 doses (1 mg)   dexamethasone (DECADRON) injection 10 mg   dexamethasone (DECADRON) injection 10 mg   ropivacaine (PF) 2 mg/mL (0.2%) (NAROPIN) injection 18 mL   Medications administered: We administered lidocaine, lactated ringers, midazolam, dexamethasone, dexamethasone, and ropivacaine (PF) 2 mg/mL (0.2%).  See the medical record for exact dosing, route, and time of administration.  Follow-up plan:   Return in about 2 weeks (around 09/08/2023), or IN PERSON PPE, CAN BE 2-3 WEEKS.       Recent Visits Date Type Provider Dept  07/27/23 Office Visit Edward Jolly, MD [facility]   Showing recent visits within past 90 days and meeting all other requirements Today's Visits Date Type Provider Dept  08/25/23 Procedure visit Edward Jolly, MD [facility]  Showing today's visits and meeting all other requirements Future Appointments Date Type Provider Dept  09/08/23 Appointment Edward Jolly, MD [facility]  Showing future appointments within next 90 days and meeting all other requirements  Disposition: Discharge home  Discharge (Date  Time): 08/25/2023; 1030 hrs.   Primary Care Physician: Excell Seltzer, MD Location: [facility] Note by: Edward Jolly, MD (TTS technology used. I apologize for any typographical errors that were not detected and corrected.) Date: 08/25/2023; Time: 12:00 PM  Disclaimer:  Medicine is not an Visual merchandiser. The only guarantee in medicine is that nothing is guaranteed. It is important to note that the decision to proceed with this intervention was based on the information collected from the patient. The Data and conclusions were drawn from the patient's questionnaire, the interview, and the physical examination. Because the information was provided in large part by the patient, it cannot be guaranteed that it has not been purposely or unconsciously manipulated. Every effort has been made to obtain as much relevant data as possible for this evaluation. It is important to note that the conclusions that lead to this procedure are derived in large part from the available data. Always take into account that the treatment will also be dependent on availability of resources and existing treatment guidelines, considered by other Pain Management Practitioners as being common knowledge and practice, at the time of the intervention. For Medico-Legal purposes, it is also important to point out that variation in procedural techniques and pharmacological choices are the acceptable norm. The indications,  contraindications, technique, and results of the above procedure should only be interpreted and judged by a Board-Certified Interventional Pain Specialist with extensive familiarity and expertise in the same exact procedure and technique.

## 2023-08-26 ENCOUNTER — Telehealth: Payer: Self-pay | Admitting: *Deleted

## 2023-08-26 NOTE — Telephone Encounter (Signed)
Post procedure call:   no  questions or concerns.  

## 2023-08-31 ENCOUNTER — Ambulatory Visit: Payer: Medicare Other | Admitting: Podiatry

## 2023-09-01 ENCOUNTER — Telehealth: Payer: Self-pay

## 2023-09-01 NOTE — Transitions of Care (Post Inpatient/ED Visit) (Signed)
pt seen [facility] ED on 11/105/24 with rt side pain; dx rt ovarian cyst; pt was to FU with GYN in [place] and pt plans to call for appt with Dr Vena Austria [phone].. pt said if needed she would call for appt with Dr Ermalene Searing but at this time pt is not in pain and no nausea. UC & ED precautions also given and pt voiced understanding. Sending note to Dr Ermalene Searing.      09/01/2023  Name: Beth Fox MRN: [id number] DOB: 01-07-1951  Today's TOC FU Call Status: Today's TOC FU Call Status:: Successful TOC FU Call Completed Unsuccessful Call (1st Attempt) Date: 09/01/23 [facility] FU Call Complete Date: 09/01/23 Patient's Name and Date of Birth confirmed.  Transition Care Management Follow-up Telephone Call Date of Discharge: 08/27/23 Discharge Facility: Other [facility]) Name of Other (Non[facility]) Discharge Facility: [facility] Type of Discharge: Emergency Department Reason for ED Visit: Other: (pt seen [facility] ED on 11/105/24 with rt side pain; dx rt ovarian cyst; pt was to FU with GYN in [place] and pt plans to call for appt.) How have you been since you were released from the hospital?: Better Any questions or concerns?: No  Items Reviewed: Did you receive and understand the discharge instructions provided?: Yes Medications obtained,verified, and reconciled?: Yes (Medications Reviewed) (pt has not had to take ondansetron for nausea since the weekend.) Any new allergies since your discharge?: No Dietary orders reviewed?: NA Do you have support at home?: Yes People in Home: spouse Name of Support/Comfort Primary Source: John  Medications Reviewed Today: Medications Reviewed Today     Reviewed by Patience Musca, LPN (Licensed Practical Nurse) on 09/01/23 at 1027  Med List Status: <None>   Medication Order Taking? Sig Documenting Provider Last Dose Status Informant  acetaminophen (TYLENOL) 500 MG tablet [id number] No  Take 500 mg by mouth every 8 (eight) hours as needed. [provider] Taking Active   Cholecalciferol (VITAMIN D3) 1.25 MG (50000 UT) CAPS [id number] No TAKE 1 CAPSULE BY MOUTH ONE TIME PER WEEK Worthy Rancher B, FNP Taking Active   gabapentin (NEURONTIN) 100 MG capsule [id number] No TAKE 1 CAPSULE BY MOUTH 3 TIMES DAILY. START AT 100 MG AT BEDTIME & GRADUALLY INCREASE TO 3XDAY  Patient taking differently: Pt takes 100 mg AM and 300 mg HS   Bedsole, Amy E, MD Taking Active   hydrochlorothiazide (HYDRODIURIL) 25 MG tablet [id number] No TAKE 1 TABLET BY MOUTH EVERY DAY Bedsole, Amy E, MD Taking Active   mometasone (ELOCON) 0.1 % cream [id number] No Apply 1-2 times daily as needed for rash. Avoid applying to face, groin, and axilla. Use as directed. Long-term use can cause thinning of the skin. Willeen Niece, MD Taking Active   omeprazole (PRILOSEC) 20 MG capsule [id number] No Take 1 capsule (20 mg total) by mouth 2 (two) times daily before a meal. Bedsole, Amy E, MD Taking Active   Red Yeast Rice Extract (RED YEAST RICE PO) [id number] No Take by mouth. [provider] Taking Active   sertraline (ZOLOFT) 50 MG tablet [id number] No TAKE 1 TABLET BY MOUTH EVERY DAY Bedsole, Amy E, MD Taking Active   traMADol (ULTRAM) 50 MG tablet [id number] No Take 25-50 mg by mouth 2 (two) times daily as needed. [provider] Taking Active   traZODone (DESYREL) 100 MG tablet [id number] No TAKE 1 TABLET BY MOUTH EVERYDAY AT BEDTIME Bedsole, Amy E, MD Taking  Active             Home Care and Equipment/Supplies: Were Home Health Services Ordered?: NA Any new equipment or medical supplies ordered?: NA  Functional Questionnaire: Do you need assistance with bathing/showering or dressing?: No Do you need assistance with meal preparation?: No Do you need assistance with eating?: No Do you have difficulty maintaining continence: No Do you need assistance with getting out of bed/getting out of a  chair/moving?: No Do you have difficulty managing or taking your medications?: No  Follow up appointments reviewed: PCP Follow-up appointment confirmed?: NA Specialist Hospital Follow-up appointment confirmed?: No Reason Specialist Follow-Up Not Confirmed: Patient has Specialist Provider Number and will Call for Appointment (pt mto call Dr Vena Austria [phone].) Do you need transportation to your follow-up appointment?: No Do you understand care options if your condition(s) worsen?: Yes-patient verbalized understanding    SIGNATURE  Lewanda Rife, LPN  Current Outpatient Medications on File Prior to Visit  Medication Sig Dispense Refill   acetaminophen (TYLENOL) 500 MG tablet Take 500 mg by mouth every 8 (eight) hours as needed.     Cholecalciferol (VITAMIN D3) 1.25 MG (50000 UT) CAPS TAKE 1 CAPSULE BY MOUTH ONE TIME PER WEEK 12 capsule 0   gabapentin (NEURONTIN) 100 MG capsule TAKE 1 CAPSULE BY MOUTH 3 TIMES DAILY. START AT 100 MG AT BEDTIME & GRADUALLY INCREASE TO 3XDAY (Patient taking differently: Pt takes 100 mg AM and 300 mg HS) 270 capsule 1   hydrochlorothiazide (HYDRODIURIL) 25 MG tablet TAKE 1 TABLET BY MOUTH EVERY DAY 90 tablet 1   mometasone (ELOCON) 0.1 % cream Apply 1-2 times daily as needed for rash. Avoid applying to face, groin, and axilla. Use as directed. Long-term use can cause thinning of the skin. 45 g 1   omeprazole (PRILOSEC) 20 MG capsule Take 1 capsule (20 mg total) by mouth 2 (two) times daily before a meal. 180 capsule 1   Red Yeast Rice Extract (RED YEAST RICE PO) Take by mouth.     sertraline (ZOLOFT) 50 MG tablet TAKE 1 TABLET BY MOUTH EVERY DAY 90 tablet 1   traMADol (ULTRAM) 50 MG tablet Take 25-50 mg by mouth 2 (two) times daily as needed.     traZODone (DESYREL) 100 MG tablet TAKE 1 TABLET BY MOUTH EVERYDAY AT BEDTIME 90 tablet 0   No current facility-administered medications on file prior to visit.

## 2023-09-01 NOTE — Transitions of Care (Post Inpatient/ED Visit) (Signed)
Unable to reach patient by phone and left v/m requesting call back at [phone].       09/01/2023  Name: Beth Fox MRN: [id number] DOB: [birth year]  Today's TOC FU Call Status: Today's TOC FU Call Status:: Unsuccessful Call (1st Attempt) Unsuccessful Call (1st Attempt) Date: 09/01/23  Attempted to reach the patient regarding the most recent Inpatient/ED visit.  Follow Up Plan: Additional outreach attempts will be made to reach the patient to complete the Transitions of Care (Post Inpatient/ED visit) call.   Signature Lewanda Rife, LPN

## 2023-09-01 NOTE — Telephone Encounter (Signed)
Noted and agreed. No need for follow up here... just at GYN.

## 2023-09-01 NOTE — Telephone Encounter (Signed)
Patient returned your call, would like a call back when possible.

## 2023-09-02 ENCOUNTER — Ambulatory Visit: Payer: Medicare Other | Admitting: Family Medicine

## 2023-09-02 VITALS — BP 120/70 | HR 75 | Temp 97.4°F | Ht 63.0 in | Wt 157.1 lb

## 2023-09-02 DIAGNOSIS — E876 Hypokalemia: Secondary | ICD-10-CM | POA: Diagnosis not present

## 2023-09-02 DIAGNOSIS — D72829 Elevated white blood cell count, unspecified: Secondary | ICD-10-CM

## 2023-09-02 DIAGNOSIS — N83201 Unspecified ovarian cyst, right side: Secondary | ICD-10-CM

## 2023-09-02 NOTE — Assessment & Plan Note (Signed)
Acute, likely secondary to HCTZ. Encourage patient to keep potassium in diet, consider multivitamin daily.

## 2023-09-02 NOTE — Assessment & Plan Note (Signed)
Acute, most likely secondary to recent steroid injections.  Consider reevaluating at upcoming appointment for physical.

## 2023-09-02 NOTE — Assessment & Plan Note (Signed)
Chronic, unclear if right ovarian cysts are related at all to her recent abdominal pain.  It appears she had similar cyst noted to be stable in 2020 and 2021. Given history of breast cancer and cyst noted in postmenopausal

## 2023-09-02 NOTE — Progress Notes (Signed)
Patient ID: Beth Fox, or should I go in female    DOB: Jan 17, 1951, 71 y.o.   MRN: [id number]  This visit was conducted in person.  BP 120/70 (BP Location: Left Arm, Patient Position: Sitting, Cuff Size: Normal)   Pulse 75   Temp (!) 97.4 F (36.3 C) (Temporal)   Ht 5\' 3"  (1.6 m)   Wt 157 lb 2 oz (71.3 kg)   SpO2 94%   BMI 27.83 kg/m    CC:  Chief Complaint  Patient presents with   Follow-up    ER follow up    Subjective:   HPI: Beth Fox is a 72 y.o. female presenting on 09/02/2023 for Follow-up (ER follow up)  Reviewed ER visit on August 27, 2023 at [facility]  Severe right lower abdominal pain, N/V. Reviewed note in detail CT abdomen pelvis with contrast Impression 1. No acute findings in the abdomen pelvis. 2. Appendix not identified. No secondary signs of appendicitis. 3. Cholelithiasis without evidence cholecystitis. 4. Moderate hiatal hernia. 5. Right ovarian simple-appearing cysts measuring up to 2 3.3 cm in post menopausal female. Recommend follow-up pelvic ultrasound in 6-12 months.  Transitional care call placed all November 24 She states she has a follow-up with her gynecologist but realized that her gynecologist has now retired.  She needs referral to establish with a new  one.  Today she reports she is feeling better overall.   Minimal nausea after 24 hour   Just feeling week. No further pain in RLQ.  No fever.   She is drinking lots of water.   Wbc elevated but did get decadron 11/13.   She has personal history of breast cancer She also had ovarian cyst seen on ultrasound in 2020, repeat ultrasound in 2021 showed 2 stable simple ovarian cyst  Sister with  ovarian cyst.. no family history of  ovarian cancer,  aunts with breast cancer   Relevant past medical, surgical, family and social history reviewed and updated as indicated. Interim medical history since our last visit  reviewed. Allergies and medications reviewed and updated. Outpatient Medications Prior to Visit  Medication Sig Dispense Refill   acetaminophen (TYLENOL) 500 MG tablet Take 500 mg by mouth every 8 (eight) hours as needed.     Cholecalciferol (VITAMIN D3) 1.25 MG (50000 UT) CAPS TAKE 1 CAPSULE BY MOUTH ONE TIME PER WEEK 12 capsule 0   gabapentin (NEURONTIN) 100 MG capsule Take 1 capsule by mouth every morning and 3 capsules by mouth at bedtime     hydrochlorothiazide (HYDRODIURIL) 25 MG tablet TAKE 1 TABLET BY MOUTH EVERY DAY 90 tablet 1   mometasone (ELOCON) 0.1 % cream Apply 1-2 times daily as needed for rash. Avoid applying to face, groin, and axilla. Use as directed. Long-term use can cause thinning of the skin. 45 g 1   omeprazole (PRILOSEC) 20 MG capsule Take 1 capsule (20 mg total) by mouth 2 (two) times daily before a meal. 180 capsule 1   Red Yeast Rice Extract (RED YEAST RICE PO) Take by mouth.     sertraline (ZOLOFT) 50 MG tablet TAKE 1 TABLET BY MOUTH EVERY DAY 90 tablet 1   traMADol (ULTRAM) 50 MG tablet Take 25-50 mg by mouth 2 (two) times daily as needed.     traZODone (DESYREL) 100 MG tablet TAKE 1 TABLET BY MOUTH EVERYDAY AT BEDTIME 90 tablet 0   gabapentin (NEURONTIN) 100 MG capsule TAKE 1 CAPSULE  BY MOUTH 3 TIMES DAILY. START AT 100 MG AT BEDTIME & GRADUALLY INCREASE TO 3XDAY (Patient taking differently: Pt takes 100 mg AM and 300 mg HS) 270 capsule 1   ondansetron (ZOFRAN-ODT) 4 MG disintegrating tablet Take 4 mg by mouth every 8 (eight) hours as needed.     No facility-administered medications prior to visit.     Per HPI unless specifically indicated in ROS section below Review of Systems  Constitutional:  Negative for fatigue and fever.  HENT:  Negative for congestion.   Eyes:  Negative for pain.  Respiratory:  Negative for cough and shortness of breath.   Cardiovascular:  Negative for chest pain, palpitations and leg swelling.  Gastrointestinal:  Negative for  abdominal pain.  Genitourinary:  Negative for dysuria and vaginal bleeding.  Musculoskeletal:  Negative for back pain.  Neurological:  Negative for syncope, light-headedness and headaches.  Psychiatric/Behavioral:  Negative for dysphoric mood.    Objective:  BP 120/70 (BP Location: Left Arm, Patient Position: Sitting, Cuff Size: Normal)   Pulse 75   Temp (!) 97.4 F (36.3 C) (Temporal)   Ht 5\' 3"  (1.6 m)   Wt 157 lb 2 oz (71.3 kg)   SpO2 94%   BMI 27.83 kg/m   Wt Readings from Last 3 Encounters:  09/02/23 157 lb 2 oz (71.3 kg)  08/25/23 155 lb (70.3 kg)  07/27/23 160 lb (72.6 kg)      Physical Exam Constitutional:      General: She is not in acute distress.    Appearance: Normal appearance. She is well-developed. She is not ill-appearing or toxic-appearing.  HENT:     Head: Normocephalic.     Right Ear: Hearing, tympanic membrane, ear canal and external ear normal. Tympanic membrane is not erythematous, retracted or bulging.     Left Ear: Hearing, tympanic membrane, ear canal and external ear normal. Tympanic membrane is not erythematous, retracted or bulging.     Nose: No mucosal edema or rhinorrhea.     Right Sinus: No maxillary sinus tenderness or frontal sinus tenderness.     Left Sinus: No maxillary sinus tenderness or frontal sinus tenderness.     Mouth/Throat:     Mouth: Oropharynx is clear and moist and mucous membranes are normal.     Pharynx: Uvula midline.  Eyes:     General: Lids are normal. Lids are everted, no foreign bodies appreciated.     Extraocular Movements: EOM normal.     Conjunctiva/sclera: Conjunctivae normal.     Pupils: Pupils are equal, round, and reactive to light.  Neck:     Thyroid: No thyroid mass or thyromegaly.     Vascular: No carotid bruit.     Trachea: Trachea normal.  Cardiovascular:     Rate and Rhythm: Normal rate and regular rhythm.     Pulses: Normal pulses.     Heart sounds: Normal heart sounds, S1 normal and S2 normal. No  murmur heard.    No friction rub. No gallop.  Pulmonary:     Effort: Pulmonary effort is normal. No tachypnea or respiratory distress.     Breath sounds: Normal breath sounds. No decreased breath sounds, wheezing, rhonchi or rales.  Abdominal:     General: Bowel sounds are normal.     Palpations: Abdomen is soft.     Tenderness: There is no abdominal tenderness.  Musculoskeletal:     Cervical back: Normal range of motion and neck supple.  Skin:    General: Skin is  warm, dry and intact.     Findings: No rash.  Neurological:     Mental Status: She is alert.  Psychiatric:        Mood and Affect: Mood is not anxious or depressed.        Speech: Speech normal.        Behavior: Behavior normal. Behavior is cooperative.        Thought Content: Thought content normal.        Cognition and Memory: Cognition and memory normal.        Judgment: Judgment normal.       Results for orders placed or performed in visit on 12/02/22  VITAMIN D 25 Hydroxy (Vit-D Deficiency, Fractures)  Result Value Ref Range   VITD 24.69 (L) 30.00 - 100.00 ng/mL  Hemoglobin A1c  Result Value Ref Range   Hgb A1c MFr Bld 5.8 4.6 - 6.5 %  Comprehensive metabolic panel  Result Value Ref Range   Sodium 142 135 - 145 mEq/L   Potassium 3.8 3.5 - 5.1 mEq/L   Chloride 100 96 - 112 mEq/L   CO2 31 19 - 32 mEq/L   Glucose, Bld 100 (H) 70 - 99 mg/dL   BUN 21 6 - 23 mg/dL   Creatinine, Ser 1.61 0.40 - 1.20 mg/dL   Total Bilirubin 0.4 0.2 - 1.2 mg/dL   Alkaline Phosphatase 88 39 - 117 U/L   AST 15 0 - 37 U/L   ALT 15 0 - 35 U/L   Total Protein 6.7 6.0 - 8.3 g/dL   Albumin 4.3 3.5 - 5.2 g/dL   GFR 09.60 (L) >45.40 mL/min   Calcium 10.1 8.4 - 10.5 mg/dL  Lipid panel  Result Value Ref Range   Cholesterol 196 0 - 200 mg/dL   Triglycerides 981.1 (H) 0.0 - 149.0 mg/dL   HDL 91.47 >82.95 mg/dL   VLDL 62.1 0.0 - 30.8 mg/dL   LDL Cholesterol 657 (H) 0 - 99 mg/dL   Total CHOL/HDL Ratio 5    NonHDL 155.28      Assessment and Plan  Right ovarian cyst Assessment & Plan: Chronic, unclear if right ovarian cysts are related at all to her recent abdominal pain.  It appears she had similar cyst noted to be stable in 2020 and 2021. Given history of breast cancer and cyst noted in postmenopausal  Orders: -     Ambulatory referral to Gynecology  Hypokalemia Assessment & Plan: Acute, likely secondary to HCTZ. Encourage patient to keep potassium in diet, consider multivitamin daily.   Leukocytosis, unspecified type Assessment & Plan: Acute, most likely secondary to recent steroid injections.  Consider reevaluating at upcoming appointment for physical.     No follow-ups on file.   Kerby Nora, MD

## 2023-09-02 NOTE — Patient Instructions (Addendum)
Keep  up with fluids, increase potassium in diet. Consider taking multivitamin.  We will move forward with referral.  Follow up if pain returns.

## 2023-09-08 ENCOUNTER — Ambulatory Visit
Payer: Medicare Other | Attending: Student in an Organized Health Care Education/Training Program | Admitting: Student in an Organized Health Care Education/Training Program

## 2023-09-08 ENCOUNTER — Encounter: Payer: Self-pay | Admitting: Student in an Organized Health Care Education/Training Program

## 2023-09-08 VITALS — BP 140/86 | HR 68 | Temp 97.0°F | Resp 14 | Ht 63.0 in | Wt 155.0 lb

## 2023-09-08 DIAGNOSIS — M461 Sacroiliitis, not elsewhere classified: Secondary | ICD-10-CM | POA: Insufficient documentation

## 2023-09-08 DIAGNOSIS — G5703 Lesion of sciatic nerve, bilateral lower limbs: Secondary | ICD-10-CM | POA: Insufficient documentation

## 2023-09-08 DIAGNOSIS — M5136 Other intervertebral disc degeneration, lumbar region with discogenic back pain only: Secondary | ICD-10-CM | POA: Diagnosis present

## 2023-09-08 DIAGNOSIS — G5701 Lesion of sciatic nerve, right lower limb: Secondary | ICD-10-CM | POA: Insufficient documentation

## 2023-09-08 DIAGNOSIS — M47816 Spondylosis without myelopathy or radiculopathy, lumbar region: Secondary | ICD-10-CM | POA: Insufficient documentation

## 2023-09-08 NOTE — Patient Instructions (Signed)
Moderate Conscious Sedation, Adult, Care After After the procedure, it is common to have: Sleepiness for a few hours. Impaired judgment for a few hours. Trouble with balance. Nausea or vomiting if you eat too soon. Follow these instructions at home: For the time period you were told by your health care provider:  Rest. Do not participate in activities where you could fall or become injured. Do not drive or use machinery. Do not drink alcohol. Do not take sleeping pills or medicines that cause drowsiness. Do not make important decisions or sign legal documents. Do not take care of children on your own. Eating and drinking Follow instructions from your health care provider about what you may eat and drink. Drink enough fluid to keep your urine pale yellow. If you vomit: Drink clear fluids slowly and in small amounts as you are able. Clear fluids include water, ice chips, low-calorie sports drinks, and fruit juice that has water added to it (diluted fruit juice). Eat light and bland foods in small amounts as you are able. These foods include bananas, applesauce, rice, lean meats, toast, and crackers. General instructions Take over-the-counter and prescription medicines only as told by your health care provider. Have a responsible adult stay with you for the time you are told. Do not use any products that contain nicotine or tobacco. These products include cigarettes, chewing tobacco, and vaping devices, such as e-cigarettes. If you need help quitting, ask your health care provider. Return to your normal activities as told by your health care provider. Ask your health care provider what activities are safe for you. Your health care provider may give you more instructions. Make sure you know what you can and cannot do. Contact a health care provider if: You are still sleepy or having trouble with balance after 24 hours. You feel light-headed. You vomit every time you eat or drink. You get  a rash. You have a fever. You have redness or swelling around the IV site. Get help right away if: You have trouble breathing. You start to feel confused at home. These symptoms may be an emergency. Get help right away. Call 911. Do not wait to see if the symptoms will go away. Do not drive yourself to the hospital. This information is not intended to replace advice given to you by your health care provider. Make sure you discuss any questions you have with your health care provider. Document Revised: 04/13/2022 Document Reviewed: 04/13/2022 [employer] Patient Education  2024 [employer] GENERAL RISKS AND COMPLICATIONS  What are the risk, side effects and possible complications? Generally speaking, most procedures are safe.  However, with any procedure there are risks, side effects, and the possibility of complications.  The risks and complications are dependent upon the sites that are lesioned, or the type of nerve block to be performed.  The closer the procedure is to the spine, the more serious the risks are.  Great care is taken when placing the radio frequency needles, block needles or lesioning probes, but sometimes complications can occur. Infection: Any time there is an injection through the skin, there is a risk of infection.  This is why sterile conditions are used for these blocks.  There are four possible types of infection. Localized skin infection. Central Nervous System Infection-This can be in the form of Meningitis, which can be deadly. Epidural Infections-This can be in the form of an epidural abscess, which can cause pressure inside of the spine, causing compression of the spinal cord with subsequent  paralysis. This would require an emergency surgery to decompress, and there are no guarantees that the patient would recover from the paralysis. Discitis-This is an infection of the intervertebral discs.  It occurs in about 1% of discography procedures.  It is difficult to treat  and it may lead to surgery.        2. Pain: the needles have to go through skin and soft tissues, will cause soreness.       3. Damage to internal structures:  The nerves to be lesioned may be near blood vessels or    other nerves which can be potentially damaged.       4. Bleeding: Bleeding is more common if the patient is taking blood thinners such as  aspirin, Coumadin, Ticiid, Plavix, etc., or if he/she have some genetic predisposition  such as hemophilia. Bleeding into the spinal canal can cause compression of the spinal  cord with subsequent paralysis.  This would require an emergency surgery to  decompress and there are no guarantees that the patient would recover from the  paralysis.       5. Pneumothorax:  Puncturing of a lung is a possibility, every time a needle is introduced in  the area of the chest or upper back.  Pneumothorax refers to free air around the  collapsed lung(s), inside of the thoracic cavity (chest cavity).  Another two possible  complications related to a similar event would include: Hemothorax and Chylothorax.   These are variations of the Pneumothorax, where instead of air around the collapsed  lung(s), you may have blood or chyle, respectively.       6. Spinal headaches: They may occur with any procedures in the area of the spine.       7. Persistent CSF (Cerebro-Spinal Fluid) leakage: This is a rare problem, but may occur  with prolonged intrathecal or epidural catheters either due to the formation of a fistulous  track or a dural tear.       8. Nerve damage: By working so close to the spinal cord, there is always a possibility of  nerve damage, which could be as serious as a permanent spinal cord injury with  paralysis.       9. Death:  Although rare, severe deadly allergic reactions known as "Anaphylactic  reaction" can occur to any of the medications used.      10. Worsening of the symptoms:  We can always make thing worse.  What are the chances of something like this  happening? Chances of any of this occuring are extremely low.  By statistics, you have more of a chance of getting killed in a motor vehicle accident: while driving to the hospital than any of the above occurring .  Nevertheless, you should be aware that they are possibilities.  In general, it is similar to taking a shower.  Everybody knows that you can slip, hit your head and get killed.  Does that mean that you should not shower again?  Nevertheless always keep in mind that statistics do not mean anything if you happen to be on the wrong side of them.  Even if a procedure has a 1 (one) in a 1,000,000 (million) chance of going wrong, it you happen to be that one..Also, keep in mind that by statistics, you have more of a chance of having something go wrong when taking medications.  Who should not have this procedure? If you are on a blood thinning medication (e.g. Coumadin, Plavix, see  list of "Blood Thinners"), or if you have an active infection going on, you should not have the procedure.  If you are taking any blood thinners, please inform your physician.  How should I prepare for this procedure? Do not eat or drink anything at least six hours prior to the procedure. Bring a driver with you .  It cannot be a taxi. Come accompanied by an adult that can drive you back, and that is strong enough to help you if your legs get weak or numb from the local anesthetic. Take all of your medicines the morning of the procedure with just enough water to swallow them. If you have diabetes, make sure that you are scheduled to have your procedure done first thing in the morning, whenever possible. If you have diabetes, take only half of your insulin dose and notify our nurse that you have done so as soon as you arrive at the clinic. If you are diabetic, but only take blood sugar pills (oral hypoglycemic), then do not take them on the morning of your procedure.  You may take them after you have had the procedure. Do  not take aspirin or any aspirin-containing medications, at least eleven (11) days prior to the procedure.  They may prolong bleeding. Wear loose fitting clothing that may be easy to take off and that you would not mind if it got stained with Betadine or blood. Do not wear any jewelry or perfume Remove any nail coloring.  It will interfere with some of our monitoring equipment.  NOTE: Remember that this is not meant to be interpreted as a complete list of all possible complications.  Unforeseen problems may occur.  BLOOD THINNERS The following drugs contain aspirin or other products, which can cause increased bleeding during surgery and should not be taken for 2 weeks prior to and 1 week after surgery.  If you should need take something for relief of minor pain, you may take acetaminophen which is found in Tylenol,m Datril, Anacin-3 and Panadol. It is not blood thinner. The products listed below are.  Do not take any of the products listed below in addition to any listed on your instruction sheet.  A.P.C or A.P.C with Codeine Codeine Phosphate Capsules #3 Ibuprofen Ridaura  ABC compound Congesprin Imuran rimadil  Advil Cope Indocin Robaxisal  Alka-Seltzer Effervescent Pain Reliever and Antacid Coricidin or Coricidin-D  Indomethacin Rufen  Alka-Seltzer plus Cold Medicine Cosprin Ketoprofen S-A-C Tablets  Anacin Analgesic Tablets or Capsules Coumadin Korlgesic Salflex  Anacin Extra Strength Analgesic tablets or capsules CP-2 Tablets Lanoril Salicylate  Anaprox Cuprimine Capsules Levenox Salocol  Anexsia-D Dalteparin Magan Salsalate  Anodynos Darvon compound Magnesium Salicylate Sine-off  Ansaid Dasin Capsules Magsal Sodium Salicylate  Anturane Depen Capsules Marnal Soma  APF Arthritis pain formula Dewitt's Pills Measurin Stanback  Argesic Dia-Gesic Meclofenamic Sulfinpyrazone  Arthritis Bayer Timed Release Aspirin Diclofenac Meclomen Sulindac  Arthritis pain formula Anacin Dicumarol Medipren  Supac  Analgesic (Safety coated) Arthralgen Diffunasal Mefanamic Suprofen  Arthritis Strength Bufferin Dihydrocodeine Mepro Compound Suprol  Arthropan liquid Dopirydamole Methcarbomol with Aspirin Synalgos  ASA tablets/Enseals Disalcid Micrainin Tagament  Ascriptin Doan's Midol Talwin  Ascriptin A/D Dolene Mobidin Tanderil  Ascriptin Extra Strength Dolobid Moblgesic Ticlid  Ascriptin with Codeine Doloprin or Doloprin with Codeine Momentum Tolectin  Asperbuf Duoprin Mono-gesic Trendar  Aspergum Duradyne Motrin or Motrin IB Triminicin  Aspirin plain, buffered or enteric coated Durasal Myochrisine Trigesic  Aspirin Suppositories Easprin Nalfon Trillsate  Aspirin with Codeine Ecotrin Regular or Extra  Strength Naprosyn Uracel  Atromid-S Efficin Naproxen Ursinus  Auranofin Capsules Elmiron Neocylate Vanquish  Axotal Emagrin Norgesic Verin  Azathioprine Empirin or Empirin with Codeine Normiflo Vitamin E  Azolid Emprazil Nuprin Voltaren  Bayer Aspirin plain, buffered or children's or timed BC Tablets or powders Encaprin Orgaran Warfarin Sodium  Buff-a-Comp Enoxaparin Orudis Zorpin  Buff-a-Comp with Codeine Equegesic Os-Cal-Gesic   Buffaprin Excedrin plain, buffered or Extra Strength Oxalid   Bufferin Arthritis Strength Feldene Oxphenbutazone   Bufferin plain or Extra Strength Feldene Capsules Oxycodone with Aspirin   Bufferin with Codeine Fenoprofen Fenoprofen Pabalate or Pabalate-SF   Buffets II Flogesic Panagesic   Buffinol plain or Extra Strength Florinal or Florinal with Codeine Panwarfarin   Buf-Tabs Flurbiprofen Penicillamine   Butalbital Compound Four-way cold tablets Penicillin   Butazolidin Fragmin Pepto-Bismol   Carbenicillin Geminisyn Percodan   Carna Arthritis Reliever Geopen Persantine   Carprofen Gold's salt Persistin   Chloramphenicol Goody's Phenylbutazone   Chloromycetin Haltrain Piroxlcam   Clmetidine heparin Plaquenil   Cllnoril Hyco-pap Ponstel   Clofibrate Hydroxy  chloroquine Propoxyphen         Before stopping any of these medications, be sure to consult the physician who ordered them.  Some, such as Coumadin (Warfarin) are ordered to prevent or treat serious conditions such as "deep thrombosis", "pumonary embolisms", and other heart problems.  The amount of time that you may need off of the medication may also vary with the medication and the reason for which you were taking it.  If you are taking any of these medications, please make sure you notify your pain physician before you undergo any procedures.         Trigger Point Injections Patient Information  Description: Trigger points are areas of muscle sensitive to touch which cause pain with movement, sometimes felt some distance from the site of palpation.  Usually the muscle containing these trigger points if felt as a tight band or knot.   The area of maximum tenderness or trigger point is identified, and after antiseptic preparation of the skin, a small needle is placed into this site.  Reproduction of the pain often occurs and numbing medicine (local anesthetic) is injected into the site, sometimes along with steroid preparation.  The entire block usually lasts less than 5 minutes.  Conditions which may be treated by trigger points:  Muscular pain and spasm Nerve irritation  Preparation for the injection:  Do not eat any solid food or dairy products within 8 hours of your appointment. You may drink clear liquids up to 3 hours before appointment.  Clear liquids include water, black coffee, juice or soda.  No milk or cream please. You may take your regular medications, including pain medications, with a sip of water before your appointment.  Diabetics should hold regular insulin ( if take separately) and take 1/2 normal NPH dose the morning of the procedure.  Carry some sugar containing items with you to your appointment. A driver must accompany you and be prepared to drive you home after  your procedure.  Bring all your current medications with you. An IV may be inserted and sedation may be given at the discretion of the physician.  A blood pressure cuff, EKG, and other monitors will often be applied during the procedure.  Some patients may need to have extra oxygen administered for a short period. You will be asked to provide medical information, including your allergies and medications, prior to the procedure.  We must know immediately if you  are taking blood thinners (like Coumadin/Warfarin) or if you are allergic to IV iodine contrast (dye).  We must know if you could possibly be pregnant.  Possible side-effects:  Bleeding from needle site Infection (rare, may require surgery) Nerve injury (rare) Numbness & tingling (temporary) Punctured lung (if injection around chest) Light-headedness (temporary) Pain at injection site (several days) Decreased blood pressure (rare, temporary) Weakness in arm/leg (temporary)  Call if you experience:  Hive or difficulty breathing (go to the emergency room) Inflammation or drainage at the injection site(s)  Please note:  Although the local anesthetic injected can often make your painful muscle feel good for several hours after the injection, the pain may return.  It takes 3-7 days for steroids to work.  You may not notice any pain relief for at least one week.  If effective, we will often do a series of injections spaced 3-6 weeks apart to maximally decrease your pain.  If you have any questions please call [phone] [facility] Clinic[facility] Injection Patient Information  Description: The sacroiliac joint connects the scrum (very low back and tailbone) to the ilium (a pelvic bone which also forms half of the hip joint).  Normally this joint experiences very little motion.  When this joint becomes inflamed or unstable low back and or hip and pelvis pain may result.  Injection of this  joint with local anesthetics (numbing medicines) and steroids can provide diagnostic information and reduce pain.  This injection is performed with the aid of x-ray guidance into the tailbone area while you are lying on your stomach.   You may experience an electrical sensation down the leg while this is being done.  You may also experience numbness.  We also may ask if we are reproducing your normal pain during the injection.  Conditions which may be treated SI injection:  Low back, buttock, hip or leg pain  Preparation for the Injection:  Do not eat any solid food or dairy products within 8 hours of your appointment.  You may drink clear liquids up to 3 hours before appointment.  Clear liquids include water, black coffee, juice or soda.  No milk or cream please. You may take your regular medications, including pain medications with a sip of water before your appointment.  Diabetics should hold regular insulin (if take separately) and take 1/2 normal NPH dose the morning of the procedure.  Carry some sugar containing items with you to your appointment. A driver must accompany you and be prepared to drive you home after your procedure. Bring all of your current medications with you. An IV may be inserted and sedation may be given at the discretion of the physician. A blood pressure cuff, EKG and other monitors will often be applied during the procedure.  Some patients may need to have extra oxygen administered for a short period.  You will be asked to provide medical information, including your allergies, prior to the procedure.  We must know immediately if you are taking blood thinners (like Coumadin/Warfarin) or if you are allergic to IV iodine contrast (dye).  We must know if you could possible be pregnant.  Possible side effects:  Bleeding from needle site Infection (rare, may require surgery) Nerve injury (rare) Numbness & tingling (temporary) A brief convulsion or  seizure Light-headedness (temporary) Pain at injection site (several days) Decreased blood pressure (temporary) Weakness in the leg (temporary)   Call if you experience:  New onset weakness or numbness of  an extremity below the injection site that last more than 8 hours. Hives or difficulty breathing ( go to the emergency room) Inflammation or drainage at the injection site Any new symptoms which are concerning to you  Please note:  Although the local anesthetic injected can often make your back/ hip/ buttock/ leg feel good for several hours after the injections, the pain will likely return.  It takes 3-7 days for steroids to work in the sacroiliac area.  You may not notice any pain relief for at least that one week.  If effective, we will often do a series of three injections spaced 3-6 weeks apart to maximally decrease your pain.  After the initial series, we generally will wait some months before a repeat injection of the same type.  If you have any questions, please call [phone] [facility]

## 2023-09-08 NOTE — Progress Notes (Signed)
PROVIDER NOTE: Information contained herein reflects review and annotations entered in association with encounter. Interpretation of such information and data should be left to medically-trained personnel. Information provided to patient can be located elsewhere in the medical record under "Patient Instructions". Document created using STT-dictation technology, any transcriptional errors that may result from process are unintentional.    Patient: Beth Fox  Service Category: E/M  Provider: Edward Jolly, MD  DOB: [birth year]  DOS: 09/08/2023  Referring Provider: Excell Seltzer, MD  MRN: [id number]  Specialty: Interventional Pain Management  PCP: Excell Seltzer, MD  Type: Established Patient  Setting: Ambulatory outpatient    Location: Office  Delivery: Face-to-face     HPI  Beth Fox, a 72 y.o. 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 Beth Fox primary complain today is Back Pain (lower)  Pertinent problems: Beth Fox does not have any pertinent problems on file. Pain Assessment: Severity of Chronic pain is reported as a 7 /10. Location: Back Lower/denies. Onset: More than a month ago. Quality: Sharp. Timing: Constant. Modifying factor(s): lying down, sitting. Vitals:  height is 5\' 3"  (1.6 m) and weight is 155 lb (70.3 kg). Her temporal temperature is 97 F (36.1 C) (abnormal). Her blood pressure is 140/86 (abnormal) and her pulse is 68. Her respiration is 14 and oxygen saturation is 100%.  BMI: Estimated body mass index is 27.46 kg/m as calculated from the following:   Height as of this encounter: 5\' 3"  (1.6 m).   Weight as of this encounter: 155 lb (70.3 kg). Last encounter: 07/27/2023. Last procedure: 08/25/2023.  Reason for encounter: post-procedure evaluation and assessment.    Post-procedure evaluation   Type: Lumbar Facet, Medial Branch Block(s) (w/ fluoroscopic mapping)          Laterality: Bilateral  Level: L3, L4, and  L5 Medial Branch Level(s). Injecting these levels blocks the L3-4 and L4-5 lumbar facet joints. Imaging: Fluoroscopic guidance         Anesthesia: Local anesthesia (1-2% Lidocaine) Sedation: Minimal Sedation                       DOS: 08/25/2023 Performed by: Edward Jolly, MD  Primary Purpose: Diagnostic/Therapeutic Indications: Low back pain severe enough to impact quality of life or function. 1. Lumbar facet arthropathy   2. Chronic pain syndrome    NAS-11 Pain score:   Pre-procedure: 8 /10   Post-procedure: 0-No pain/10      Effectiveness:  Initial hour after procedure: 100 %  Subsequent 4-6 hours post-procedure:100% Analgesia past initial 6 hours:100% for 2 days Ongoing improvement:  Analgesic:  back to baseline     ROS  Constitutional: Denies any fever or chills Gastrointestinal: No reported hemesis, hematochezia, vomiting, or acute GI distress Musculoskeletal:  Low back, right SI joint, right piriformis pain Neurological: No reported episodes of acute onset apraxia, aphasia, dysarthria, agnosia, amnesia, paralysis, loss of coordination, or loss of consciousness  Medication Review  Red Yeast Rice Extract, Vitamin D3, acetaminophen, gabapentin, hydrochlorothiazide, mometasone, omeprazole, sertraline, traMADol, and traZODone  History Review  Allergy: Beth Fox has No Known Allergies. Drug: Beth Fox  reports no history of drug use. Alcohol:  reports no history of alcohol use. Tobacco:  reports that she has never smoked. She has never used smokeless tobacco. Social: Beth Fox  reports that she has never smoked. She has never used smokeless tobacco. She reports that she does not drink alcohol and  does not use drugs. 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 Surgical: Beth Fox  has a past surgical history that includes Breast surgery; Tubal ligation; Appendectomy; Mastectomy (Right, Oct 1997);  Reduction mammaplasty (Left, 2012); and Foot surgery (Left, 06/2018). Family: family history includes Arthritis in her sister and sister; Breast cancer in her cousin, cousin, maternal aunt, and paternal aunt; Cancer in her father; Diabetes in her father and mother; Heart attack in her paternal grandfather; Hypertension in her father, mother, sister, and sister; Parkinsonism in her father; Stroke in her maternal grandmother, mother, and paternal grandmother.  Laboratory Chemistry Profile   Renal Lab Results  Component Value Date   BUN 21 12/02/2022   CREATININE 1.11 12/02/2022   GFR 50.29 (L) 12/02/2022   GFRNONAA 77.07 08/27/2010    Hepatic Lab Results  Component Value Date   AST 15 12/02/2022   ALT 15 12/02/2022   ALBUMIN 4.3 12/02/2022   ALKPHOS 88 12/02/2022   HCVAB NEGATIVE 08/02/2015   LIPASE 26.0 02/09/2017    Electrolytes Lab Results  Component Value Date   NA 142 12/02/2022   K 3.8 12/02/2022   CL 100 12/02/2022   CALCIUM 10.1 12/02/2022    Bone Lab Results  Component Value Date   VD25OH 24.69 (L) 12/02/2022    Inflammation (CRP: Acute Phase) (ESR: Chronic Phase) Lab Results  Component Value Date   ESRSEDRATE 30 (H) 01/16/2014         Note: Above Lab results reviewed.    Physical Exam  General appearance: Well nourished, well developed, and well hydrated. In no apparent acute distress Mental status: Alert, oriented x 3 (person, place, & time)       Respiratory: No evidence of acute respiratory distress Eyes: PERLA Vitals: BP (!) 140/86   Pulse 68   Temp (!) 97 F (36.1 C) (Temporal)   Resp 14   Ht 5\' 3"  (1.6 m)   Wt 155 lb (70.3 kg)   SpO2 100%   BMI 27.46 kg/m  BMI: Estimated body mass index is 27.46 kg/m as calculated from the following:   Height as of this encounter: 5\' 3"  (1.6 m).   Weight as of this encounter: 155 lb (70.3 kg). Ideal: Ideal body weight: 52.4 kg (115 lb 8.3 oz) Adjusted ideal body weight: 59.6 kg (131 lb 5 oz)  Lumbar  Spine Area Exam  Skin & Axial Inspection: No masses, redness, or swelling Alignment: Symmetrical Functional ROM: Pain restricted ROM       Stability: No instability detected Muscle Tone/Strength: Functionally intact. No obvious neuro-muscular anomalies detected. Sensory (Neurological): Musculoskeletal pain pattern Palpation: Complains of area being tender to palpation       Provocative Tests:  Patrick's Maneuver: (+) for right-sided S-I arthralgia             FABER* test: (+) for right-sided S-I arthralgia             S-I anterior distraction/compression test: (+) for right-sided S-I arthralgia             S-I lateral compression test: (+) for right-sided S-I arthralgia             S-I Thigh-thrust test:(+) for right-sided S-I arthralgia             S-I Gaenslen's test: (+) for right-sided S-I arthralgia             *(Flexion, ABduction and External Rotation) Gait &  Posture Assessment  Ambulation: Unassisted Gait: Relatively normal for age and body habitus Posture: WNL  Lower Extremity Exam    Side: Right lower extremity  Side: Left lower extremity  Stability: No instability observed          Stability: No instability observed          Skin & Extremity Inspection: Skin color, temperature, and hair growth are WNL. No peripheral edema or cyanosis. No masses, redness, swelling, asymmetry, or associated skin lesions. No contractures.  Skin & Extremity Inspection: Skin color, temperature, and hair growth are WNL. No peripheral edema or cyanosis. No masses, redness, swelling, asymmetry, or associated skin lesions. No contractures.  Functional ROM: Decreased ROM                  Functional ROM: Unrestricted ROM                  Muscle Tone/Strength: Functionally intact. No obvious neuro-muscular anomalies detected.  Muscle Tone/Strength: Functionally intact. No obvious neuro-muscular anomalies detected.  Sensory (Neurological): Musculoskeletal pain pattern        Sensory (Neurological):  Unimpaired        DTR: Patellar: deferred today Achilles: deferred today Plantar: deferred today  DTR: Patellar: deferred today Achilles: deferred today Plantar: deferred today  Palpation: No palpable anomalies  Palpation: No palpable anomalies    Assessment   Diagnosis Status  1. SI joint arthritis (HCC)   2. Piriformis syndrome of right side   3. Lumbar facet arthropathy   4. Degeneration of intervertebral disc of lumbar region with discogenic back pain   5. Piriformis syndrome of both sides    Persistent Persistent Persistent     Plan of Care  Positive response to diagnostic lumbar facet medial branch nerve blocks.  Discussed repeating lumbar facet medial branch nerve block and then considering RFA versus Sprint peripheral nerve stimulation of the medial branch nerve.  Patient prefers to avoid an ablative procedure and would like to go the stimulation route.  We discussed what L4 medial branch stimulation would entail.  I explained to pt how Sprint peripheral nerve stimulation (PNS)  is typically considered for patients with chronic, localized pain that is not responding to conservative treatments such as medications, physical therapy, or injections.  The SPRINT peripheral nerve stimulator is designed for short-term, percutaneous use (approximately 60 days) to modulate pain through targeted nerve stimulation. Unlike traditional permanent implants, SPRINT is temporary but can lead to long-lasting pain relief by altering pain signals.  We discussed the risks and benefits of peripheral nerve stimulation. Benefits: minimally invasive, does not require permanent implantation, can offer significant pain relief, improving function and quality of life, may reduce the need for long-term opioid use. The risks/challenges include (but not limited to):  infection or irritation at the stimulation site, discomfort from electrode placement, risk of incomplete pain relief or temporary relief  post-removal, limited to short-term therapy, which may be a disadvantage in chronic, refractory cases  Right SI joint and right piriformis pain: Discussed diagnostic right SI joint and right piriformis injection.  Risks and benefits reviewed and patient would like to proceed.  Orders:  Orders Placed This Encounter  Procedures   Implantable Peripheral Nerve Stimulator    Standing Status:   Future    Standing Expiration Date:   12/09/2023    Scheduling Instructions:     Procedure: Temporary implantable peripheral nerve stimulator     Side:  Bilateral L4     Nerve site: medial  branch     Sedation: Patient's choice.     Timeframe: ASAA    Order Specific Question:   Location of Procedure    Answer:   [facility]   SACROILIAC JOINT INJECTION    Standing Status:   Future    Standing Expiration Date:   12/09/2023    Scheduling Instructions:     Side: RIGHT SI-J with IV Versed     Timeframe: ASAP    Order Specific Question:   Where will this procedure be performed?    Answer:   [facility]   TRIGGER POINT INJECTION    Area: Buttocks region (gluteal area) Indications: Piriformis muscle pain; Right (G57.01) piriformis-syndrome; piriformis muscle spasms (Z30.865). CPT code: 78469    Scheduling Instructions:     Type: Myoneural block (TPI) of piriformis muscle.     Side:  RIGHT     Sedation: with IV Versed     Timeframe: Today    Order Specific Question:   Where will this procedure be performed?    Answer:   [facility]   Follow-up plan:   Return in about 26 days (around 10/04/2023) for Right L4 PNS (SPRINT) + Right SI-J, Right Piriformis TPI (40 mins), in clinic IV Versed.      Recent Visits Date Type Provider Dept  08/25/23 Procedure visit Edward Jolly, MD [facility]  07/27/23 Office Visit Edward Jolly, MD [facility]  Showing recent visits within past 90 days and meeting all other requirements Today's Visits Date Type  Provider Dept  09/08/23 Office Visit Edward Jolly, MD [facility]  Showing today's visits and meeting all other requirements Future Appointments No visits were found meeting these conditions. Showing future appointments within next 90 days and meeting all other requirements  I discussed the assessment and treatment plan with the patient. The patient was provided an opportunity to ask questions and all were answered. The patient agreed with the plan and demonstrated an understanding of the instructions.  Patient advised to call back or seek an in-person evaluation if the symptoms or condition worsens.  Duration of encounter: .  Total time on encounter, as per AMA guidelines included both the face-to-face and non-face-to-face time personally spent by the physician and/or other qualified health care professional(s) on the day of the encounter (includes time in activities that require the physician or other qualified health care professional and does not include time in activities normally performed by clinical staff). Physician's time may include the following activities when performed: Preparing to see the patient (e.g., pre-charting review of records, searching for previously ordered imaging, lab work, and nerve conduction tests) Review of prior analgesic pharmacotherapies. Reviewing PMP Interpreting ordered tests (e.g., lab work, imaging, nerve conduction tests) Performing post-procedure evaluations, including interpretation of diagnostic procedures Obtaining and/or reviewing separately obtained history Performing a medically appropriate examination and/or evaluation Counseling and educating the patient/family/caregiver Ordering medications, tests, or procedures Referring and communicating with other health care professionals (when not separately reported) Documenting clinical information in the electronic or other health record Independently interpreting results (not  separately reported) and communicating results to the patient/ family/caregiver Care coordination (not separately reported)  Note by: Edward Jolly, MD Date: 09/08/2023; Time: 4:36 PM

## 2023-09-15 ENCOUNTER — Other Ambulatory Visit: Payer: Self-pay | Admitting: Family

## 2023-09-25 ENCOUNTER — Other Ambulatory Visit: Payer: Self-pay | Admitting: Family Medicine

## 2023-09-28 ENCOUNTER — Telehealth: Payer: Self-pay

## 2023-09-28 DIAGNOSIS — I10 Essential (primary) hypertension: Secondary | ICD-10-CM | POA: Insufficient documentation

## 2023-09-28 NOTE — Telephone Encounter (Signed)
Writing to find out the status of patients referral to GYN.  Referral made 09/02/23. Patent wrote "Still waiting for referral with a new obgyn. I must have fell through the cracks, so to speak."

## 2023-09-30 ENCOUNTER — Telehealth: Payer: Self-pay | Admitting: Student in an Organized Health Care Education/Training Program

## 2023-09-30 NOTE — Telephone Encounter (Signed)
PT stated that she has to be admitted into hospital on Tuesday to get some release from the back pain. PT stated that she was release on 09-30-23 FYI

## 2023-10-01 NOTE — Telephone Encounter (Signed)
Noted  

## 2023-10-04 ENCOUNTER — Encounter: Payer: Self-pay | Admitting: Student in an Organized Health Care Education/Training Program

## 2023-10-04 ENCOUNTER — Ambulatory Visit
Payer: Medicare Other | Attending: Student in an Organized Health Care Education/Training Program | Admitting: Student in an Organized Health Care Education/Training Program

## 2023-10-04 ENCOUNTER — Telehealth: Payer: Self-pay

## 2023-10-04 ENCOUNTER — Ambulatory Visit
Admission: RE | Admit: 2023-10-04 | Discharge: 2023-10-04 | Disposition: A | Discharge status: Home / Self Care | Payer: Medicare Other | Source: Ambulatory Visit | Attending: Student in an Organized Health Care Education/Training Program | Admitting: Student in an Organized Health Care Education/Training Program

## 2023-10-04 VITALS — BP 140/78 | HR 70 | Resp 16 | Ht 63.0 in | Wt 152.0 lb

## 2023-10-04 DIAGNOSIS — M47816 Spondylosis without myelopathy or radiculopathy, lumbar region: Secondary | ICD-10-CM | POA: Diagnosis present

## 2023-10-04 DIAGNOSIS — M533 Sacrococcygeal disorders, not elsewhere classified: Secondary | ICD-10-CM | POA: Insufficient documentation

## 2023-10-04 DIAGNOSIS — M1990 Unspecified osteoarthritis, unspecified site: Secondary | ICD-10-CM | POA: Diagnosis not present

## 2023-10-04 DIAGNOSIS — M461 Sacroiliitis, not elsewhere classified: Secondary | ICD-10-CM

## 2023-10-04 DIAGNOSIS — G5701 Lesion of sciatic nerve, right lower limb: Secondary | ICD-10-CM

## 2023-10-04 DIAGNOSIS — M5136 Other intervertebral disc degeneration, lumbar region with discogenic back pain only: Secondary | ICD-10-CM

## 2023-10-04 MED ORDER — METHYLPREDNISOLONE ACETATE 80 MG/ML IJ SUSP
80.0000 mg | Freq: Once | INTRAMUSCULAR | Status: AC
  Administered 2023-10-04: 80 mg via INTRA_ARTICULAR

## 2023-10-04 MED ORDER — ROPIVACAINE HCL 2 MG/ML IJ SOLN
INTRAMUSCULAR | Status: AC
  Filled 2023-10-04: qty 20

## 2023-10-04 MED ORDER — ROPIVACAINE HCL 2 MG/ML IJ SOLN
9.0000 mL | Freq: Once | INTRAMUSCULAR | Status: AC
  Administered 2023-10-04: 9 mL via INTRA_ARTICULAR

## 2023-10-04 MED ORDER — MIDAZOLAM HCL 2 MG/2ML IJ SOLN
INTRAMUSCULAR | Status: AC
Start: 2023-10-04 — End: ?
  Filled 2023-10-04: qty 2

## 2023-10-04 MED ORDER — LIDOCAINE HCL 2 % IJ SOLN
20.0000 mL | Freq: Once | INTRAMUSCULAR | Status: AC
  Administered 2023-10-04: 400 mg

## 2023-10-04 MED ORDER — LACTATED RINGERS IV SOLN: Freq: Once | INTRAVENOUS | Status: AC

## 2023-10-04 MED ORDER — LIDOCAINE HCL 2 % IJ SOLN
INTRAMUSCULAR | Status: AC
Start: 2023-10-04 — End: ?
  Filled 2023-10-04: qty 20

## 2023-10-04 MED ORDER — MIDAZOLAM HCL 2 MG/2ML IJ SOLN
0.5000 mg | Freq: Once | INTRAMUSCULAR | Status: AC
  Administered 2023-10-04: 2 mg via INTRAVENOUS

## 2023-10-04 MED ORDER — METHYLPREDNISOLONE ACETATE 80 MG/ML IJ SUSP
INTRAMUSCULAR | Status: AC
  Filled 2023-10-04: qty 1

## 2023-10-04 MED ORDER — IOHEXOL 180 MG/ML  SOLN
10.0000 mL | Freq: Once | INTRAMUSCULAR | Status: AC
  Administered 2023-10-04: 10 mL via INTRA_ARTICULAR
  Filled 2023-10-04: qty 20

## 2023-10-04 NOTE — Progress Notes (Signed)
Safety precautions to be maintained throughout the outpatient stay will include: orient to surroundings, keep bed in low position, maintain call bell within reach at all times, provide assistance with transfer out of bed and ambulation.  

## 2023-10-04 NOTE — Progress Notes (Signed)
PROVIDER NOTE: Interpretation of information contained herein should be left to medically-trained personnel. Specific patient instructions are provided elsewhere under "Patient Instructions" section of medical record. This document was created in part using STT-dictation technology, any transcriptional errors that may result from this process are unintentional.  Patient: Beth Fox Type: Established DOB: Jul 05, 1951 MRN: [id number] PCP: Excell Seltzer, MD  Service: Procedure DOS: 10/04/2023 Setting: Ambulatory Location: Ambulatory outpatient facility Delivery: Face-to-face Provider: Edward Jolly, MD Specialty: Interventional Pain Management Specialty designation: 09 Location: Outpatient facility Ref. Prov.: Edward Jolly, MD       Interventional Therapy   Procedure: Sacroiliac Joint Steroid Injection #1 and Right Piriformis TPI #1   Laterality: Right     Level: PIIS (Posterior Inferior Iliac Spine)  Imaging: Fluoroscopic guidance Anesthesia: Local anesthesia (1-2% Lidocaine) Sedation: Minimal Sedation                       DOS: 10/04/2023  Performed by: Edward Jolly, MD  Purpose: Diagnostic/Therapeutic Indications: Sacroiliac joint pain in the lower back and hip area severe enough to impact quality of life or function. Rationale (medical necessity): procedure needed and proper for the diagnosis and/or treatment of [name]'s medical symptoms and needs. 1. SI joint arthritis (HCC)   2. Lumbar facet arthropathy   3. Degeneration of intervertebral disc of lumbar region with discogenic back pain   4. Piriformis syndrome of right side    NAS-11 Pain score:   Pre-procedure: 3 /10   Post-procedure: 0-No pain/10     Target: Interarticular sacroiliac joint. Location: Medial to the postero-medial edge of iliac spine. Region: Lumbosacral-sacrococcygeal. Approach: Inferior postero-medial percutaneous approach. Type of procedure: Percutaneous joint injection.  Position / Prep /  Materials:  Position: Prone  Prep solution: ChloraPrep (2% chlorhexidine gluconate and 70% isopropyl alcohol) Prep Area: Entire posterior lumbosacral area  Materials:  Tray: Block Needle(s):  Type: Spinal  Gauge (G): 22  Length: 3.5-in Qty: 1  H&P (Pre-op Assessment):  [name] is a 72 y.o. (year old), female patient, seen today for interventional treatment. She  has a past surgical history that includes Breast surgery; Tubal ligation; Appendectomy; Mastectomy (Right, Oct 1997); Reduction mammaplasty (Left, 2012); and Foot surgery (Left, 06/2018). [name] has a current medication list which includes the following prescription(s): acetaminophen, vitamin d3, gabapentin, hydrochlorothiazide, hydrocodone-acetaminophen, mometasone, omeprazole, red yeast rice extract, sertraline, tramadol, and trazodone. Her primarily concern today is the Back Pain (Lower right buttock)  Initial Vital Signs:  Pulse/HCG Rate: 70ECG Heart Rate: 71 Temp:   Resp: 18 BP: (!) 156/79 SpO2: 100 %  BMI: Estimated body mass index is 26.93 kg/m as calculated from the following:   Height as of this encounter: 5\' 3"  (1.6 m).   Weight as of this encounter: 152 lb (68.9 kg).  Risk Assessment: Allergies: Reviewed. She has no known allergies.  Allergy Precautions: None required Coagulopathies: Reviewed. None identified.  Blood-thinner therapy: None at this time Active Infection(s): Reviewed. None identified. [name] is afebrile  Site Confirmation: [name] was asked to confirm the procedure and laterality before marking the site Procedure checklist: Completed Consent: Before the procedure and under the influence of no sedative(s), amnesic(s), or anxiolytics, the patient was informed of the treatment options, risks and possible complications. To fulfill our ethical and legal obligations, as recommended by the American Medical Association's Code of Ethics, I have informed the patient of my clinical  impression; the nature and purpose of the treatment or procedure; the risks,  benefits, and possible complications of the intervention; the alternatives, including doing nothing; the risk(s) and benefit(s) of the alternative treatment(s) or procedure(s); and the risk(s) and benefit(s) of doing nothing. The patient was provided information about the general risks and possible complications associated with the procedure. These may include, but are not limited to: failure to achieve desired goals, infection, bleeding, organ or nerve damage, allergic reactions, paralysis, and death. In addition, the patient was informed of those risks and complications associated to the procedure, such as failure to decrease pain; infection; bleeding; organ or nerve damage with subsequent damage to sensory, motor, and/or autonomic systems, resulting in permanent pain, numbness, and/or weakness of one or several areas of the body; allergic reactions; (i.e.: anaphylactic reaction); and/or death. Furthermore, the patient was informed of those risks and complications associated with the medications. These include, but are not limited to: allergic reactions (i.e.: anaphylactic or anaphylactoid reaction(s)); adrenal axis suppression; blood sugar elevation that in diabetics may result in ketoacidosis or comma; water retention that in patients with history of congestive heart failure may result in shortness of breath, pulmonary edema, and decompensation with resultant heart failure; weight gain; swelling or edema; medication-induced neural toxicity; particulate matter embolism and blood vessel occlusion with resultant organ, and/or nervous system infarction; and/or aseptic necrosis of one or more joints. Finally, the patient was informed that Medicine is not an exact science; therefore, there is also the possibility of unforeseen or unpredictable risks and/or possible complications that may result in a catastrophic outcome. The patient  indicated having understood very clearly. We have given the patient no guarantees and we have made no promises. Enough time was given to the patient to ask questions, all of which were answered to the patient's satisfaction. Ms. Mousseau has indicated that she wanted to continue with the procedure. Attestation: I, the ordering provider, attest that I have discussed with the patient the benefits, risks, side-effects, alternatives, likelihood of achieving goals, and potential problems during recovery for the procedure that I have provided informed consent. Date  Time: 10/04/2023 10:47 AM  Pre-Procedure Preparation:  Monitoring: As per clinic protocol. Respiration, ETCO2, SpO2, BP, heart rate and rhythm monitor placed and checked for adequate function Safety Precautions: Patient was assessed for positional comfort and pressure points before starting the procedure. Time-out: I initiated and conducted the "Time-out" before starting the procedure, as per protocol. The patient was asked to participate by confirming the accuracy of the "Time Out" information. Verification of the correct person, site, and procedure were performed and confirmed by me, the nursing staff, and the patient. "Time-out" conducted as per Joint Commission's Universal Protocol (UP.01.01.01). Time: 1112 Start Time: 1112 hrs.  Description/Narrative of Procedure:          Start Time: 1112 hrs.  Rationale (medical necessity): procedure needed and proper for the diagnosis and/or treatment of the patient's medical symptoms and needs. Procedural Technique Safety Precautions: Aspiration looking for blood return was conducted prior to all injections. At no point did we inject any substances, as a needle was being advanced. No attempts were made at seeking any paresthesias. Safe injection practices and needle disposal techniques used. Medications properly checked for expiration dates. SDV (single dose vial) medications used. Description of the  Procedure: Protocol guidelines were followed. The patient was assisted into a comfortable position. The target area was identified and the area prepped in the usual manner. Skin & deeper tissues infiltrated with local anesthetic. Appropriate amount of time allowed to pass for local anesthetics to take  effect. The procedure needles were then advanced to the target area. Proper needle placement secured. Negative aspiration confirmed. Solution injected in intermittent fashion, asking for systemic symptoms every 0.5cc of injectate. The needles were then removed and the area cleansed, making sure to leave some of the prepping solution back to take advantage of its long term bactericidal properties.  Technical description of procedure:  Fluoroscopy using a posterior anterior 45 degree angle from the midline aiming at the anterolateral aspect of the patient was used to find a direct path into the sacroiliac joint, the superior medial to posterior superior iliac spine.  The skin was marked where the desired target and the skin infiltrated with local anesthetics.  The procedure needle was then advanced until the joint was entered.  Once inside of the joint, we then proceeded to inject the desired solution.  10 cc solution made of 9cc of 0.2% ropivacaine, 1 cc of methylprednisolone, 80 mg/cc. 5 cc injected into the right SI joint  A lright piriformis injection was also performed 1 cm inferior, 1 cm lateral and 1 cm deep to the inferior sacroiliac fissure with contrast highlighting piriformis muscle striations.  5 cc (of above solution) was injected into the right  piriformis muscle under fluoroscopy.     Vitals:   10/04/23 1125 10/04/23 1130 10/04/23 1135 10/04/23 1146  BP: (!) 143/80 (!) 147/89 (!) 147/89 (!) 140/78  Pulse:      Resp: 18 15 18 16   SpO2: 95% 97% 95% 95%  Weight:      Height:         End Time: 1035 hrs.  Imaging Guidance (Non-Spinal):          Type of Imaging Technique: Fluoroscopy  Guidance (Non-Spinal) Indication(s): Fluoroscopy guidance for needle placement to enhance accuracy in procedures requiring precise needle localization for targeted delivery of medication in or near specific anatomical locations not easily accessible without such real-time imaging assistance. Exposure Time: Please see nurses notes. Contrast: Before injecting any contrast, we confirmed that the patient did not have an allergy to iodine, shellfish, or radiological contrast. Once satisfactory needle placement was completed at the desired level, radiological contrast was injected. Contrast injected under live fluoroscopy. No contrast complications. See chart for type and volume of contrast used. Fluoroscopic Guidance: I was personally present during the use of fluoroscopy. "Tunnel Vision Technique" used to obtain the best possible view of the target area. Parallax error corrected before commencing the procedure. "Direction-depth-direction" technique used to introduce the needle under continuous pulsed fluoroscopy. Once target was reached, antero-posterior, oblique, and lateral fluoroscopic projection used confirm needle placement in all planes. Images permanently stored in EMR. Interpretation: I personally interpreted the imaging intraoperatively. Adequate needle placement confirmed in multiple planes. Appropriate spread of contrast into desired area was observed. No evidence of afferent or efferent intravascular uptake. Permanent images saved into the patient's record.  Post-operative Assessment:  Post-procedure Vital Signs:  Pulse/HCG Rate: 7075 Temp:   Resp: 16 BP: (!) 140/78 SpO2: 95 %  EBL: None  Complications: No immediate post-treatment complications observed by team, or reported by patient.  Note: The patient tolerated the entire procedure well. A repeat set of vitals were taken after the procedure and the patient was kept under observation following institutional policy, for this type of  procedure. Post-procedural neurological assessment was performed, showing return to baseline, prior to discharge. The patient was provided with post-procedure discharge instructions, including a section on how to identify potential problems. Should any problems arise concerning  this procedure, the patient was given instructions to immediately contact us, at any time, without hesitation. In any case, we plan to contact the patient by telephone for a follow-up status report regarding this interventional procedure.  Comments:  No additional relevant information.  Plan of Care (POC)  Orders:  Orders Placed This Encounter  Procedures   DG PAIN CLINIC C-ARM 1-60 MIN NO REPORT    Intraoperative interpretation by procedural physician at [facility].    Standing Status:   Standing    Number of Occurrences:   1    Reason for exam::   Assistance in needle guidance and placement for procedures requiring needle placement in or near specific anatomical locations not easily accessible without such assistance.    Medications ordered for procedure: Meds ordered this encounter  Medications   iohexol (OMNIPAQUE) 180 MG/ML injection 10 mL    Must be Myelogram-compatible. If not available, you may substitute with a water-soluble, non-ionic, hypoallergenic, myelogram-compatible radiological contrast medium.   lidocaine (XYLOCAINE) 2 % (with pres) injection 400 mg   lactated ringers infusion   midazolam (VERSED) injection 0.5-2 mg    Make sure Flumazenil is available in the pyxis when using this medication. If oversedation occurs, administer 0.2 mg IV over 15 sec. If after 45 sec no response, administer 0.2 mg again over 1 min; may repeat at 1 min intervals; not to exceed 4 doses (1 mg)   methylPREDNISolone acetate (DEPO-MEDROL) injection 80 mg   ropivacaine (PF) 2 mg/mL (0.2%) (NAROPIN) injection 9 mL   Medications administered: We administered iohexol, lidocaine, lactated ringers, midazolam,  methylPREDNISolone acetate, and ropivacaine (PF) 2 mg/mL (0.2%).  See the medical record for exact dosing, route, and time of administration.  Follow-up plan:   Return in about 30 days (around 11/03/2023) for PPE in person.       Recent Visits Date Type Provider Dept  09/08/23 Office Visit Edward Jolly, MD [facility]  08/25/23 Procedure visit Edward Jolly, MD [facility]  07/27/23 Office Visit Edward Jolly, MD [facility]  Showing recent visits within past 90 days and meeting all other requirements Today's Visits Date Type Provider Dept  10/04/23 Procedure visit Edward Jolly, MD [facility]  Showing today's visits and meeting all other requirements Future Appointments Date Type Provider Dept  11/03/23 Appointment Edward Jolly, MD [facility]  Showing future appointments within next 90 days and meeting all other requirements  Disposition: Discharge home  Discharge (Date  Time): 10/04/2023; 1210 hrs.   Primary Care Physician: Excell Seltzer, MD Location: [facility] Note by: Edward Jolly, MD (TTS technology used. I apologize for any typographical errors that were not detected and corrected.) Date: 10/04/2023; Time: 2:15 PM  Disclaimer:  Medicine is not an Visual merchandiser. The only guarantee in medicine is that nothing is guaranteed. It is important to note that the decision to proceed with this intervention was based on the information collected from the patient. The Data and conclusions were drawn from the patient's questionnaire, the interview, and the physical examination. Because the information was provided in large part by the patient, it cannot be guaranteed that it has not been purposely or unconsciously manipulated. Every effort has been made to obtain as much relevant data as possible for this evaluation. It is important to note that the conclusions that lead to this procedure are derived in  large part from the available data. Always take into account that the treatment will  also be dependent on availability of resources and existing treatment guidelines, considered by other Pain Management Practitioners as being common knowledge and practice, at the time of the intervention. For Medico-Legal purposes, it is also important to point out that variation in procedural techniques and pharmacological choices are the acceptable norm. The indications, contraindications, technique, and results of the above procedure should only be interpreted and judged by a Board-Certified Interventional Pain Specialist with extensive familiarity and expertise in the same exact procedure and technique.

## 2023-10-04 NOTE — Progress Notes (Signed)
PROVIDER NOTE: Interpretation of information contained herein should be left to medically-trained personnel. Specific patient instructions are provided elsewhere under "Patient Instructions" section of medical record. This document was created in part using STT-dictation technology, any transcriptional errors that may result from this process are unintentional.  Patient: Beth Fox Type: Established DOB: 1951-01-15 MRN: [id number] PCP: Excell Seltzer, MD  Service: Procedure DOS: 10/04/2023 Setting: Ambulatory Location: Ambulatory outpatient facility Delivery: Face-to-face Provider: Edward Jolly, MD Specialty: Interventional Pain Management Specialty designation: 09 Location: Outpatient facility Ref. Prov.: Edward Jolly, MD       Interventional Therapy   Primary Reason for Admission: Surgical management of chronic pain condition.  Procedure:  Anesthesia, Analgesia, Anxiolysis:  Type: SPRINTTM Peripheral Nerve Field Stimulator (PNS) MicroLeadTM Implant Purpose: Therapeutic Region: Lumbar Level: L4-5 Facet Medial Branch Nerve Laterality: Right           Anesthesia: Local (1-2% Lidocaine)  Anxiolysis: None  Sedation: Minimal  Guidance: Fluoroscopy & [place] CPT (56433)    Lumbar facet arthropathy  NAS-11 Pain score:   Pre-procedure: 3 /10   Post-procedure: 0-No pain/10   H&P (Pre-op Assessment):  Beth Fox is a 72 y.o. (year old), female patient, seen today for interventional treatment. She  has a past surgical history that includes Breast surgery; Tubal ligation; Appendectomy; Mastectomy (Right, Oct 1997); Reduction mammaplasty (Left, 2012); and Foot surgery (Left, 06/2018).  Initial Vital Signs:  Pulse/EKG Rate: 70ECG Heart Rate: 71 Temp:   Resp: 18 BP: (!) 156/79 SpO2: 100 %  BMI: Estimated body mass index is 26.93 kg/m as calculated from the following:   Height as of this encounter: 5\' 3"  (1.6 m).   Weight as of this encounter: 152 lb (68.9 kg).  Risk  Assessment: Allergies: Reviewed. She has no known allergies.  Allergy Precautions: None required Coagulopathies: Reviewed. None identified.  Blood-thinner therapy: None at this time Active Infection(s): Reviewed. None identified. Beth Fox is afebrile  Site Confirmation: Beth Fox was asked to confirm the procedure and laterality before marking the site, which she did. Procedure checklist: Completed Consent: Before the procedure and under the influence of no sedative(s), amnesic(s), or anxiolytics, the patient was informed of the treatment options, risks and possible complications. To fulfill our ethical and legal obligations, as recommended by the American Medical Association's Code of Ethics, I have informed the patient of my clinical impression; the nature and purpose of the treatment or procedure; the risks, benefits, and possible complications of the intervention; the alternatives, including doing nothing; the risk(s) and benefit(s) of the alternative treatment(s) or procedure(s); and the risk(s) and benefit(s) of doing nothing.  Beth Fox was provided with information about the general risks and possible complications associated with most interventional procedures. These include, but are not limited to: failure to achieve desired goals, infection, bleeding, organ or nerve damage, allergic reactions, paralysis, and/or death.  In addition, she was informed of those risks and possible complications associated to this particular procedure, which include, but are not limited to: damage to the implant; failure to decrease pain; local, systemic, or serious CNS infections, intraspinal abscess with possible cord compression and paralysis, or life-threatening such as meningitis; intrathecal and/or epidural bleeding with formation of hematoma with possible spinal cord compression and permanent paralysis; organ damage; nerve injury or damage with subsequent sensory, motor, and/or autonomic system  dysfunction, resulting in transient or permanent pain, numbness, and/or weakness of one or several areas of the body; allergic reactions, either minor or major life-threatening, such as anaphylactic or anaphylactoid  reactions.  Furthermore, Beth Fox was informed of those risks and complications associated with the medications. These include, but are not limited to: allergic reactions (i.e.: anaphylactic or anaphylactoid reactions); arrhythmia;  Hypotension/hypertension; cardiovascular collapse; respiratory depression and/or shortness of breath; swelling or edema; medication-induced neural toxicity; particulate matter embolism and blood vessel occlusion with resultant organ, and/or nervous system infarction and permanent paralysis.  Finally, she was informed that Medicine is not an exact science; therefore, there is also the possibility of unforeseen or unpredictable risks and/or possible complications that may result in a catastrophic outcome. The patient indicated having understood very clearly. We have given the patient no guarantees and we have made no promises. Enough time was given to the patient to ask questions, all of which were answered to the patient's satisfaction. Beth Fox has indicated that she wanted to continue with the procedure. Attestation: I, the ordering provider, attest that I have discussed with the patient the benefits, risks, side-effects, alternatives, likelihood of achieving goals, and potential problems during recovery for the procedure that I have provided informed consent. Date  Time: 10/04/2023 10:47 AM  Pre-Procedure Preparation:  Monitoring: As per clinic protocol. Respiration, ETCO2, SpO2, BP, heart rate and rhythm monitor placed and checked for adequate function Safety Precautions: Patient was assessed for positional comfort and pressure points before starting the procedure. Time-out: I initiated and conducted the "Time-out" before starting the procedure, as per  protocol. The patient was asked to participate by confirming the accuracy of the "Time Out" information. Verification of the correct person, site, and procedure were performed and confirmed by me, the nursing staff, and the patient. "Time-out" conducted as per Joint Commission's Universal Protocol (UP.01.01.01). Time: 1112 Start Time: 1112 hrs.  Description of Procedure Process:   Position: Prone Target Area: <1 cm from targeted nerve (Medial Branch Nerve) Approach: Posterior percutaneous, paramedial, interlaminar approach Area Prepped: Entire Lumbosacral Region Prepping solution: ChloraPrep (2% chlorhexidine gluconate and 70% isopropyl alcohol) Safety Precautions: Safe injection practices and needle disposal techniques used. Medications properly checked for expiration dates. SDV (single dose vial) medications used. Aspiration looking for blood return and/or CSF was conducted prior to all injections. At no point did I inject any substances, as a needle was being advanced. No attempts were made at seeking any paresthesias.  Description of the Procedure (Lumbar Medial Branch):  Availability of a responsible, adult driver, and NPO status confirmed. Informed consent was obtained after having discussed risks and possible complications. An IV was started. The patient was then taken to the fluoroscopy suite, where the patient was placed in position for the procedure, over the fluoroscopy table. The patient was then monitored in the usual manner. Fluoroscopy was manipulated to obtain the best possible view of the target. Parallex error was corrected before commencing the procedure. Once a clear view of the target had been obtained, the skin and subcutaneous tissue over the procedure site were infiltrated using lidocaine, loaded in a 10 cc luer-loc syringe with a 0.5 inch, 25-G needle. Care was taken to avoid numbing deeper tissues The introducer needle(s) was/were then inserted through the skin and deeper  tissues, specifically the multifidus muscle.  An introducer needle and stimulating probe were  assembled, inserted and advanced along the intended course of the medial branch nerve as it traverses the lamina medial and inferior to the zygapophyseal joint, taking care to maintain the proper depth of insertion as the introducer is advanced under fluoroscopic. The introducer needle was delivered to a location in proximity to the  nerve. Multiple stimulation parameters were used to deliver stimulation to the medial branch nerve in concert with stimulating at multiple positions around the nerve. Nerve target acquisition was confirmed noting generation of paresthesias in the paravertebral regions corresponding to the level being stimulated as well as rhythmic thumping within the multifidi, the latter being further corroborated via palpation.  Various electrical parameter combinations were tested, and the lead location was adjusted (physically relocated) until the patient indicated paresthesia or muscle tension overlapping the distribution of the patient's typical region of pain.  The stimulating probe was removed from the introducer and a percutaneous lead was guided through the needle and delivered to a location in similar proximity to the nerve. Final  location was verified with electrical stimulation and documented with fluoroscopy & ultrasound.  The introducer needle was removed, and the exposed end of the percutaneous lead was attached to an external stimulator unit. Various electrical parameter combinations were again  tested until the patient indicated paresthesia or muscle tension  overlapping the distribution of the patient's typical region of pain.  After confirming that lead impedance was in the normal range, the external unit was detached, the needle was removed, and the lead was anchored at the skin. The lead was threaded into the connector block and electrical continuity and desired patient response  was confirmed. The connector block was attached to the external  stimulator unit.The site was covered with a sterile occlusive dressing and  a fluoroscopic and ultrasound image was taken to document final placement. The patient was observed for stability of vital signs and comfort.  Vitals:   10/04/23 1125 10/04/23 1130 10/04/23 1135 10/04/23 1146  BP: (!) 143/80 (!) 147/89 (!) 147/89 (!) 140/78  Pulse:      Resp: 18 15 18 16   SpO2: 95% 97% 95% 95%  Weight:      Height:       Start Time: 1112 hrs. End Time: 1035 hrs.  Imaging Guidance (Spinal):          Type of Imaging Technique: Fluoroscopy Guidance (Spinal) and ultrasound guidance to confirm multifidus activation Indication(s): Fluoroscopy guidance for needle placement to enhance accuracy in procedures requiring precise needle localization for targeted delivery of medication in or near specific anatomical locations not easily accessible without such real-time imaging assistance. Exposure Time: Please see nurses notes. Contrast: None used. Fluoroscopic Guidance: I was personally present during the use of fluoroscopy. "Tunnel Vision Technique" used to obtain the best possible view of the target area. Parallax error corrected before commencing the procedure. "Direction-depth-direction" technique used to introduce the needle under continuous pulsed fluoroscopy. Once target was reached, antero-posterior, oblique, and lateral fluoroscopic projection used confirm needle placement in all planes. Images permanently stored in EMR. Interpretation: No contrast injected. I personally interpreted the imaging intraoperatively. Adequate needle placement confirmed in multiple planes. Permanent images saved into the patient's record.  Antibiotic Prophylaxis:   Anti-infectives (From admission, onward)    None      Indication(s): None identified  Post-operative Assessment:  Post-procedure Vital Signs:  Pulse/HCG Rate: 7075 Temp:   Resp: 16 BP: (!)  140/78 SpO2: 95 %  Complications: No immediate post-treatment complications observed by team, or reported by patient.  Note: The patient tolerated the entire procedure well. A repeat set of vitals were taken after the procedure and the patient was kept under observation following institutional policy, for this type of procedure. Post-procedural neurological assessment was performed, showing return to baseline, prior to discharge. The patient was provided with  post-procedure discharge instructions, including a section on how to identify potential problems. Should any problems arise concerning this procedure, the patient was given instructions to immediately contact us, at any time, without hesitation. In any case, we plan to contact the patient by telephone for a follow-up status report regarding this interventional procedure.  Comments:  No additional relevant information.  Plan of Care  Orders:  Orders Placed This Encounter  Procedures   DG PAIN CLINIC C-ARM 1-60 MIN NO REPORT    Intraoperative interpretation by procedural physician at [facility].    Standing Status:   Standing    Number of Occurrences:   1    Reason for exam::   Assistance in needle guidance and placement for procedures requiring needle placement in or near specific anatomical locations not easily accessible without such assistance.    Medications administered: We administered iohexol, lidocaine, lactated ringers, midazolam, methylPREDNISolone acetate, and ropivacaine (PF) 2 mg/mL (0.2%).  See the medical record for exact dosing, route, and time of administration.  Follow-up plan:   Return in about 30 days (around 11/03/2023) for PPE in person.       Right SI joint, right piriformis, right L4 medial branch peripheral nerve stimulator 10/04/2023    Recent Visits Date Type Provider Dept  09/08/23 Office Visit Edward Jolly, MD [facility]  08/25/23 Procedure visit Edward Jolly, MD [facility]  07/27/23 Office Visit Edward Jolly, MD [facility]  Showing recent visits within past 90 days and meeting all other requirements Today's Visits Date Type Provider Dept  10/04/23 Procedure visit Edward Jolly, MD [facility]  Showing today's visits and meeting all other requirements Future Appointments Date Type Provider Dept  11/03/23 Appointment Edward Jolly, MD [facility]  Showing future appointments within next 90 days and meeting all other requirements  Disposition: Discharge home  Discharge (Date  Time): 10/04/2023; 1210 hrs.   Primary Care Physician: Excell Seltzer, MD Location: [facility] Note by: Edward Jolly, MD (TTS technology used. I apologize for any typographical errors that were not detected and corrected.) Date: 10/04/2023; Time: 2:18 PM

## 2023-10-04 NOTE — Patient Instructions (Signed)

## 2023-10-04 NOTE — Telephone Encounter (Signed)
Called patient to schedule new patient appointment. Left voicemail with our contact information to call back and schedule.  

## 2023-10-05 ENCOUNTER — Telehealth: Payer: Self-pay

## 2023-10-05 NOTE — Telephone Encounter (Signed)
Called PP. No answer. Left message to call if needed and if it was emergency to always go to the ED

## 2023-10-05 NOTE — Telephone Encounter (Signed)
Called patient since mychart message had not been read.  Pt reported that she had been to the hospital at that she has been seen by GYN.

## 2023-10-14 ENCOUNTER — Inpatient Hospital Stay: Payer: Medicare Other | Admitting: Family Medicine

## 2023-11-03 ENCOUNTER — Ambulatory Visit
Payer: Medicare Other | Attending: Student in an Organized Health Care Education/Training Program | Admitting: Student in an Organized Health Care Education/Training Program

## 2023-11-03 ENCOUNTER — Encounter: Payer: Self-pay | Admitting: Student in an Organized Health Care Education/Training Program

## 2023-11-03 VITALS — BP 148/83 | HR 79 | Temp 97.9°F | Resp 14 | Ht 63.0 in | Wt 150.0 lb

## 2023-11-03 DIAGNOSIS — G894 Chronic pain syndrome: Secondary | ICD-10-CM | POA: Diagnosis present

## 2023-11-03 DIAGNOSIS — M47816 Spondylosis without myelopathy or radiculopathy, lumbar region: Secondary | ICD-10-CM | POA: Insufficient documentation

## 2023-11-03 DIAGNOSIS — M461 Sacroiliitis, not elsewhere classified: Secondary | ICD-10-CM | POA: Insufficient documentation

## 2023-11-03 DIAGNOSIS — G5701 Lesion of sciatic nerve, right lower limb: Secondary | ICD-10-CM | POA: Diagnosis not present

## 2023-11-03 DIAGNOSIS — M5136 Other intervertebral disc degeneration, lumbar region with discogenic back pain only: Secondary | ICD-10-CM | POA: Insufficient documentation

## 2023-11-03 NOTE — Patient Instructions (Signed)

## 2023-11-03 NOTE — Progress Notes (Signed)
PROVIDER NOTE: Information contained herein reflects review and annotations entered in association with encounter. Interpretation of such information and data should be left to medically-trained personnel. Information provided to patient can be located elsewhere in the medical record under "Patient Instructions". Document created using STT-dictation technology, any transcriptional errors that may result from process are unintentional.    Patient: Beth Fox  Service Category: E/M  Provider: Edward Jolly, MD  DOB: 1951/09/23  DOS: 11/03/2023  Referring Provider: Excell Seltzer, MD  MRN: [id number]  Specialty: Interventional Pain Management  PCP: Excell Seltzer, MD  Type: Established Patient  Setting: Ambulatory outpatient    Location: Office  Delivery: Face-to-face     HPI  Beth Fox, a 73 y.o. year old female, is here today because of her Lumbar facet arthropathy [M47.816]. Beth Fox primary complain today is Back Pain (Right, lower)  Pain Assessment: Severity of Chronic pain is reported as a 3 /10. Location: Back Right, Lower/denies. Onset: More than a month ago. Quality: Aching. Timing: Intermittent. Modifying factor(s): sitting. Vitals:  height is 5\' 3"  (1.6 m) and weight is 150 lb (68 kg). Her temporal temperature is 97.9 F (36.6 C). Her blood pressure is 148/83 (abnormal) and her pulse is 79. Her respiration is 14 and oxygen saturation is 98%.  BMI: Estimated body mass index is 26.57 kg/m as calculated from the following:   Height as of this encounter: 5\' 3"  (1.6 m).   Weight as of this encounter: 150 lb (68 kg). Last encounter: 09/08/2023. Last procedure: 10/04/2023.  Reason for encounter: post-procedure evaluation and assessment.    Post-procedure evaluation    Procedure: Sacroiliac Joint Steroid Injection #1 and Right Piriformis TPI #1   Laterality: Right     Level: PIIS (Posterior Inferior Iliac Spine)  Imaging: Fluoroscopic guidance Anesthesia: Local  anesthesia (1-2% Lidocaine) Sedation: Minimal Sedation                       DOS: 10/04/2023  Performed by: Edward Jolly, MD  Purpose: Diagnostic/Therapeutic Indications: Sacroiliac joint pain in the lower back and hip area severe enough to impact quality of life or function. Rationale (medical necessity): procedure needed and proper for the diagnosis and/or treatment of Ms. Higham's medical symptoms and needs. 1. SI joint arthritis (HCC)   2. Lumbar facet arthropathy   3. Degeneration of intervertebral disc of lumbar region with discogenic back pain   4. Piriformis syndrome of right side    NAS-11 Pain score:   Pre-procedure: 3 /10   Post-procedure: 0-No pain/10     Effectiveness:  Initial hour after procedure: 100 %  Subsequent 4-6 hours post-procedure: 100 %  Analgesia past initial 6 hours: 50 %  Ongoing improvement:  Analgesic:  50% Function: [name] reports improvement in function ROM: [name] reports improvement in ROM   PNS in place and doing well.  Patient states that she is utilizing it 10 to 12 hours daily.  Is getting good relief with her low back pain.  Follow-up in 30 days for PNS lead removal.   ROS  Constitutional: Denies any fever or chills Gastrointestinal: No reported hemesis, hematochezia, vomiting, or acute GI distress Musculoskeletal: Denies any acute onset joint swelling, redness, loss of ROM, or weakness Neurological: No reported episodes of acute onset apraxia, aphasia, dysarthria, agnosia, amnesia, paralysis, loss of coordination, or loss of consciousness  Medication Review  Red Yeast Rice Extract, Vitamin D3, acetaminophen, gabapentin, hydrochlorothiazide, mometasone, omeprazole, sertraline, traMADol, and  traZODone  History Review  Allergy: [name] has no known allergies. Drug: [name]  reports no history of drug use. Alcohol:  reports no history of alcohol use. Tobacco:  reports that she has never smoked. She has never used  smokeless tobacco. Social: [name]  reports that she has never smoked. She has never used smokeless tobacco. She reports that she does not drink alcohol and does not use drugs. 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 Surgical: Beth Fox  has a past surgical history that includes Breast surgery; Tubal ligation; Appendectomy; Mastectomy (Right, Oct 1997); Reduction mammaplasty (Left, 2012); and Foot surgery (Left, 06/2018). Family: family history includes Arthritis in her sister and sister; Breast cancer in her cousin, cousin, maternal aunt, and paternal aunt; Cancer in her father; Diabetes in her father and mother; Heart attack in her paternal grandfather; Hypertension in her father, mother, sister, and sister; Parkinsonism in her father; Stroke in her maternal grandmother, mother, and paternal grandmother.  Laboratory Chemistry Profile   Renal Lab Results  Component Value Date   BUN 21 12/02/2022   CREATININE 1.11 12/02/2022   GFR 50.29 (L) 12/02/2022   GFRNONAA 77.07 08/27/2010    Hepatic Lab Results  Component Value Date   AST 15 12/02/2022   ALT 15 12/02/2022   ALBUMIN 4.3 12/02/2022   ALKPHOS 88 12/02/2022   HCVAB NEGATIVE 08/02/2015   LIPASE 26.0 02/09/2017    Electrolytes Lab Results  Component Value Date   NA 142 12/02/2022   K 3.8 12/02/2022   CL 100 12/02/2022   CALCIUM 10.1 12/02/2022    Bone Lab Results  Component Value Date   VD25OH 24.69 (L) 12/02/2022    Inflammation (CRP: Acute Phase) (ESR: Chronic Phase) Lab Results  Component Value Date   ESRSEDRATE 30 (H) 01/16/2014         Note: Above Lab results reviewed.  Recent Imaging Review  DG PAIN CLINIC C-ARM 1-60 MIN NO REPORT Fluoro was used, but no Radiologist interpretation will be provided.  Please refer to "NOTES" tab for provider progress note. Note: Reviewed        Physical Exam  General appearance: Well nourished, well  developed, and well hydrated. In no apparent acute distress Mental status: Alert, oriented x 3 (person, place, & time)       Respiratory: No evidence of acute respiratory distress Eyes: PERLA Vitals: BP (!) 148/83   Pulse 79   Temp 97.9 F (36.6 C) (Temporal)   Resp 14   Ht 5\' 3"  (1.6 m)   Wt 150 lb (68 kg)   SpO2 98%   BMI 26.57 kg/m  BMI: Estimated body mass index is 26.57 kg/m as calculated from the following:   Height as of this encounter: 5\' 3"  (1.6 m).   Weight as of this encounter: 150 lb (68 kg). Ideal: Ideal body weight: 52.4 kg (115 lb 8.3 oz) Adjusted ideal body weight: 58.7 kg (129 lb 5 oz)  Right PNS lead in place, clear, clean, dry, intact Dressing in place  Improvement in right low back pain and right SI joint pain  Assessment   Diagnosis Status  1. Lumbar facet arthropathy   2. Degeneration of intervertebral disc of lumbar region with discogenic back pain   3. SI joint arthritis (HCC)   4. Piriformis syndrome of right side   5. Chronic pain syndrome    Controlled Controlled Controlled   Updated Problems: No  problems updated.  Plan of Care  Follow-up in 4 weeks for PNS lead pull.  Follow-up plan:   Return in about 1 month (around 12/06/2023) for PNS lead pull.      Right SI joint, right piriformis, right L4 medial branch peripheral nerve stimulator 10/04/2023    Recent Visits Date Type Provider Dept  10/04/23 Procedure visit Edward Jolly, MD [facility]  09/08/23 Office Visit Edward Jolly, MD [facility]  08/25/23 Procedure visit Edward Jolly, MD [facility]  Showing recent visits within past 90 days and meeting all other requirements Today's Visits Date Type Provider Dept  11/03/23 Office Visit Edward Jolly, MD [facility]  Showing today's visits and meeting all other requirements Future Appointments Date Type Provider Dept  12/06/23 Appointment Edward Jolly, MD [facility]  Showing  future appointments within next 90 days and meeting all other requirements  I discussed the assessment and treatment plan with the patient. The patient was provided an opportunity to ask questions and all were answered. The patient agreed with the plan and demonstrated an understanding of the instructions.  Patient advised to call back or seek an in-person evaluation if the symptoms or condition worsens.  Duration of encounter: .  Total time on encounter, as per AMA guidelines included both the face-to-face and non-face-to-face time personally spent by the physician and/or other qualified health care professional(s) on the day of the encounter (includes time in activities that require the physician or other qualified health care professional and does not include time in activities normally performed by clinical staff). Physician's time may include the following activities when performed: Preparing to see the patient (e.g., pre-charting review of records, searching for previously ordered imaging, lab work, and nerve conduction tests) Review of prior analgesic pharmacotherapies. Reviewing PMP Interpreting ordered tests (e.g., lab work, imaging, nerve conduction tests) Performing post-procedure evaluations, including interpretation of diagnostic procedures Obtaining and/or reviewing separately obtained history Performing a medically appropriate examination and/or evaluation Counseling and educating the patient/family/caregiver Ordering medications, tests, or procedures Referring and communicating with other health care professionals (when not separately reported) Documenting clinical information in the electronic or other health record Independently interpreting results (not separately reported) and communicating results to the patient/ family/caregiver Care coordination (not separately reported)  Note by: Edward Jolly, MD Date: 11/03/2023; Time: 4:19 PM

## 2023-11-04 DIAGNOSIS — K449 Diaphragmatic hernia without obstruction or gangrene: Secondary | ICD-10-CM | POA: Insufficient documentation

## 2023-11-14 ENCOUNTER — Other Ambulatory Visit: Payer: Self-pay | Admitting: Family Medicine

## 2023-11-15 NOTE — Telephone Encounter (Signed)
 Patient has been scheduled

## 2023-11-15 NOTE — Telephone Encounter (Signed)
Please schedule CPE with fasting labs prior with Dr. Ermalene Searing after 12/15/2023.

## 2023-11-30 ENCOUNTER — Telehealth: Payer: Self-pay | Admitting: *Deleted

## 2023-11-30 DIAGNOSIS — E559 Vitamin D deficiency, unspecified: Secondary | ICD-10-CM

## 2023-11-30 DIAGNOSIS — E7800 Pure hypercholesterolemia, unspecified: Secondary | ICD-10-CM

## 2023-11-30 DIAGNOSIS — R7309 Other abnormal glucose: Secondary | ICD-10-CM

## 2023-11-30 NOTE — Telephone Encounter (Signed)
-----   Message from Lovena Neighbours sent at 11/30/2023  9:05 AM EST ----- Regarding: Labs for Wednesday 2.26.25 Please put physical lab orders in future. Thank you, Denny Peon

## 2023-12-06 ENCOUNTER — Ambulatory Visit: Payer: Medicare Other | Admitting: Student in an Organized Health Care Education/Training Program

## 2023-12-07 ENCOUNTER — Encounter: Payer: Self-pay | Admitting: Student in an Organized Health Care Education/Training Program

## 2023-12-07 ENCOUNTER — Ambulatory Visit
Payer: Medicare Other | Attending: Student in an Organized Health Care Education/Training Program | Admitting: Student in an Organized Health Care Education/Training Program

## 2023-12-07 VITALS — BP 125/70 | HR 86 | Temp 96.9°F | Ht 63.0 in | Wt 150.0 lb

## 2023-12-07 DIAGNOSIS — G5701 Lesion of sciatic nerve, right lower limb: Secondary | ICD-10-CM

## 2023-12-07 DIAGNOSIS — M461 Sacroiliitis, not elsewhere classified: Secondary | ICD-10-CM

## 2023-12-07 DIAGNOSIS — G894 Chronic pain syndrome: Secondary | ICD-10-CM

## 2023-12-07 NOTE — Progress Notes (Signed)
 PROVIDER NOTE: Information contained herein reflects review and annotations entered in association with encounter. Interpretation of such information and data should be left to medically-trained personnel. Information provided to patient can be located elsewhere in the medical record under "Patient Instructions". Document created using STT-dictation technology, any transcriptional errors that may result from process are unintentional.    Patient: Beth Fox  Service Category: E/M  Provider: Edward Jolly, MD  DOB: 02/16/1951  DOS: 12/07/2023  Referring Provider: Excell Seltzer, MD  MRN: [id number]  Specialty: Interventional Pain Management  PCP: Excell Seltzer, MD  Type: Established Patient  Setting: Ambulatory outpatient    Location: Office  Delivery: Face-to-face     HPI  [name], a 73 y.o. year old female, is here today because of her Chronic pain syndrome [G89.4]. [name] primary complain today is Back Pain  Pain Assessment: Severity of Chronic pain is reported as a 4 /10. Location: Back  /Denies. Onset: More than a month ago. Quality: Aching, Burning, Constant. Timing: Constant. Modifying factor(s): laying down, heating pad. Vitals:  height is 5\' 3"  (1.6 m) and weight is 150 lb (68 kg). Her temperature is 96.9 F (36.1 C) (abnormal). Her blood pressure is 125/70 and her pulse is 86. Her oxygen saturation is 96%.  BMI: Estimated body mass index is 26.57 kg/m as calculated from the following:   Height as of this encounter: 5\' 3"  (1.6 m).   Weight as of this encounter: 150 lb (68 kg). Last encounter: 11/03/2023. Last procedure: 10/04/2023.  Reason for encounter: post-procedure evaluation and assessment.  Patient presents today for removal of her peripheral nerve stimulator lead.  She states that she did experience relief for the first month but that her second month the pain relief decreased.  She had her peripheral nerve stimulator placed on 10/04/2023.  She is  experiencing increased right SI joint, right buttock pain.  She previously had a right SI joint injection and piriformis injection on 10/04/2023 that provided her with approximately 60% pain relief for 2 months.  She would like to repeat this injection.    ROS  Constitutional: Denies any fever or chills Gastrointestinal: No reported hemesis, hematochezia, vomiting, or acute GI distress Musculoskeletal:  Right SI joint and right piriformis pain Neurological: No reported episodes of acute onset apraxia, aphasia, dysarthria, agnosia, amnesia, paralysis, loss of coordination, or loss of consciousness  Medication Review  Red Yeast Rice Extract, Vitamin D3, acetaminophen, gabapentin, hydrochlorothiazide, mometasone, omeprazole, sertraline, traMADol, and traZODone  History Review  Allergy: [name] has no known allergies. Drug: [name]  reports no history of drug use. Alcohol:  reports no history of alcohol use. Tobacco:  reports that she has never smoked. She has never used smokeless tobacco. Social: [name]  reports that she has never smoked. She has never used smokeless tobacco. She reports that she does not drink alcohol and does not use drugs. 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 Surgical: [name]  has a past surgical history that includes Breast surgery; Tubal ligation; Appendectomy; Mastectomy (Right, Oct 1997); Reduction mammaplasty (Left, 2012); and Foot surgery (Left, 06/2018). Family: family history includes Arthritis in her sister and sister; Breast cancer in her cousin, cousin, maternal aunt, and paternal aunt; Cancer in her father; Diabetes in her father and mother; Heart attack in her paternal grandfather; Hypertension in her father, mother, sister, and sister; Parkinsonism in her father; Stroke in her  maternal grandmother, mother, and paternal grandmother.  Laboratory Chemistry Profile   Renal Lab  Results  Component Value Date   BUN 21 12/02/2022   CREATININE 1.11 12/02/2022   GFR 50.29 (L) 12/02/2022   GFRNONAA 77.07 08/27/2010    Hepatic Lab Results  Component Value Date   AST 15 12/02/2022   ALT 15 12/02/2022   ALBUMIN 4.3 12/02/2022   ALKPHOS 88 12/02/2022   HCVAB NEGATIVE 08/02/2015   LIPASE 26.0 02/09/2017    Electrolytes Lab Results  Component Value Date   NA 142 12/02/2022   K 3.8 12/02/2022   CL 100 12/02/2022   CALCIUM 10.1 12/02/2022    Bone Lab Results  Component Value Date   VD25OH 24.69 (L) 12/02/2022    Inflammation (CRP: Acute Phase) (ESR: Chronic Phase) Lab Results  Component Value Date   ESRSEDRATE 30 (H) 01/16/2014         Note: Above Lab results reviewed.   Physical Exam  General appearance: Well nourished, well developed, and well hydrated. In no apparent acute distress Mental status: Alert, oriented x 3 (person, place, & time)       Respiratory: No evidence of acute respiratory distress Eyes: PERLA Vitals: BP 125/70   Pulse 86   Temp (!) 96.9 F (36.1 C)   Ht 5\' 3"  (1.6 m)   Wt 150 lb (68 kg)   SpO2 96%   BMI 26.57 kg/m  BMI: Estimated body mass index is 26.57 kg/m as calculated from the following:   Height as of this encounter: 5\' 3"  (1.6 m).   Weight as of this encounter: 150 lb (68 kg). Ideal: Ideal body weight: 52.4 kg (115 lb 8.3 oz) Adjusted ideal body weight: 58.7 kg (129 lb 5 oz)  Lumbar Spine Area Exam  Skin & Axial Inspection: No masses, redness, or swelling Alignment: Symmetrical Functional ROM: Pain restricted ROM       Stability: No instability detected Muscle Tone/Strength: Functionally intact. No obvious neuro-muscular anomalies detected. Sensory (Neurological): Musculoskeletal pain pattern Palpation: Complains of area being tender to palpation       Provocative Tests:   Patrick's Maneuver: (+) for right-sided S-I arthralgia             FABER* test: (+) for right-sided S-I arthralgia             S-I  anterior distraction/compression test: (+) for right-sided S-I arthralgia             S-I lateral compression test: (+) for right-sided S-I arthralgia             S-I Thigh-thrust test:(+) for right-sided S-I arthralgia             S-I Gaenslen's test: (+) for right-sided S-I arthralgia             *(Flexion, ABduction and External Rotation) Gait & Posture Assessment  Ambulation: Unassisted Gait: Relatively normal for age and body habitus Posture: WNL  Lower Extremity Exam      Side: Right lower extremity   Side: Left lower extremity  Stability: No instability observed           Stability: No instability observed          Skin & Extremity Inspection: Skin color, temperature, and hair growth are WNL. No peripheral edema or cyanosis. No masses, redness, swelling, asymmetry, or associated skin lesions. No contractures.   Skin & Extremity Inspection: Skin color, temperature, and hair growth are WNL. No peripheral edema or cyanosis.  No masses, redness, swelling, asymmetry, or associated skin lesions. No contractures.  Functional ROM: Decreased ROM                   Functional ROM: Unrestricted ROM                  Muscle Tone/Strength: Functionally intact. No obvious neuro-muscular anomalies detected.   Muscle Tone/Strength: Functionally intact. No obvious neuro-muscular anomalies detected.  Sensory (Neurological): Musculoskeletal pain pattern         Sensory (Neurological): Unimpaired        DTR: Patellar: deferred today Achilles: deferred today Plantar: deferred today   DTR: Patellar: deferred today Achilles: deferred today Plantar: deferred today  Palpation: No palpable anomalies   Palpation: No palpable anomalies   Peripheral nerve stimulator lead removed.  Tip intact.  Insertion site clean, dry, not erythematous  Assessment   Diagnosis Status  1. Chronic pain syndrome   2. SI joint arthritis (HCC)   3. Piriformis syndrome of right side    Controlled Having a Flare-up Having a  Flare-up   Updated Problems: No problems updated.  Plan of Care    Beth Fox has a current medication list which includes the following long-term medication(s): gabapentin, hydrochlorothiazide, omeprazole, sertraline, and trazodone.  Pharmacotherapy (Medications Ordered): No orders of the defined types were placed in this encounter.  Orders:  Orders Placed This Encounter  Procedures   SACROILIAC JOINT INJECTION    Standing Status:   Future    Expected Date:   01/04/2024    Expiration Date:   03/05/2024    Scheduling Instructions:     Side: RIGHT     Sedation: pts choice     Timeframe: ASAP    Where will this procedure be performed?:   [facility]   TRIGGER POINT INJECTION    Area: Buttocks region (gluteal area) Indications: Piriformis muscle pain;  right piriformis-syndrome; piriformis muscle spasms (M62.838). CPT code: 16109    Standing Status:   Future    Expected Date:   01/04/2024    Expiration Date:   12/06/2024    Scheduling Instructions:     Type: Myoneural block (TPI) of piriformis muscle.     Side:  RIGHT     Sedation: Patient's choice.     Timeframe: Today    Where will this procedure be performed?:   [facility]   Follow-up plan:   Return in about 4 weeks (around 01/04/2024) for Right SI-J and Right Piriformis TPI, PO Valium.      Right SI joint, right piriformis, right L4 medial branch peripheral nerve stimulator 10/04/2023     Recent Visits Date Type Provider Dept  11/03/23 Office Visit Edward Jolly, MD [facility]  10/04/23 Procedure visit Edward Jolly, MD [facility]  09/08/23 Office Visit Edward Jolly, MD [facility]  Showing recent visits within past 90 days and meeting all other requirements Today's Visits Date Type Provider Dept  12/07/23 Procedure visit Edward Jolly, MD [facility]  Showing today's visits and meeting all other requirements Future Appointments Date Type  Provider Dept  01/10/24 Appointment Edward Jolly, MD [facility]  Showing future appointments within next 90 days and meeting all other requirements  I discussed the assessment and treatment plan with the patient. The patient was provided an opportunity to ask questions and all were answered. The patient agreed with the plan and demonstrated an understanding of the instructions.  Patient advised  to call back or seek an in-person evaluation if the symptoms or condition worsens.  Duration of encounter:6minutes.  Total time on encounter, as per AMA guidelines included both the face-to-face and non-face-to-face time personally spent by the physician and/or other qualified health care professional(s) on the day of the encounter (includes time in activities that require the physician or other qualified health care professional and does not include time in activities normally performed by clinical staff). Physician's time may include the following activities when performed: Preparing to see the patient (e.g., pre-charting review of records, searching for previously ordered imaging, lab work, and nerve conduction tests) Review of prior analgesic pharmacotherapies. Reviewing PMP Interpreting ordered tests (e.g., lab work, imaging, nerve conduction tests) Performing post-procedure evaluations, including interpretation of diagnostic procedures Obtaining and/or reviewing separately obtained history Performing a medically appropriate examination and/or evaluation Counseling and educating the patient/family/caregiver Ordering medications, tests, or procedures Referring and communicating with other health care professionals (when not separately reported) Documenting clinical information in the electronic or other health record Independently interpreting results (not separately reported) and communicating results to the patient/ family/caregiver Care coordination (not separately reported)  Note  by: Edward Jolly, MD Date: 12/07/2023; Time: 1:51 PM

## 2023-12-07 NOTE — Patient Instructions (Signed)

## 2023-12-07 NOTE — Progress Notes (Signed)
 Safety precautions to be maintained throughout the outpatient stay will include: orient to surroundings, keep bed in low position, maintain call bell within reach at all times, provide assistance with transfer out of bed and ambulation.

## 2023-12-08 ENCOUNTER — Encounter: Payer: Self-pay | Admitting: Family Medicine

## 2023-12-08 ENCOUNTER — Other Ambulatory Visit (INDEPENDENT_AMBULATORY_CARE_PROVIDER_SITE_OTHER): Payer: Medicare Other

## 2023-12-08 DIAGNOSIS — E7800 Pure hypercholesterolemia, unspecified: Secondary | ICD-10-CM | POA: Diagnosis not present

## 2023-12-08 DIAGNOSIS — R7309 Other abnormal glucose: Secondary | ICD-10-CM

## 2023-12-08 DIAGNOSIS — E559 Vitamin D deficiency, unspecified: Secondary | ICD-10-CM

## 2023-12-08 LAB — COMPREHENSIVE METABOLIC PANEL
ALT: 17 U/L (ref 0–35)
AST: 20 U/L (ref 0–37)
Albumin: 4.4 g/dL (ref 3.5–5.2)
Alkaline Phosphatase: 87 U/L (ref 39–117)
BUN: 16 mg/dL (ref 6–23)
CO2: 33 meq/L — ABNORMAL HIGH (ref 19–32)
Calcium: 9.7 mg/dL (ref 8.4–10.5)
Chloride: 100 meq/L (ref 96–112)
Creatinine, Ser: 1.05 mg/dL (ref 0.40–1.20)
GFR: 53.38 mL/min — ABNORMAL LOW (ref >60.00)
Glucose, Bld: 106 mg/dL — ABNORMAL HIGH (ref 70–99)
Potassium: 4.3 meq/L (ref 3.5–5.1)
Sodium: 141 meq/L (ref 135–145)
Total Bilirubin: 0.5 mg/dL (ref 0.2–1.2)
Total Protein: 7.1 g/dL (ref 6.0–8.3)

## 2023-12-08 LAB — LIPID PANEL
Cholesterol: 243 mg/dL — ABNORMAL HIGH (ref 0–200)
HDL: 49.5 mg/dL (ref >39.00)
LDL Cholesterol: 164 mg/dL — ABNORMAL HIGH (ref 0–99)
NonHDL: 193.64
Total CHOL/HDL Ratio: 5
Triglycerides: 149 mg/dL (ref 0.0–149.0)
VLDL: 29.8 mg/dL (ref 0.0–40.0)

## 2023-12-08 LAB — VITAMIN D 25 HYDROXY (VIT D DEFICIENCY, FRACTURES): VITD: 50.65 ng/mL (ref 30.00–100.00)

## 2023-12-08 LAB — HEMOGLOBIN A1C: Hgb A1c MFr Bld: 5.8 % (ref 4.6–6.5)

## 2023-12-08 NOTE — Progress Notes (Signed)
 No critical labs need to be addressed urgently. We will discuss labs in detail at upcoming office visit.

## 2023-12-15 ENCOUNTER — Other Ambulatory Visit: Payer: Self-pay

## 2023-12-15 ENCOUNTER — Ambulatory Visit: Payer: Medicare Other | Admitting: Family Medicine

## 2023-12-15 ENCOUNTER — Encounter: Payer: Self-pay | Admitting: Family Medicine

## 2023-12-15 VITALS — BP 126/72 | HR 68 | Temp 98.9°F | Ht 64.88 in | Wt 160.2 lb

## 2023-12-15 DIAGNOSIS — Z1211 Encounter for screening for malignant neoplasm of colon: Secondary | ICD-10-CM

## 2023-12-15 DIAGNOSIS — R7309 Other abnormal glucose: Secondary | ICD-10-CM

## 2023-12-15 DIAGNOSIS — F331 Major depressive disorder, recurrent, moderate: Secondary | ICD-10-CM

## 2023-12-15 DIAGNOSIS — I10 Essential (primary) hypertension: Secondary | ICD-10-CM | POA: Diagnosis not present

## 2023-12-15 DIAGNOSIS — G4700 Insomnia, unspecified: Secondary | ICD-10-CM

## 2023-12-15 DIAGNOSIS — Z0000 Encounter for general adult medical examination without abnormal findings: Secondary | ICD-10-CM | POA: Diagnosis not present

## 2023-12-15 DIAGNOSIS — D72829 Elevated white blood cell count, unspecified: Secondary | ICD-10-CM | POA: Diagnosis not present

## 2023-12-15 DIAGNOSIS — E7800 Pure hypercholesterolemia, unspecified: Secondary | ICD-10-CM

## 2023-12-15 DIAGNOSIS — G4733 Obstructive sleep apnea (adult) (pediatric): Secondary | ICD-10-CM

## 2023-12-15 DIAGNOSIS — K760 Fatty (change of) liver, not elsewhere classified: Secondary | ICD-10-CM

## 2023-12-15 LAB — CBC WITH DIFFERENTIAL/PLATELET
Basophils Absolute: 0 10*3/uL (ref 0.0–0.1)
Basophils Relative: 0.5 % (ref 0.0–3.0)
Eosinophils Absolute: 0.1 10*3/uL (ref 0.0–0.7)
Eosinophils Relative: 1.7 % (ref 0.0–5.0)
HCT: 39.8 % (ref 36.0–46.0)
Hemoglobin: 13.2 g/dL (ref 12.0–15.0)
Lymphocytes Relative: 30.7 % (ref 12.0–46.0)
Lymphs Abs: 1.7 10*3/uL (ref 0.7–4.0)
MCHC: 33.2 g/dL (ref 30.0–36.0)
MCV: 92.8 fl (ref 78.0–100.0)
Monocytes Absolute: 0.3 10*3/uL (ref 0.1–1.0)
Monocytes Relative: 6.2 % (ref 3.0–12.0)
Neutro Abs: 3.4 10*3/uL (ref 1.4–7.7)
Neutrophils Relative %: 60.9 % (ref 43.0–77.0)
Platelets: 254 10*3/uL (ref 150.0–400.0)
RBC: 4.29 Mil/uL (ref 3.87–5.11)
RDW: 13.2 % (ref 11.5–15.5)
WBC: 5.6 10*3/uL (ref 4.0–10.5)

## 2023-12-15 MED ORDER — TRAZODONE HCL 100 MG PO TABS: 150.0000 mg | ORAL_TABLET | Freq: Every day | ORAL | Status: DC

## 2023-12-15 MED ORDER — ATORVASTATIN CALCIUM 10 MG PO TABS: 10.0000 mg | ORAL_TABLET | Freq: Every day | ORAL | 3 refills | Status: AC

## 2023-12-15 MED ORDER — VITAMIN D3 1.25 MG (50000 UT) PO CAPS: ORAL_CAPSULE | ORAL | 0 refills | Status: AC

## 2023-12-15 NOTE — Assessment & Plan Note (Signed)
 Chronic, inadequate control... increase trazodone to 150 mg daily... if ineffective can consider trial of amitriptyline given back pain issues as well.

## 2023-12-15 NOTE — Progress Notes (Signed)
 Patient ID: Beth Fox, female    DOB: 06/14/1951, 73 y.o.   MRN: [id number]  This visit was conducted in person.  BP 126/72   Pulse 68   Temp 98.9 F (37.2 C) (Oral)   Ht 5' 4.88" (1.648 m)   Wt 160 lb 3.2 oz (72.7 kg)   SpO2 91%   BMI 26.76 kg/m    CC:  Chief Complaint  Patient presents with   Annual Exam   HPI: Beth Fox is a 73 y.o. female presenting on 12/15/2023 for Annual Exam  The patient presents for  complete physical and review of chronic health problems. He/She also has the following acute concerns today: none  I have personally reviewed the Medicare Annual Wellness questionnaire and have noted 1. The patient's medical and social history 2. Their use of alcohol, tobacco or illicit drugs 3. Their current medications and supplements 4. The patient's functional ability including ADL's, fall risks, home safety risks and hearing or visual             impairment. 5. Diet and physical activities 6. Evidence for depression or mood disorders 7.         Updated provider list Cognitive evaluation was performed and recorded on pt medicare questionnaire form. The patients weight, height, BMI and visual acuity have been recorded in the chart   I have made referrals, counseling and provided education to the patient based review of the above and I have provided the pt with a written personalized care plan for preventive services.   Documentation of this information was scanned into the electronic record under the media tab.    Advance directives and end of life planning reviewed in detail with patient and documented in EMR. Patient given handout on advance care directives if needed. HCPOA and living will updated if needed.  Screening for hearing and vision performed and documented in Epic.  No falls in last 12 months.   MDD, stable control on sertraline 50 mg daily, trazodone 100 mg p.o. nightly... she feels she still has some issues falling sleep.  Using  gabapentin for back issues. Flowsheet Row Office Visit from 12/15/2023 in [facility]  PHQ-2 Total Score 0      Elevated Cholesterol: Inadequate control of cholesterol  But has not  been taking  red yeast rice 600 mg daily.. statin recommended Lab Results  Component Value Date   CHOL 243 (H) 12/08/2023   HDL 49.50 12/08/2023   LDLCALC 164 (H) 12/08/2023   LDLDIRECT 167.7 09/01/2013   TRIG 149.0 12/08/2023   CHOLHDL 5 12/08/2023   The 10-year ASCVD risk score (Arnett DK, et al., 2019) is: 14.5%   Values used to calculate the score:     Age: 65 years     Sex: Female     Is Non-Hispanic African American: No     Diabetic: No     Tobacco smoker: No     Systolic Blood Pressure: 126 mmHg     Is BP treated: Yes     HDL Cholesterol: 49.5 mg/dL     Total Cholesterol: 243 mg/dL Using medications without problems: none Muscle aches:  none Diet compliance: moderate Exercise:  none Other complaints:   Hypertension:   Stable control on HCTZ  BP Readings from Last 3 Encounters:  12/15/23 126/72  12/07/23 125/70  11/03/23 (!) 148/83  Using medication without problems or lightheadedness: none Chest pain with exertion: none Edema:none Short of  breath:none Average home BPs: Other issues:    Prediabetes. Lab Results  Component Value Date   HGBA1C 5.8 12/08/2023   Sleep apnea: refused to use CPAP  Elevated wbc.. likely due to steroid infection.Marland Kitchen re-eval.  Relevant past medical, surgical, family and social history reviewed and updated as indicated. Interim medical history since our last visit reviewed. Allergies and medications reviewed and updated. Outpatient Medications Prior to Visit  Medication Sig Dispense Refill   acetaminophen (TYLENOL) 500 MG tablet Take 500 mg by mouth every 8 (eight) hours as needed.     gabapentin (NEURONTIN) 100 MG capsule Take 1 capsule by mouth every morning and 3 capsules by mouth at bedtime     hydrochlorothiazide  (HYDRODIURIL) 25 MG tablet TAKE 1 TABLET BY MOUTH EVERY DAY 90 tablet 1   mometasone (ELOCON) 0.1 % cream Apply 1-2 times daily as needed for rash. Avoid applying to face, groin, and axilla. Use as directed. Long-term use can cause thinning of the skin. 45 g 1   omeprazole (PRILOSEC) 20 MG capsule TAKE 1 CAPSULE (20 MG TOTAL) BY MOUTH 2 (TWO) TIMES DAILY BEFORE A MEAL. 180 capsule 0   sertraline (ZOLOFT) 50 MG tablet TAKE 1 TABLET BY MOUTH EVERY DAY 90 tablet 1   traMADol (ULTRAM) 50 MG tablet Take 25-50 mg by mouth 2 (two) times daily as needed.     Cholecalciferol (VITAMIN D3) 1.25 MG (50000 UT) CAPS TAKE 1 CAPSULE BY MOUTH ONE TIME PER WEEK 12 capsule 0   Red Yeast Rice Extract (RED YEAST RICE PO) Take by mouth.     traZODone (DESYREL) 100 MG tablet TAKE 1 TABLET BY MOUTH EVERYDAY AT BEDTIME 90 tablet 0   No facility-administered medications prior to visit.     Per HPI unless specifically indicated in ROS section below Review of Systems  Constitutional:  Negative for fatigue and fever.  HENT:  Negative for congestion.   Eyes:  Negative for pain.  Respiratory:  Negative for cough and shortness of breath.   Cardiovascular:  Negative for chest pain, palpitations and leg swelling.  Gastrointestinal:  Negative for abdominal pain.  Genitourinary:  Negative for dysuria and vaginal bleeding.  Musculoskeletal:  Negative for back pain.  Neurological:  Negative for syncope, light-headedness and headaches.  Psychiatric/Behavioral:  Negative for dysphoric mood.    Objective:  BP 126/72   Pulse 68   Temp 98.9 F (37.2 C) (Oral)   Ht 5' 4.88" (1.648 m)   Wt 160 lb 3.2 oz (72.7 kg)   SpO2 91%   BMI 26.76 kg/m   Wt Readings from Last 3 Encounters:  12/15/23 160 lb 3.2 oz (72.7 kg)  12/07/23 150 lb (68 kg)  11/03/23 150 lb (68 kg)      Physical Exam Vitals and nursing note reviewed.  Constitutional:      General: She is not in acute distress.    Appearance: Normal appearance. She is  well-developed. She is not ill-appearing or toxic-appearing.  HENT:     Head: Normocephalic.     Right Ear: Hearing, tympanic membrane, ear canal and external ear normal.     Left Ear: Hearing, tympanic membrane, ear canal and external ear normal.     Nose: Nose normal.  Eyes:     General: Lids are normal. Lids are everted, no foreign bodies appreciated.     Conjunctiva/sclera: Conjunctivae normal.     Pupils: Pupils are equal, round, and reactive to light.  Neck:     Thyroid: No  thyroid mass or thyromegaly.     Vascular: No carotid bruit.     Trachea: Trachea normal.  Cardiovascular:     Rate and Rhythm: Normal rate and regular rhythm.     Heart sounds: Normal heart sounds, S1 normal and S2 normal. No murmur heard.    No gallop.  Pulmonary:     Effort: Pulmonary effort is normal. No respiratory distress.     Breath sounds: Normal breath sounds. No wheezing, rhonchi or rales.  Abdominal:     General: Bowel sounds are normal. There is no distension or abdominal bruit.     Palpations: Abdomen is soft. There is no fluid wave or mass.     Tenderness: There is no abdominal tenderness. There is no guarding or rebound.     Hernia: No hernia is present.  Musculoskeletal:     Cervical back: Normal range of motion and neck supple.  Lymphadenopathy:     Cervical: No cervical adenopathy.  Skin:    General: Skin is warm and dry.     Findings: No rash.  Neurological:     Mental Status: She is alert.     Cranial Nerves: No cranial nerve deficit.     Sensory: No sensory deficit.  Psychiatric:        Mood and Affect: Mood is not anxious or depressed.        Speech: Speech normal.        Behavior: Behavior normal. Behavior is cooperative.        Judgment: Judgment normal.       Results for orders placed or performed in visit on 12/08/23  VITAMIN D 25 Hydroxy (Vit-D Deficiency, Fractures)   Collection Time: 12/08/23  8:29 AM  Result Value Ref Range   VITD 50.65 30.00 - 100.00 ng/mL   Lipid panel   Collection Time: 12/08/23  8:29 AM  Result Value Ref Range   Cholesterol 243 (H) 0 - 200 mg/dL   Triglycerides 540.9 0.0 - 149.0 mg/dL   HDL 81.19 >14.78 mg/dL   VLDL 29.5 0.0 - 62.1 mg/dL   LDL Cholesterol 308 (H) 0 - 99 mg/dL   Total CHOL/HDL Ratio 5    NonHDL 193.64   Hemoglobin A1c   Collection Time: 12/08/23  8:29 AM  Result Value Ref Range   Hgb A1c MFr Bld 5.8 4.6 - 6.5 %  Comprehensive metabolic panel   Collection Time: 12/08/23  8:29 AM  Result Value Ref Range   Sodium 141 135 - 145 mEq/L   Potassium 4.3 3.5 - 5.1 mEq/L   Chloride 100 96 - 112 mEq/L   CO2 33 (H) 19 - 32 mEq/L   Glucose, Bld 106 (H) 70 - 99 mg/dL   BUN 16 6 - 23 mg/dL   Creatinine, Ser 6.57 0.40 - 1.20 mg/dL   Total Bilirubin 0.5 0.2 - 1.2 mg/dL   Alkaline Phosphatase 87 39 - 117 U/L   AST 20 0 - 37 U/L   ALT 17 0 - 35 U/L   Total Protein 7.1 6.0 - 8.3 g/dL   Albumin 4.4 3.5 - 5.2 g/dL   GFR 84.69 (L) >62.95 mL/min   Calcium 9.7 8.4 - 10.5 mg/dL    This visit occurred during the SARS-CoV-2 public health emergency.  Safety protocols were in place, including screening questions prior to the visit, additional usage of staff PPE, and extensive cleaning of exam room while observing appropriate contact time as indicated for disinfecting solutions.   COVID 19 screen:  No recent travel or known exposure to COVID19 The patient denies respiratory symptoms of COVID 19 at this time. The importance of social distancing was discussed today.   Assessment and Plan   The patient's preventative maintenance and recommended screening tests for an annual wellness exam were reviewed in full today. Brought up to date unless services declined.  Counselled on the importance of diet, exercise, and its role in overall health and mortality. The patient's FH and SH was reviewed, including their home life, tobacco status, and drug and alcohol status.   Vaccines:  Uptodate with  Shingrix, tdap, flu, pna 23,  13 Colon:Not sure when last colonoscopy done, likely over 10 years but was nml. She has opted for cologuard .Marland Kitchen Negative in 2022, due for repeat. DEXA: No family history of osteoporosis,  DEXA 2016, osteopenia, 2023 osteopenia -2.1 increased in hip, decreased in spine., repeat in 5 years. Mammo:stable 09/2018 , personal history of breast cancer..  03/2022.. , due for repeat. Pap/DVE: nml pap, neg HPV in 12/2012, no history of abnormal paps no further indicated as Asymptomatic and low risk for ovarian or uterine cancer. Nonsmoker.  HEP C:   done   Problem List Items Addressed This Visit     Benign essential hypertension (Chronic)   Well controled in office today on HCTZ 25 mg daily      Relevant Medications   atorvastatin (LIPITOR) 10 MG tablet   Fatty liver (Chronic)   Chronic, stable  normal LFTs      HYPERCHOLESTEROLEMIA (Chronic)   Chronic, increased risk of cardiovascular disease: 10-year ASCVD risk score 14.5.  Not currently taking red yeast rice.    Will start low dose statin... atorvastatin 10 mg daily.  Reviewed lifestyle changes.      Relevant Medications   atorvastatin (LIPITOR) 10 MG tablet   INSOMNIA, CHRONIC (Chronic)   Chronic, inadequate control... increase trazodone to 150 mg daily... if ineffective can consider trial of amitriptyline given back pain issues as well.      Leukocytosis   Acute, most likely secondary to recent steroid injections.    Send for re-eval.      Relevant Orders   CBC with Differential/Platelet   MDD (major depressive disorder), recurrent episode, moderate (HCC) (Chronic)   Stable, chronic.  Continue current medication.  Sertraline 50 mg daily.  Increase Trazodone  to 150 mg p.o. nightly      Relevant Medications   traZODone (DESYREL) 100 MG tablet   PREDIABETES (Chronic)   Stable control, work on low carb diet.      Sleep apnea    Untreated.. not interested in CPAP despite risks.      Other Visit Diagnoses        Medicare annual wellness visit, subsequent    -  Primary     Colon cancer screening       Relevant Orders   Cologuard       Orders Placed This Encounter  Procedures   CBC with Differential/Platelet   Cologuard   Meds ordered this encounter  Medications   traZODone (DESYREL) 100 MG tablet    Sig: Take 1.5 tablets (150 mg total) by mouth at bedtime.   atorvastatin (LIPITOR) 10 MG tablet    Sig: Take 1 tablet (10 mg total) by mouth daily.    Dispense:  90 tablet    Refill:  3    Kerby Nora, MD

## 2023-12-15 NOTE — Assessment & Plan Note (Signed)
Well controled in office today on HCTZ 25 mg daily 

## 2023-12-15 NOTE — Assessment & Plan Note (Signed)
 Untreated.. not interested in CPAP despite risks.

## 2023-12-15 NOTE — Patient Instructions (Addendum)
 Try a trial of trazodone increase for insomnia to 150 mg  at bedtime.. call if side effects or ineffective.  Start trail of atorvastatin 10 mg 1/2 tablet three days a week then if no side effects titrate up to  1 tablet daily.  If muscle ache.. can use OTC COQ10 for side effect.   Work on regular water exercise and low cholesterol diet.   Please stop at the lab to have labs drawn.   Please call the location of your choice from the menu below to schedule your Mammogram and/or Bone Density appointment.    [facility]   [facility]                      Phone:  [phone] [address]                               [place], [place] [address]                                                             Services: Traditional and 3D Mammogram, Bone Density    [facility]                 Phone: [phone] [address]                                                       [place], [place] [address]    Service: Bone Density ONLY   *this site does NOT perform mammograms  [facility]                        Phone:  [phone] [address]                                  [place], [place] [address]                                            Services:  3D Mammogram and Bone Density    [facility]   Phone:  [phone]   [address]                                                                            [place], [place] [address]  Services: 3D Mammogram and Bone Density  [facility])  Phone:  [phone]   [address]                        [place], [place] [address]                                              Services:  3D Mammogram and Bone Density

## 2023-12-15 NOTE — Assessment & Plan Note (Addendum)
 Chronic, increased risk of cardiovascular disease: 10-year ASCVD risk score 14.5.  Not currently taking red yeast rice.    Will start low dose statin... atorvastatin 10 mg daily.  Reviewed lifestyle changes.

## 2023-12-15 NOTE — Assessment & Plan Note (Signed)
 Chronic, stable  normal LFTs

## 2023-12-15 NOTE — Assessment & Plan Note (Addendum)
 Stable, chronic.  Continue current medication.  Sertraline 50 mg daily.  Increase Trazodone  to 150 mg p.o. nightly

## 2023-12-15 NOTE — Assessment & Plan Note (Signed)
 Acute, most likely secondary to recent steroid injections.    Send for re-eval.

## 2023-12-15 NOTE — Assessment & Plan Note (Signed)
Stable control, work on low carb diet. 

## 2023-12-25 ENCOUNTER — Other Ambulatory Visit: Payer: Self-pay | Admitting: Family Medicine

## 2023-12-30 LAB — COLOGUARD: COLOGUARD: NEGATIVE

## 2024-01-10 ENCOUNTER — Ambulatory Visit
Payer: Medicare Other | Attending: Student in an Organized Health Care Education/Training Program | Admitting: Student in an Organized Health Care Education/Training Program

## 2024-01-10 ENCOUNTER — Encounter: Payer: Self-pay | Admitting: Student in an Organized Health Care Education/Training Program

## 2024-01-10 ENCOUNTER — Ambulatory Visit
Admission: RE | Admit: 2024-01-10 | Discharge: 2024-01-10 | Disposition: A | Discharge status: Home / Self Care | Source: Ambulatory Visit | Attending: Student in an Organized Health Care Education/Training Program | Admitting: Student in an Organized Health Care Education/Training Program

## 2024-01-10 VITALS — BP 129/85 | HR 91 | Temp 97.7°F | Resp 17 | Ht 63.0 in | Wt 160.0 lb

## 2024-01-10 DIAGNOSIS — Z79899 Other long term (current) drug therapy: Secondary | ICD-10-CM | POA: Diagnosis not present

## 2024-01-10 DIAGNOSIS — G5701 Lesion of sciatic nerve, right lower limb: Secondary | ICD-10-CM | POA: Diagnosis not present

## 2024-01-10 DIAGNOSIS — M461 Sacroiliitis, not elsewhere classified: Secondary | ICD-10-CM | POA: Diagnosis not present

## 2024-01-10 DIAGNOSIS — M47816 Spondylosis without myelopathy or radiculopathy, lumbar region: Secondary | ICD-10-CM | POA: Diagnosis not present

## 2024-01-10 DIAGNOSIS — G894 Chronic pain syndrome: Secondary | ICD-10-CM

## 2024-01-10 DIAGNOSIS — M533 Sacrococcygeal disorders, not elsewhere classified: Secondary | ICD-10-CM | POA: Insufficient documentation

## 2024-01-10 DIAGNOSIS — M25559 Pain in unspecified hip: Secondary | ICD-10-CM | POA: Insufficient documentation

## 2024-01-10 MED ORDER — DIAZEPAM 5 MG PO TABS
5.0000 mg | ORAL_TABLET | ORAL | Status: AC
  Administered 2024-01-10: 5 mg via ORAL

## 2024-01-10 MED ORDER — ROPIVACAINE HCL 2 MG/ML IJ SOLN
INTRAMUSCULAR | Status: AC
  Filled 2024-01-10: qty 20

## 2024-01-10 MED ORDER — LIDOCAINE HCL 2 % IJ SOLN
20.0000 mL | Freq: Once | INTRAMUSCULAR | Status: AC
Start: 2024-01-10 — End: 2024-01-10
  Administered 2024-01-10: 400 mg

## 2024-01-10 MED ORDER — DEXAMETHASONE SODIUM PHOSPHATE 10 MG/ML IJ SOLN
10.0000 mg | Freq: Once | INTRAMUSCULAR | Status: AC
  Administered 2024-01-10: 10 mg

## 2024-01-10 MED ORDER — METHYLPREDNISOLONE ACETATE 40 MG/ML IJ SUSP
INTRAMUSCULAR | Status: AC
  Filled 2024-01-10: qty 1

## 2024-01-10 MED ORDER — IOHEXOL 180 MG/ML  SOLN
INTRAMUSCULAR | Status: AC
  Filled 2024-01-10: qty 20

## 2024-01-10 MED ORDER — METHYLPREDNISOLONE ACETATE 40 MG/ML IJ SUSP
40.0000 mg | Freq: Once | INTRAMUSCULAR | Status: AC
  Administered 2024-01-10: 40 mg via INTRA_ARTICULAR

## 2024-01-10 MED ORDER — DIAZEPAM 5 MG PO TABS
ORAL_TABLET | ORAL | Status: AC
  Filled 2024-01-10: qty 1

## 2024-01-10 MED ORDER — ROPIVACAINE HCL 2 MG/ML IJ SOLN
9.0000 mL | Freq: Once | INTRAMUSCULAR | Status: AC
  Administered 2024-01-10: 9 mL via PERINEURAL

## 2024-01-10 MED ORDER — LIDOCAINE HCL 2 % IJ SOLN
INTRAMUSCULAR | Status: AC
  Filled 2024-01-10: qty 20

## 2024-01-10 MED ORDER — IOHEXOL 180 MG/ML  SOLN
10.0000 mL | Freq: Once | INTRAMUSCULAR | Status: AC
  Administered 2024-01-10: 10 mL via INTRA_ARTICULAR

## 2024-01-10 MED ORDER — DEXAMETHASONE SODIUM PHOSPHATE 10 MG/ML IJ SOLN
INTRAMUSCULAR | Status: AC
  Filled 2024-01-10: qty 1

## 2024-01-10 NOTE — Patient Instructions (Signed)
____________________________________________________________________________________________  Post-Procedure Discharge Instructions  Instructions: Apply ice:  Purpose: This will minimize any swelling and discomfort after procedure.  When: Day of procedure, as soon as you get home. How: Fill a plastic sandwich bag with crushed ice. Cover it with a small towel and apply to injection site. How long: (15 min on, 15 min off) Apply for 15 minutes then remove x 15 minutes.  Repeat sequence on day of procedure, until you go to bed. Apply heat:  Purpose: To treat any soreness and discomfort from the procedure. When: Starting the next day after the procedure. How: Apply heat to procedure site starting the day following the procedure. How long: May continue to repeat daily, until discomfort goes away. Food intake: Start with clear liquids (like water) and advance to regular food, as tolerated.  Physical activities: Keep activities to a minimum for the first 8 hours after the procedure. After that, then as tolerated. Driving: If you have received any sedation, be responsible and do not drive. You are not allowed to drive for 24 hours after having sedation. Blood thinner: (Applies only to those taking blood thinners) You may restart your blood thinner 6 hours after your procedure. Insulin: (Applies only to Diabetic patients taking insulin) As soon as you can eat, you may resume your normal dosing schedule. Infection prevention: Keep procedure site clean and dry. Shower daily and clean area with soap and water. Post-procedure Pain Diary: Extremely important that this be done correctly and accurately. Recorded information will be used to determine the next step in treatment. For the purpose of accuracy, follow these rules: Evaluate only the area treated. Do not report or include pain from an untreated area. For the purpose of this evaluation, ignore all other areas of pain, except for the treated area. After  your procedure, avoid taking a long nap and attempting to complete the pain diary after you wake up. Instead, set your alarm clock to go off every hour, on the hour, for the initial 8 hours after the procedure. Document the duration of the numbing medicine, and the relief you are getting from it. Do not go to sleep and attempt to complete it later. It will not be accurate. If you received sedation, it is likely that you were given a medication that may cause amnesia. Because of this, completing the diary at a later time may cause the information to be inaccurate. This information is needed to plan your care. Follow-up appointment: Keep your post-procedure follow-up evaluation appointment after the procedure (usually 2 weeks for most procedures, 6 weeks for radiofrequencies). DO NOT FORGET to bring you pain diary with you.   Expect: (What should I expect to see with my procedure?) From numbing medicine (AKA: Local Anesthetics): Numbness or decrease in pain. You may also experience some weakness, which if present, could last for the duration of the local anesthetic. Onset: Full effect within 15 minutes of injected. Duration: It will depend on the type of local anesthetic used. On the average, 1 to 8 hours.  From steroids (Applies only if steroids were used): Decrease in swelling or inflammation. Once inflammation is improved, relief of the pain will follow. Onset of benefits: Depends on the amount of swelling present. The more swelling, the longer it will take for the benefits to be seen. In some cases, up to 10 days. Duration: Steroids will stay in the system x 2 weeks. Duration of benefits will depend on multiple posibilities including persistent irritating factors. Side-effects: If present, they   may typically last 2 weeks (the duration of the steroids). Frequent: Cramps (if they occur, drink Gatorade and take over-the-counter Magnesium 450-500 mg once to twice a day); water retention with temporary  weight gain; increases in blood sugar; decreased immune system response; increased appetite. Occasional: Facial flushing (red, warm cheeks); mood swings; menstrual changes. Uncommon: Long-term decrease or suppression of natural hormones; bone thinning. (These are more common with higher doses or more frequent use. This is why we prefer that our patients avoid having any injection therapies in other practices.)  Very Rare: Severe mood changes; psychosis; aseptic necrosis. From procedure: Some discomfort is to be expected once the numbing medicine wears off. This should be minimal if ice and heat are applied as instructed.  Call if: (When should I call?) You experience numbness and weakness that gets worse with time, as opposed to wearing off. New onset bowel or bladder incontinence. (Applies only to procedures done in the spine)  Emergency Numbers: Durning business hours (Monday - Thursday, 8:00 AM - 4:00 PM) (Friday, 9:00 AM - 12:00 Noon): [phone] After hours: [phone] NOTE: If you are having a problem and are unable connect with, or to talk to a provider, then go to your nearest urgent care or emergency department. If the problem is serious and urgent, please call 911. ____________________________________________________________________________________________  Sacroiliac (SI) Joint Injection Patient Information  Description: The sacroiliac joint connects the scrum (very low back and tailbone) to the ilium (a pelvic bone which also forms half of the hip joint).  Normally this joint experiences very little motion.  When this joint becomes inflamed or unstable low back and or hip and pelvis pain may result.  Injection of this joint with local anesthetics (numbing medicines) and steroids can provide diagnostic information and reduce pain.  This injection is performed with the aid of x-ray guidance into the tailbone area while you are lying on your stomach.   You may experience an  electrical sensation down the leg while this is being done.  You may also experience numbness.  We also may ask if we are reproducing your normal pain during the injection.  Conditions which may be treated SI injection:  Low back, buttock, hip or leg pain  Preparation for the Injection:  Do not eat any solid food or dairy products within 8 hours of your appointment.  You may drink clear liquids up to 3 hours before appointment.  Clear liquids include water, black coffee, juice or soda.  No milk or cream please. You may take your regular medications, including pain medications with a sip of water before your appointment.  Diabetics should hold regular insulin (if take separately) and take 1/2 normal NPH dose the morning of the procedure.  Carry some sugar containing items with you to your appointment. A driver must accompany you and be prepared to drive you home after your procedure. Bring all of your current medications with you. An IV may be inserted and sedation may be given at the discretion of the physician. A blood pressure cuff, EKG and other monitors will often be applied during the procedure.  Some patients may need to have extra oxygen administered for a short period.  You will be asked to provide medical information, including your allergies, prior to the procedure.  We must know immediately if you are taking blood thinners (like Coumadin/Warfarin) or if you are allergic to IV iodine contrast (dye).  We must know if you could possible be pregnant.  Possible side   effects:  Bleeding from needle site Infection (rare, may require surgery) Nerve injury (rare) Numbness & tingling (temporary) A brief convulsion or seizure Light-headedness (temporary) Pain at injection site (several days) Decreased blood pressure (temporary) Weakness in the leg (temporary)   Call if you experience:  New onset weakness or numbness of an extremity below the injection site that last more than 8  hours. Hives or difficulty breathing ( go to the emergency room) Inflammation or drainage at the injection site Any new symptoms which are concerning to you  Please note:  Although the local anesthetic injected can often make your back/ hip/ buttock/ leg feel good for several hours after the injections, the pain will likely return.  It takes 3-7 days for steroids to work in the sacroiliac area.  You may not notice any pain relief for at least that one week.  If effective, we will often do a series of three injections spaced 3-6 weeks apart to maximally decrease your pain.  After the initial series, we generally will wait some months before a repeat injection of the same type.  If you have any questions, please call [phone] [facility]   

## 2024-01-10 NOTE — Progress Notes (Signed)
 PROVIDER NOTE: Interpretation of information contained herein should be left to medically-trained personnel. Specific patient instructions are provided elsewhere under "Patient Instructions" section of medical record. This document was created in part using STT-dictation technology, any transcriptional errors that may result from this process are unintentional.  Patient: Beth Fox Type: Established DOB: [birth year] MRN: [id number] PCP: Excell Seltzer, MD  Service: Procedure DOS: 01/10/2024 Setting: Ambulatory Location: Ambulatory outpatient facility Delivery: Face-to-face Provider: Edward Jolly, MD Specialty: Interventional Pain Management Specialty designation: 09 Location: Outpatient facility Ref. Prov.: Excell Seltzer, MD       Interventional Therapy   Procedure: Sacroiliac Joint Steroid Injection #2 and Right Piriformis TPI #2  Laterality: Right     Level: PIIS (Posterior Inferior Iliac Spine)  Imaging: Fluoroscopic guidance Anesthesia: Local anesthesia (1-2% Lidocaine) Sedation: P.o. Valium 5 mg DOS: 01/10/2024  Performed by: Edward Jolly, MD  Purpose: Diagnostic/Therapeutic Indications: Sacroiliac joint pain in the lower back and hip area severe enough to impact quality of life or function. Rationale (medical necessity): procedure needed and proper for the diagnosis and/or treatment of Beth Fox's medical symptoms and needs. 1. SI joint arthritis (HCC)   2. Piriformis syndrome of right side   3. Chronic pain syndrome    NAS-11 Pain score:   Pre-procedure: 8 /10   Post-procedure: 8 /10     Target: Interarticular sacroiliac joint. Location: Medial to the postero-medial edge of iliac spine. Region: Lumbosacral-sacrococcygeal. Approach: Inferior postero-medial percutaneous approach. Type of procedure: Percutaneous joint injection.  Position / Prep / Materials:  Position: Prone  Prep solution: ChloraPrep (2% chlorhexidine gluconate and 70% isopropyl alcohol) Prep  Area: Entire posterior lumbosacral area  Materials:  Tray: Block Needle(s):  Type: Spinal  Gauge (G): 22  Length: 3.5-in Qty: 1  H&P (Pre-op Assessment):  Beth Fox is a 73 y.o. (year old), female patient, seen today for interventional treatment. She  has a past surgical history that includes Breast surgery; Tubal ligation; Appendectomy; Mastectomy (Right, Oct 1997); Reduction mammaplasty (Left, 2012); and Foot surgery (Left, 06/2018). Beth Fox has a current medication list which includes the following prescription(s): acetaminophen, atorvastatin, vitamin d3, gabapentin, hydrochlorothiazide, mometasone, omeprazole, sertraline, tramadol, and trazodone. Her primarily concern today is the Hip Pain  Initial Vital Signs:  Pulse/HCG Rate: 91ECG Heart Rate: 81 Temp: 97.7 F (36.5 C) Resp: 16 BP: 127/79 SpO2: 98 %  BMI: Estimated body mass index is 28.34 kg/m as calculated from the following:   Height as of this encounter: 5\' 3"  (1.6 m).   Weight as of this encounter: 160 lb (72.6 kg).  Risk Assessment: Allergies: Reviewed. She has no known allergies.  Allergy Precautions: None required Coagulopathies: Reviewed. None identified.  Blood-thinner therapy: None at this time Active Infection(s): Reviewed. None identified. Beth Fox is afebrile  Site Confirmation: Beth Fox was asked to confirm the procedure and laterality before marking the site Procedure checklist: Completed Consent: Before the procedure and under the influence of no sedative(s), amnesic(s), or anxiolytics, the patient was informed of the treatment options, risks and possible complications. To fulfill our ethical and legal obligations, as recommended by the American Medical Association's Code of Ethics, I have informed the patient of my clinical impression; the nature and purpose of the treatment or procedure; the risks, benefits, and possible complications of the intervention; the alternatives, including doing  nothing; the risk(s) and benefit(s) of the alternative treatment(s) or procedure(s); and the risk(s) and benefit(s) of doing nothing. The patient was provided information about the general risks and  possible complications associated with the procedure. These may include, but are not limited to: failure to achieve desired goals, infection, bleeding, organ or nerve damage, allergic reactions, paralysis, and death. In addition, the patient was informed of those risks and complications associated to the procedure, such as failure to decrease pain; infection; bleeding; organ or nerve damage with subsequent damage to sensory, motor, and/or autonomic systems, resulting in permanent pain, numbness, and/or weakness of one or several areas of the body; allergic reactions; (i.e.: anaphylactic reaction); and/or death. Furthermore, the patient was informed of those risks and complications associated with the medications. These include, but are not limited to: allergic reactions (i.e.: anaphylactic or anaphylactoid reaction(s)); adrenal axis suppression; blood sugar elevation that in diabetics may result in ketoacidosis or comma; water retention that in patients with history of congestive heart failure may result in shortness of breath, pulmonary edema, and decompensation with resultant heart failure; weight gain; swelling or edema; medication-induced neural toxicity; particulate matter embolism and blood vessel occlusion with resultant organ, and/or nervous system infarction; and/or aseptic necrosis of one or more joints. Finally, the patient was informed that Medicine is not an exact science; therefore, there is also the possibility of unforeseen or unpredictable risks and/or possible complications that may result in a catastrophic outcome. The patient indicated having understood very clearly. We have given the patient no guarantees and we have made no promises. Enough time was given to the patient to ask questions, all of  which were answered to the patient's satisfaction. Beth Fox has indicated that she wanted to continue with the procedure. Attestation: I, the ordering provider, attest that I have discussed with the patient the benefits, risks, side-effects, alternatives, likelihood of achieving goals, and potential problems during recovery for the procedure that I have provided informed consent. Date  Time: 01/10/2024  1:28 PM  Pre-Procedure Preparation:  Monitoring: As per clinic protocol. Respiration, ETCO2, SpO2, BP, heart rate and rhythm monitor placed and checked for adequate function Safety Precautions: Patient was assessed for positional comfort and pressure points before starting the procedure. Time-out: I initiated and conducted the "Time-out" before starting the procedure, as per protocol. The patient was asked to participate by confirming the accuracy of the "Time Out" information. Verification of the correct person, site, and procedure were performed and confirmed by me, the nursing staff, and the patient. "Time-out" conducted as per Joint Commission's Universal Protocol (UP.01.01.01). Time: 1416 Start Time: 1416 hrs.  Description/Narrative of Procedure:          Start Time: 1416 hrs.  Rationale (medical necessity): procedure needed and proper for the diagnosis and/or treatment of the patient's medical symptoms and needs. Procedural Technique Safety Precautions: Aspiration looking for blood return was conducted prior to all injections. At no point did we inject any substances, as a needle was being advanced. No attempts were made at seeking any paresthesias. Safe injection practices and needle disposal techniques used. Medications properly checked for expiration dates. SDV (single dose vial) medications used. Description of the Procedure: Protocol guidelines were followed. The patient was assisted into a comfortable position. The target area was identified and the area prepped in the usual manner.  Skin & deeper tissues infiltrated with local anesthetic. Appropriate amount of time allowed to pass for local anesthetics to take effect. The procedure needles were then advanced to the target area. Proper needle placement secured. Negative aspiration confirmed. Solution injected in intermittent fashion, asking for systemic symptoms every 0.5cc of injectate. The needles were then removed and the area  cleansed, making sure to leave some of the prepping solution back to take advantage of its long term bactericidal properties.  Technical description of procedure:  Fluoroscopy using a posterior anterior 45 degree angle from the midline aiming at the anterolateral aspect of the patient was used to find a direct path into the sacroiliac joint, the superior medial to posterior superior iliac spine.  The skin was marked where the desired target and the skin infiltrated with local anesthetics.  The procedure needle was then advanced until the joint was entered.  Once inside of the joint, we then proceeded to inject the desired solution.  5cc solution made of 4cc of 0.2% ropivacaine, 1 cc of methylprednisolone, 40mg /cc. 5 cc injected into the right SI joint  A lright piriformis injection was also performed 1 cm inferior, 1 cm lateral and 1 cm deep to the inferior sacroiliac fissure with contrast highlighting piriformis muscle striations.  5 cc solution made of 4cc of 0.2% ropivacaine, 1 cc of Decadron, 10 mg/cc was injected into the right  piriformis muscle under fluoroscopy.     Vitals:   01/10/24 1409 01/10/24 1411 01/10/24 1417 01/10/24 1420  BP: (!) 146/86 134/83 127/80   Pulse:      Resp: 18 16 18 17   Temp:      SpO2: 95% 95% 95% 95%  Weight:      Height:         End Time: 1420 hrs.  Imaging Guidance (Non-Spinal):          Type of Imaging Technique: Fluoroscopy Guidance (Non-Spinal) Indication(s): Fluoroscopy guidance for needle placement to enhance accuracy in procedures requiring precise  needle localization for targeted delivery of medication in or near specific anatomical locations not easily accessible without such real-time imaging assistance. Exposure Time: Please see nurses notes. Contrast: Before injecting any contrast, we confirmed that the patient did not have an allergy to iodine, shellfish, or radiological contrast. Once satisfactory needle placement was completed at the desired level, radiological contrast was injected. Contrast injected under live fluoroscopy. No contrast complications. See chart for type and volume of contrast used. Fluoroscopic Guidance: I was personally present during the use of fluoroscopy. "Tunnel Vision Technique" used to obtain the best possible view of the target area. Parallax error corrected before commencing the procedure. "Direction-depth-direction" technique used to introduce the needle under continuous pulsed fluoroscopy. Once target was reached, antero-posterior, oblique, and lateral fluoroscopic projection used confirm needle placement in all planes. Images permanently stored in EMR. Interpretation: I personally interpreted the imaging intraoperatively. Adequate needle placement confirmed in multiple planes. Appropriate spread of contrast into desired area was observed. No evidence of afferent or efferent intravascular uptake. Permanent images saved into the patient's record.  Post-operative Assessment:  Post-procedure Vital Signs:  Pulse/HCG Rate: 9184 Temp: 97.7 F (36.5 C) Resp: 17 BP: 127/80 SpO2: 95 %  EBL: None  Complications: No immediate post-treatment complications observed by team, or reported by patient.  Note: The patient tolerated the entire procedure well. A repeat set of vitals were taken after the procedure and the patient was kept under observation following institutional policy, for this type of procedure. Post-procedural neurological assessment was performed, showing return to baseline, prior to discharge. The patient  was provided with post-procedure discharge instructions, including a section on how to identify potential problems. Should any problems arise concerning this procedure, the patient was given instructions to immediately contact us, at any time, without hesitation. In any case, we plan to contact the patient by telephone  for a follow-up status report regarding this interventional procedure.  Comments:  No additional relevant information.  Plan of Care (POC)  Orders:  Orders Placed This Encounter  Procedures   DG PAIN CLIN[facility] C-ARM 1-60 MIN NO REPORT    Intraoperative interpretation by procedural physician at [facility].    Standing Status:   Standing    Number of Occurrences:   1    Reason for exam::   Assistance in needle guidance and placement for procedures requiring needle placement in or near specific anatomical locations not easily accessible without such assistance.   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  She is status post diagnostic lumbar facet medial branch nerve block #1 on 08/25/2023 that provided with 100% pain relief for 2 days.  She went on to have a Sprint peripheral nerve stimulator which unfortunately was not helpful for long-term axial low back pain.  She is wondering what the next treatment steps are.  At this point, she does have limited options we discussed repeating diagnostic lumbar facet medial branch nerve block #2 and if she gets similar pain relief for better than she did with her first one she could be a candidate for lumbar radiofrequency ablation.  Medications ordered for procedure: Meds ordered this encounter  Medications   iohexol (OMNIPAQUE) 180 MG/ML injection 10 mL    Must be Myelogram-compatible. If not available, you may substitute with a water-soluble, non-ionic, hypoallergenic, myelogram-compatible radiological contrast medium.   lidocaine (XYLOCAINE) 2 % (with pres)  injection 400 mg   diazepam (VALIUM) tablet 5 mg    Make sure Flumazenil is available in the pyxis when using this medication. If oversedation occurs, administer 0.2 mg IV over 15 sec. If after 45 sec no response, administer 0.2 mg again over 1 min; may repeat at 1 min intervals; not to exceed 4 doses (1 mg)   methylPREDNISolone acetate (DEPO-MEDROL) injection 40 mg   dexamethasone (DECADRON) injection 10 mg   ropivacaine (PF) 2 mg/mL (0.2%) (NAROPIN) injection 9 mL   Medications administered: We administered iohexol, lidocaine, diazepam, methylPREDNISolone acetate, dexamethasone, and ropivacaine (PF) 2 mg/mL (0.2%).  See the medical record for exact dosing, route, and time of administration.  Follow-up plan:   Return in about 3 weeks (around 01/31/2024) for PPE, F2F (discuss MBNB #2 and lumbar facet).       Recent Visits Date Type Provider Dept  12/07/23 Procedure visit Edward Jolly, MD [facility]  11/03/23 Office Visit Edward Jolly, MD [facility]  Showing recent visits within past 90 days and meeting all other requirements Today's Visits Date Type Provider Dept  01/10/24 Office Visit Edward Jolly, MD [facility]  Showing today's visits and meeting all other requirements Future Appointments No visits were found meeting these conditions. Showing future appointments within next 90 days and meeting all other requirements  Disposition: Discharge home  Discharge (Date  Time): 01/10/2024; 1430 hrs.   Primary Care Physician: Excell Seltzer, MD Location: [facility] Note by: Edward Jolly, MD (TTS technology used. I apologize for any typographical errors that were not detected and corrected.) Date: 01/10/2024; Time: 2:21 PM  Disclaimer:  Medicine is not an Visual merchandiser. The only guarantee in medicine is that nothing is guaranteed. It is important to note that the decision to proceed with this intervention was based on the  information collected from the patient. The Data and conclusions were drawn  from the patient's questionnaire, the interview, and the physical examination. Because the information was provided in large part by the patient, it cannot be guaranteed that it has not been purposely or unconsciously manipulated. Every effort has been made to obtain as much relevant data as possible for this evaluation. It is important to note that the conclusions that lead to this procedure are derived in large part from the available data. Always take into account that the treatment will also be dependent on availability of resources and existing treatment guidelines, considered by other Pain Management Practitioners as being common knowledge and practice, at the time of the intervention. For Medico-Legal purposes, it is also important to point out that variation in procedural techniques and pharmacological choices are the acceptable norm. The indications, contraindications, technique, and results of the above procedure should only be interpreted and judged by a Board-Certified Interventional Pain Specialist with extensive familiarity and expertise in the same exact procedure and technique.

## 2024-01-11 ENCOUNTER — Telehealth: Payer: Self-pay | Admitting: *Deleted

## 2024-01-11 NOTE — Telephone Encounter (Signed)
 No problems post procedure.

## 2024-01-31 ENCOUNTER — Ambulatory Visit
Attending: Student in an Organized Health Care Education/Training Program | Admitting: Student in an Organized Health Care Education/Training Program

## 2024-01-31 ENCOUNTER — Ambulatory Visit
Admission: RE | Admit: 2024-01-31 | Discharge: 2024-01-31 | Disposition: A | Discharge status: Home / Self Care | Source: Ambulatory Visit | Attending: Student in an Organized Health Care Education/Training Program | Admitting: Student in an Organized Health Care Education/Training Program

## 2024-01-31 ENCOUNTER — Encounter: Payer: Self-pay | Admitting: Family Medicine

## 2024-01-31 ENCOUNTER — Other Ambulatory Visit: Payer: Self-pay | Admitting: Student in an Organized Health Care Education/Training Program

## 2024-01-31 ENCOUNTER — Encounter: Payer: Self-pay | Admitting: Student in an Organized Health Care Education/Training Program

## 2024-01-31 ENCOUNTER — Other Ambulatory Visit: Payer: Self-pay | Admitting: Family Medicine

## 2024-01-31 VITALS — BP 123/63 | HR 67 | Temp 97.2°F | Resp 16 | Ht 63.0 in | Wt 155.0 lb

## 2024-01-31 DIAGNOSIS — M47816 Spondylosis without myelopathy or radiculopathy, lumbar region: Secondary | ICD-10-CM | POA: Insufficient documentation

## 2024-01-31 DIAGNOSIS — G894 Chronic pain syndrome: Secondary | ICD-10-CM | POA: Insufficient documentation

## 2024-01-31 DIAGNOSIS — M5136 Other intervertebral disc degeneration, lumbar region with discogenic back pain only: Secondary | ICD-10-CM | POA: Insufficient documentation

## 2024-01-31 MED ORDER — GABAPENTIN 100 MG PO CAPS: 100.0000 mg | ORAL_CAPSULE | Freq: Two times a day (BID) | ORAL | 5 refills | Status: DC

## 2024-01-31 MED ORDER — MIDAZOLAM HCL 2 MG/2ML IJ SOLN
0.5000 mg | Freq: Once | INTRAMUSCULAR | Status: AC
  Administered 2024-01-31: 2 mg via INTRAVENOUS

## 2024-01-31 MED ORDER — DEXAMETHASONE SODIUM PHOSPHATE 10 MG/ML IJ SOLN
INTRAMUSCULAR | Status: AC
  Filled 2024-01-31: qty 2

## 2024-01-31 MED ORDER — DEXAMETHASONE SODIUM PHOSPHATE 10 MG/ML IJ SOLN
20.0000 mg | Freq: Once | INTRAMUSCULAR | Status: AC
  Administered 2024-01-31: 20 mg

## 2024-01-31 MED ORDER — ROPIVACAINE HCL 2 MG/ML IJ SOLN
18.0000 mL | Freq: Once | INTRAMUSCULAR | Status: AC
  Administered 2024-01-31: 18 mL via PERINEURAL

## 2024-01-31 MED ORDER — LACTATED RINGERS IV SOLN: Freq: Once | INTRAVENOUS | Status: AC

## 2024-01-31 MED ORDER — LIDOCAINE HCL 2 % IJ SOLN
20.0000 mL | Freq: Once | INTRAMUSCULAR | Status: AC
  Administered 2024-01-31: 400 mg

## 2024-01-31 MED ORDER — LIDOCAINE HCL 2 % IJ SOLN
INTRAMUSCULAR | Status: AC
  Filled 2024-01-31: qty 20

## 2024-01-31 MED ORDER — MIDAZOLAM HCL 2 MG/2ML IJ SOLN
INTRAMUSCULAR | Status: AC
  Filled 2024-01-31: qty 2

## 2024-01-31 MED ORDER — ROPIVACAINE HCL 2 MG/ML IJ SOLN
INTRAMUSCULAR | Status: AC
  Filled 2024-01-31: qty 20

## 2024-01-31 NOTE — Progress Notes (Signed)
 Safety precautions to be maintained throughout the outpatient stay will include: orient to surroundings, keep bed in low position, maintain call bell within reach at all times, provide assistance with transfer out of bed and ambulation.

## 2024-01-31 NOTE — Progress Notes (Signed)
 PROVIDER NOTE: Interpretation of information contained herein should be left to medically-trained personnel. Specific patient instructions are provided elsewhere under "Patient Instructions" section of medical record. This document was created in part using STT-dictation technology, any transcriptional errors that may result from this process are unintentional.  Patient: Beth Fox Type: Established DOB: 12-22-1950 MRN: [id number] PCP: Judithann Novas, MD  Service: Procedure DOS: 01/31/2024 Setting: Ambulatory Location: Ambulatory outpatient facility Delivery: Face-to-face Provider: Cephus Collin, MD Specialty: Interventional Pain Management Specialty designation: 09 Location: Outpatient facility Ref. Prov.: Cephus Collin, MD       Interventional Therapy   Type: Lumbar Facet, Medial Branch Block(s) (w/ fluoroscopic mapping) #2  Laterality: Bilateral  Level: L3, L4, and L5 Medial Branch Level(s). Injecting these levels blocks the L3-4 and L4-5 lumbar facet joints. Imaging: Fluoroscopic guidance         Anesthesia: Local anesthesia (1-2% Lidocaine ) Sedation: Minimal Sedation                       DOS: 01/31/2024 Performed by: Cephus Collin, MD  Primary Purpose: Diagnostic/Therapeutic Indications: Low back pain severe enough to impact quality of life or function. 1. Degeneration of intervertebral disc of lumbar region with discogenic back pain   2. Lumbar facet arthropathy   3. Chronic pain syndrome    NAS-11 Pain score:   Pre-procedure: 9 /10   Post-procedure: 9 /10     Position / Prep / Materials:  Position: Prone  Prep solution: ChloraPrep (2% chlorhexidine gluconate and 70% isopropyl alcohol) Area Prepped: Posterolateral Lumbosacral Spine (Wide prep: From the lower border of the scapula down to the end of the tailbone and from flank to flank.)  Materials:  Tray: Block Needle(s):  Type: Spinal  Gauge (G): 22  Length: 3.5-in Qty: 2      H&P (Pre-op Assessment):  Ms.  Fox is a 73 y.o. (year old), female patient, seen today for interventional treatment. She  has a past surgical history that includes Breast surgery; Tubal ligation; Appendectomy; Mastectomy (Right, Oct 1997); Reduction mammaplasty (Left, 2012); and Foot surgery (Left, 06/2018). Beth Fox has a current medication list which includes the following prescription(s): acetaminophen , vitamin d3, hydrochlorothiazide , mometasone , omeprazole , sertraline , tramadol, trazodone , atorvastatin , and gabapentin , and the following Facility-Administered Medications: lactated ringers . Her primarily concern today is the Back Pain (Bilateral lumbar )  Initial Vital Signs:  Pulse/HCG Rate: 70  Temp: (!) 97.2 F (36.2 C) Resp: 16 BP: 134/80 SpO2: 97 %  BMI: Estimated body mass index is 27.46 kg/m as calculated from the following:   Height as of this encounter: 5\' 3"  (1.6 m).   Weight as of this encounter: 155 lb (70.3 kg).  Risk Assessment: Allergies: Reviewed. She has no known allergies.  Allergy Precautions: None required Coagulopathies: Reviewed. None identified.  Blood-thinner therapy: None at this time Active Infection(s): Reviewed. None identified. Beth Fox is afebrile  Site Confirmation: Beth Fox was asked to confirm the procedure and laterality before marking the site Procedure checklist: Completed Consent: Before the procedure and under the influence of no sedative(s), amnesic(s), or anxiolytics, the patient was informed of the treatment options, risks and possible complications. To fulfill our ethical and legal obligations, as recommended by the American Medical Association's Code of Ethics, I have informed the patient of my clinical impression; the nature and purpose of the treatment or procedure; the risks, benefits, and possible complications of the intervention; the alternatives, including doing nothing; the risk(s) and benefit(s) of the alternative treatment(s) or  procedure(s); and the  risk(s) and benefit(s) of doing nothing. The patient was provided information about the general risks and possible complications associated with the procedure. These may include, but are not limited to: failure to achieve desired goals, infection, bleeding, organ or nerve damage, allergic reactions, paralysis, and death. In addition, the patient was informed of those risks and complications associated to Spine-related procedures, such as failure to decrease pain; infection (i.e.: Meningitis, epidural or intraspinal abscess); bleeding (i.e.: epidural hematoma, subarachnoid hemorrhage, or any other type of intraspinal or peri-dural bleeding); organ or nerve damage (i.e.: Any type of peripheral nerve, nerve root, or spinal cord injury) with subsequent damage to sensory, motor, and/or autonomic systems, resulting in permanent pain, numbness, and/or weakness of one or several areas of the body; allergic reactions; (i.e.: anaphylactic reaction); and/or death. Furthermore, the patient was informed of those risks and complications associated with the medications. These include, but are not limited to: allergic reactions (i.e.: anaphylactic or anaphylactoid reaction(s)); adrenal axis suppression; blood sugar elevation that in diabetics may result in ketoacidosis or comma; water retention that in patients with history of congestive heart failure may result in shortness of breath, pulmonary edema, and decompensation with resultant heart failure; weight gain; swelling or edema; medication-induced neural toxicity; particulate matter embolism and blood vessel occlusion with resultant organ, and/or nervous system infarction; and/or aseptic necrosis of one or more joints. Finally, the patient was informed that Medicine is not an exact science; therefore, there is also the possibility of unforeseen or unpredictable risks and/or possible complications that may result in a catastrophic outcome. The patient indicated having  understood very clearly. We have given the patient no guarantees and we have made no promises. Enough time was given to the patient to ask questions, all of which were answered to the patient's satisfaction. Ms. Woolridge has indicated that she wanted to continue with the procedure. Attestation: I, the ordering provider, attest that I have discussed with the patient the benefits, risks, side-effects, alternatives, likelihood of achieving goals, and potential problems during recovery for the procedure that I have provided informed consent. Date  Time: 01/31/2024 10:02 AM   Pre-Procedure Preparation:  Monitoring: As per clinic protocol. Respiration, ETCO2, SpO2, BP, heart rate and rhythm monitor placed and checked for adequate function Safety Precautions: Patient was assessed for positional comfort and pressure points before starting the procedure. Time-out: I initiated and conducted the "Time-out" before starting the procedure, as per protocol. The patient was asked to participate by confirming the accuracy of the "Time Out" information. Verification of the correct person, site, and procedure were performed and confirmed by me, the nursing staff, and the patient. "Time-out" conducted as per Joint Commission's Universal Protocol (UP.01.01.01). Time: 1103 Start Time: 1103 hrs.  Description of Procedure:          Laterality: (see above) Targeted Levels: (see above)  Safety Precautions: Aspiration looking for blood return was conducted prior to all injections. At no point did we inject any substances, as a needle was being advanced. Before injecting, the patient was told to immediately notify me if she was experiencing any new onset of "ringing in the ears, or metallic taste in the mouth". No attempts were made at seeking any paresthesias. Safe injection practices and needle disposal techniques used. Medications properly checked for expiration dates. SDV (single dose vial) medications used. After the  completion of the procedure, all disposable equipment used was discarded in the proper designated medical waste containers. Local Anesthesia: Protocol guidelines were followed. The  patient was positioned over the fluoroscopy table. The area was prepped in the usual manner. The time-out was completed. The target area was identified using fluoroscopy. A 12-in long, straight, sterile hemostat was used with fluoroscopic guidance to locate the targets for each level blocked. Once located, the skin was marked with an approved surgical skin marker. Once all sites were marked, the skin (epidermis, dermis, and hypodermis), as well as deeper tissues (fat, connective tissue and muscle) were infiltrated with a small amount of a short-acting local anesthetic, loaded on a 10cc syringe with a 25G, 1.5-in  Needle. An appropriate amount of time was allowed for local anesthetics to take effect before proceeding to the next step. Local Anesthetic: Lidocaine  2.0% The unused portion of the local anesthetic was discarded in the proper designated containers. Technical description of process:  L3 Medial Branch Nerve Block (MBB): The target area for the L3 medial branch is at the junction of the postero-lateral aspect of the superior articular process and the superior, posterior, and medial edge of the transverse process of L4. Under fluoroscopic guidance, a Quincke needle was inserted until contact was made with os over the superior postero-lateral aspect of the pedicular shadow (target area). After negative aspiration for blood, 2mL of the nerve block solution was injected without difficulty or complication. The needle was removed intact. L4 Medial Branch Nerve Block (MBB): The target area for the L4 medial branch is at the junction of the postero-lateral aspect of the superior articular process and the superior, posterior, and medial edge of the transverse process of L5. Under fluoroscopic guidance, a Quincke needle was inserted  until contact was made with os over the superior postero-lateral aspect of the pedicular shadow (target area). After negative aspiration for blood, 2mL of the nerve block solution was injected without difficulty or complication. The needle was removed intact. L5 Medial Branch Nerve Block (MBB): The target area for the L5 medial branch is at the junction of the postero-lateral aspect of the superior articular process and the superior, posterior, and medial edge of the sacral ala. Under fluoroscopic guidance, a Quincke needle was inserted until contact was made with os over the superior postero-lateral aspect of the pedicular shadow (target area). After negative aspiration for blood, 2 mL of the nerve block solution was injected without difficulty or complication. The needle was removed intact.   Once the entire procedure was completed, the treated area was cleaned, making sure to leave some of the prepping solution back to take advantage of its long term bactericidal properties.         Illustration of the posterior view of the lumbar spine and the posterior neural structures. Laminae of L2 through S1 are labeled. DPRL5, dorsal primary ramus of L5; DPRS1, dorsal primary ramus of S1; DPR3, dorsal primary ramus of L3; FJ, facet (zygapophyseal) joint L3-L4; I, inferior articular process of L4; LB1, lateral branch of dorsal primary ramus of L1; IAB, inferior articular branches from L3 medial branch (supplies L4-L5 facet joint); IBP, intermediate branch plexus; MB3, medial branch of dorsal primary ramus of L3; NR3, third lumbar nerve root; S, superior articular process of L5; SAB, superior articular branches from L4 (supplies L4-5 facet joint also); TP3, transverse process of L3.   Facet Joint Innervation (* possible contribution)  L1-2 T12, L1 (L2*)  Medial Branch  L2-3 L1, L2 (L3*)         "          "  L3-4 L2, L3 (L4*)         "          "  L4-5 L3, L4 (L5*)         "          "  L5-S1 L4, L5, S1           "          "    Vitals:   01/31/24 1103 01/31/24 1108 01/31/24 1113 01/31/24 1120  BP: 123/85 121/76 125/79 123/63  Pulse: 65 71 73 67  Resp: 15 16 15 16   Temp:      TempSrc:      SpO2: 99% 98% 97% 92%  Weight:      Height:         End Time: 1113 hrs.  Imaging Guidance (Spinal):          Type of Imaging Technique: Fluoroscopy Guidance (Spinal) Indication(s): Fluoroscopy guidance for needle placement to enhance accuracy in procedures requiring precise needle localization for targeted delivery of medication in or near specific anatomical locations not easily accessible without such real-time imaging assistance. Exposure Time: Please see nurses notes. Contrast: None used. Fluoroscopic Guidance: I was personally present during the use of fluoroscopy. "Tunnel Vision Technique" used to obtain the best possible view of the target area. Parallax error corrected before commencing the procedure. "Direction-depth-direction" technique used to introduce the needle under continuous pulsed fluoroscopy. Once target was reached, antero-posterior, oblique, and lateral fluoroscopic projection used confirm needle placement in all planes. Images permanently stored in EMR. Interpretation: No contrast injected. I personally interpreted the imaging intraoperatively. Adequate needle placement confirmed in multiple planes. Permanent images saved into the patient's record.  Post-operative Assessment:  Post-procedure Vital Signs:  Pulse/HCG Rate: 67  Temp: (!) 97.2 F (36.2 C) Resp: 16 BP: 123/63 SpO2: 92 %  EBL: None  Complications: No immediate post-treatment complications observed by team, or reported by patient.  Note: The patient tolerated the entire procedure well. A repeat set of vitals were taken after the procedure and the patient was kept under observation following institutional policy, for this type of procedure. Post-procedural neurological assessment was performed, showing return to  baseline, prior to discharge. The patient was provided with post-procedure discharge instructions, including a section on how to identify potential problems. Should any problems arise concerning this procedure, the patient was given instructions to immediately contact us , at any time, without hesitation. In any case, we plan to contact the patient by telephone for a follow-up status report regarding this interventional procedure.  Comments:  No additional relevant information.  Plan of Care (POC)  Orders:  Orders Placed This Encounter  Procedures   DG PAIN [facility] C-ARM 1-60 MIN NO REPORT    Intraoperative interpretation by procedural physician at [facility].    Standing Status:   Standing    Number of Occurrences:   1    Reason for exam::   Assistance in needle guidance and placement for procedures requiring needle placement in or near specific anatomical locations not easily accessible without such assistance.    Medications ordered for procedure: Meds ordered this encounter  Medications   lidocaine  (XYLOCAINE ) 2 % (with pres) injection 400 mg   lactated ringers  infusion   midazolam  (VERSED ) injection 0.5-2 mg    Make sure Flumazenil is available in the pyxis when using this medication. If oversedation occurs, administer 0.2 mg IV over 15 sec. If after 45 sec no response, administer 0.2 mg again over 1 min; may repeat at 1 min intervals; not to exceed 4 doses (1 mg)   ropivacaine  (PF) 2  mg/mL (0.2%) (NAROPIN ) injection 18 mL   dexamethasone  (DECADRON ) injection 20 mg   gabapentin  (NEURONTIN ) 100 MG capsule    Sig: Take 1 capsule (100 mg total) by mouth 2 (two) times daily. Take 1 capsule by mouth every morning and 3 capsules by mouth at bedtime    Dispense:  60 capsule    Refill:  5   Medications administered: We administered lidocaine , lactated ringers , midazolam , ropivacaine  (PF) 2 mg/mL (0.2%), and dexamethasone .  See the medical record for exact dosing, route, and time  of administration.  Follow-up plan:   Return in about 17 days (around 02/17/2024) for PPE, F2F.       Recent Visits Date Type Provider Dept  01/10/24 Office Visit Cephus Collin, MD [facility]  12/07/23 Procedure visit Cephus Collin, MD [facility]  11/03/23 Office Visit Cephus Collin, MD [facility]  Showing recent visits within past 90 days and meeting all other requirements Today's Visits Date Type Provider Dept  01/31/24 Procedure visit Cephus Collin, MD [facility]  Showing today's visits and meeting all other requirements Future Appointments Date Type Provider Dept  02/22/24 Appointment Cephus Collin, MD [facility]  Showing future appointments within next 90 days and meeting all other requirements  Disposition: Discharge home  Discharge (Date  Time): 01/31/2024; 1121 hrs.   Primary Care Physician: Judithann Novas, MD Location: [facility] Note by: Cephus Collin, MD (TTS technology used. I apologize for any typographical errors that were not detected and corrected.) Date: 01/31/2024; Time: 11:24 AM  Disclaimer:  Medicine is not an Visual merchandiser. The only guarantee in medicine is that nothing is guaranteed. It is important to note that the decision to proceed with this intervention was based on the information collected from the patient. The Data and conclusions were drawn from the patient's questionnaire, the interview, and the physical examination. Because the information was provided in large part by the patient, it cannot be guaranteed that it has not been purposely or unconsciously manipulated. Every effort has been made to obtain as much relevant data as possible for this evaluation. It is important to note that the conclusions that lead to this procedure are derived in large part from the available data. Always take into account that the treatment will also be dependent on availability of resources  and existing treatment guidelines, considered by other Pain Management Practitioners as being common knowledge and practice, at the time of the intervention. For Medico-Legal purposes, it is also important to point out that variation in procedural techniques and pharmacological choices are the acceptable norm. The indications, contraindications, technique, and results of the above procedure should only be interpreted and judged by a Board-Certified Interventional Pain Specialist with extensive familiarity and expertise in the same exact procedure and technique.

## 2024-01-31 NOTE — Patient Instructions (Signed)

## 2024-01-31 NOTE — Telephone Encounter (Signed)
 Per last note on 12/15/23 INSOMNIA, CHRONIC (Chronic)     Chronic, inadequate control... increase trazodone  to 150 mg daily... if ineffective can consider trial of amitriptyline given back pain issues as well.       Reached out to patient she would like to go back on the 100mg  of trazodone . States she is having increased nightmares. Would like to try Amitriptyline as discussed at last visit. Advised we will reach out once Dr. Cherlyn Cornet as reviewed when back in office.

## 2024-02-01 ENCOUNTER — Telehealth: Payer: Self-pay | Admitting: *Deleted

## 2024-02-01 NOTE — Telephone Encounter (Signed)
 Attempted to call for post procedure follow-up. Message left.

## 2024-02-15 MED ORDER — AMITRIPTYLINE HCL 25 MG PO TABS: 25.0000 mg | ORAL_TABLET | Freq: Every day | ORAL | 3 refills | Status: DC

## 2024-02-22 ENCOUNTER — Ambulatory Visit
Attending: Student in an Organized Health Care Education/Training Program | Admitting: Student in an Organized Health Care Education/Training Program

## 2024-02-22 VITALS — BP 127/78 | HR 69 | Temp 97.5°F | Ht 63.0 in | Wt 155.0 lb

## 2024-02-22 DIAGNOSIS — M5136 Other intervertebral disc degeneration, lumbar region with discogenic back pain only: Secondary | ICD-10-CM | POA: Insufficient documentation

## 2024-02-22 DIAGNOSIS — M47816 Spondylosis without myelopathy or radiculopathy, lumbar region: Secondary | ICD-10-CM | POA: Diagnosis present

## 2024-02-22 DIAGNOSIS — G894 Chronic pain syndrome: Secondary | ICD-10-CM | POA: Insufficient documentation

## 2024-02-22 NOTE — Patient Instructions (Addendum)
 ______________________________________________________________________    General Risks and Possible Complications  Patient Responsibilities: It is important that you read this as it is part of your informed consent. It is our duty to inform you of the risks and possible complications associated with treatments offered to you. It is your responsibility as a patient to read this and to ask questions about anything that is not clear or that you believe was not covered in this document.  Patient's Rights: You have the right to refuse treatment. You also have the right to change your mind, even after initially having agreed to have the treatment done. However, under this last option, if you wait until the last second to change your mind, you may be charged for the materials used up to that point.  Introduction: Medicine is not an Visual merchandiser. Everything in Medicine, including the lack of treatment(s), carries the potential for danger, harm, or loss (which is by definition: Risk). In Medicine, a complication is a secondary problem, condition, or disease that can aggravate an already existing one. All treatments carry the risk of possible complications. The fact that a side effects or complications occurs, does not imply that the treatment was conducted incorrectly. It must be clearly understood that these can happen even when everything is done following the highest safety standards.  No treatment: You can choose not to proceed with the proposed treatment alternative. The "PRO(s)" would include: avoiding the risk of complications associated with the therapy. The "CON(s)" would include: not getting any of the treatment benefits. These benefits fall under one of three categories: diagnostic; therapeutic; and/or palliative. Diagnostic benefits include: getting information which can ultimately lead to improvement of the disease or symptom(s). Therapeutic benefits are those associated with the successful  treatment of the disease. Finally, palliative benefits are those related to the decrease of the primary symptoms, without necessarily curing the condition (example: decreasing the pain from a flare-up of a chronic condition, such as incurable terminal cancer).  General Risks and Complications: These are associated to most interventional treatments. They can occur alone, or in combination. They fall under one of the following six (6) categories: no benefit or worsening of symptoms; bleeding; infection; nerve damage; allergic reactions; and/or death. No benefits or worsening of symptoms: In Medicine there are no guarantees, only probabilities. No healthcare provider can ever guarantee that a medical treatment will work, they can only state the probability that it may. Furthermore, there is always the possibility that the condition may worsen, either directly, or indirectly, as a consequence of the treatment. Bleeding: This is more common if the patient is taking a blood thinner, either prescription or over the counter (example: Goody Powders, Fish oil, Aspirin, Garlic, etc.), or if suffering a condition associated with impaired coagulation (example: Hemophilia, cirrhosis of the liver, low platelet counts, etc.). However, even if you do not have one on these, it can still happen. If you have any of these conditions, or take one of these drugs, make sure to notify your treating physician. Infection: This is more common in patients with a compromised immune system, either due to disease (example: diabetes, cancer, human immunodeficiency virus [HIV], etc.), or due to medications or treatments (example: therapies used to treat cancer and rheumatological diseases). However, even if you do not have one on these, it can still happen. If you have any of these conditions, or take one of these drugs, make sure to notify your treating physician. Nerve Damage: This is more common when the treatment is  an invasive one, but it  can also happen with the use of medications, such as those used in the treatment of cancer. The damage can occur to small secondary nerves, or to large primary ones, such as those in the spinal cord and brain. This damage may be temporary or permanent and it may lead to impairments that can range from temporary numbness to permanent paralysis and/or brain death. Allergic Reactions: Any time a substance or material comes in contact with our body, there is the possibility of an allergic reaction. These can range from a mild skin rash (contact dermatitis) to a severe systemic reaction (anaphylactic reaction), which can result in death. Death: In general, any medical intervention can result in death, most of the time due to an unforeseen complication. ______________________________________________________________________      ______________________________________________________________________    Preparing for your procedure  Appointments: If you think you may not be able to keep your appointment, call 24-48 hours in advance to cancel. We need time to make it available to others.  Procedure visits are for procedures only. During your procedure appointment there will be: NO Prescription Refills*. NO medication changes or discussions*. NO discussion of disability issues*. NO unrelated pain problem evaluations*. NO evaluations to order other pain procedures*. *These will be addressed at a separate and distinct evaluation encounter on the provider's evaluation schedule and not during procedure days.  Instructions: Food intake: Avoid eating anything solid for at least 8 hours prior to your procedure. Clear liquid intake: You may take clear liquids such as water up to 2 hours prior to your procedure. (No carbonated drinks. No soda.) Transportation: Unless otherwise stated by your physician, bring a driver. (Driver cannot be a Market researcher, Pharmacist, community, or any other form of public transportation.) Morning  Medicines: Except for blood thinners, take all of your other morning medications with a sip of water. Make sure to take your heart and blood pressure medicines. If your blood pressure's lower number is above 100, the case will be rescheduled. Blood thinners: Make sure to stop your blood thinners as instructed.  If you take a blood thinner, but were not instructed to stop it, call our office [phone] and ask to talk to a nurse. Not stopping a blood thinner prior to certain procedures could lead to serious complications. Diabetics on insulin: Notify the staff so that you can be scheduled 1st case in the morning. If your diabetes requires high dose insulin, take only  of your normal insulin dose the morning of the procedure and notify the staff that you have done so. Preventing infections: Shower with an antibacterial soap the morning of your procedure.  Build-up your immune system: Take 1000 mg of Vitamin C with every meal (3 times a day) the day prior to your procedure. Antibiotics: Inform the nursing staff if you are taking any antibiotics or if you have any conditions that may require antibiotics prior to procedures. (Example: recent joint implants)   Pregnancy: If you are pregnant make sure to notify the nursing staff. Not doing so may result in injury to the fetus, including death.  Sickness: If you have a cold, fever, or any active infections, call and cancel or reschedule your procedure. Receiving steroids while having an infection may result in complications. Arrival: You must be in the facility at least 30 minutes prior to your scheduled procedure. Tardiness: Your scheduled time is also the cutoff time. If you do not arrive at least 15 minutes prior to your procedure, you will  be rescheduled.  Children: Do not bring any children with you. Make arrangements to keep them home. Dress appropriately: There is always a possibility that your clothing may get soiled. Avoid long dresses. Valuables:  Do not bring any jewelry or valuables.  Reasons to call and reschedule or cancel your procedure: (Following these recommendations will minimize the risk of a serious complication.) Surgeries: Avoid having procedures within 2 weeks of any surgery. (Avoid for 2 weeks before or after any surgery). Flu Shots: Avoid having procedures within 2 weeks of a flu shots or . (Avoid for 2 weeks before or after immunizations). Barium: Avoid having a procedure within 7-10 days after having had a radiological study involving the use of radiological contrast. (Myelograms, Barium swallow or enema study). Heart attacks: Avoid any elective procedures or surgeries for the initial 6 months after a "Myocardial Infarction" (Heart Attack). Blood thinners: It is imperative that you stop these medications before procedures. Let us know if you if you take any blood thinner.  Infection: Avoid procedures during or within two weeks of an infection (including chest colds or gastrointestinal problems). Symptoms associated with infections include: Localized redness, fever, chills, night sweats or profuse sweating, burning sensation when voiding, cough, congestion, stuffiness, runny nose, sore throat, diarrhea, nausea, vomiting, cold or Flu symptoms, recent or current infections. It is specially important if the infection is over the area that we intend to treat. Heart and lung problems: Symptoms that may suggest an active cardiopulmonary problem include: cough, chest pain, breathing difficulties or shortness of breath, dizziness, ankle swelling, uncontrolled high or unusually low blood pressure, and/or palpitations. If you are experiencing any of these symptoms, cancel your procedure and contact your primary care physician for an evaluation.  Remember:  Regular Business hours are:  Monday to Thursday 8:00 AM to 4:00 PM  Provider's Schedule: Delano Metz, MD:  Procedure days: Tuesday and Thursday 7:30 AM to 4:00 PM  Edward Jolly, MD:  Procedure days: Monday and Wednesday 7:30 AM to 4:00 PM Last  Updated: 09/21/2023 ______________________________________________________________________     Radiofrequency Ablation Radiofrequency ablation is a procedure that is performed to relieve pain. The procedure is often used for back, neck, or arm pain. Radiofrequency ablation involves the use of a machine that creates radio waves to make heat. During the procedure, the heat is applied to the nerve that carries the pain signal. The heat damages the nerve and interferes with the pain signal. Pain relief usually starts about 2 weeks after the procedure and lasts for 6 months to 1 year. Tell a health care provider about: Any allergies you have. All medicines you are taking, including vitamins, herbs, eye drops, creams, and over-the-counter medicines. Any problems you or family members have had with anesthetic medicines. Any bleeding problems you have. Any surgeries you have had. Any medical conditions you have. Whether you are pregnant or may be pregnant. What are the risks? Generally, this is a safe procedure. However, problems may occur, including: Pain or soreness at the injection site. Allergic reaction to medicines given during the procedure. Bleeding. Infection at the injection site. Damage to nerves or blood vessels. What happens before the procedure? When to stop eating and drinking Follow instructions from your health care provider about what you may eat and drink before your procedure. These may include: 8 hours before the procedure Stop eating most foods. Do not eat meat, fried foods, or fatty foods. Eat only light foods, such as toast or crackers. All liquids are okay except  energy drinks and alcohol. 6 hours before the procedure Stop eating. Drink only clear liquids, such as water, clear fruit juice, black coffee, plain tea, and sports drinks. Do not drink energy drinks or alcohol. 2 hours before the  procedure Stop drinking all liquids. You may be allowed to take medicine with small sips of water. If you do not follow your health care provider's instructions, your procedure may be delayed or canceled. Medicines Ask your health care provider about: Changing or stopping your regular medicines. This is especially important if you are taking diabetes medicines or blood thinners. Taking medicines such as aspirin and ibuprofen. These medicines can thin your blood. Do not take these medicines unless your health care provider tells you to take them. Taking over-the-counter medicines, vitamins, herbs, and supplements. General instructions Ask your health care provider what steps will be taken to help prevent infection. These steps may include: Removing hair at the procedure site. Washing skin with a germ-killing soap. Taking antibiotic medicine. If you will be going home right after the procedure, plan to have a responsible adult: Take you home from the hospital or clinic. You will not be allowed to drive. Care for you for the time you are told. What happens during the procedure?  You will be awake during the procedure. You will need to be able to talk with the health care provider during the procedure. An IV will be inserted into one of your veins. You will be given one or more of the following: A medicine to help you relax (sedative). A medicine to numb the area (local anesthetic). Your health care provider will insert a radiofrequency needle into the area to be treated. This is done with the help of fluoroscopy. A wire that carries the radio waves (electrode) will be put through the radiofrequency needle. An electrical pulse will be sent through the electrode to verify the correct nerve that is causing your pain. You will feel a tingling sensation, and you may have muscle twitching. The tissue around the needle tip will be heated by an electric current that comes from the radiofrequency  machine. This will numb the nerves. The needle will be removed. A bandage (dressing) will be put on the insertion area. The procedure may vary among health care providers and hospitals. What happens after the procedure? Your blood pressure, heart rate, breathing rate, and blood oxygen level will be monitored until you leave the hospital or clinic. Return to your normal activities as told by your health care provider. Ask your health care provider what activities are safe for you. If you were given a sedative during the procedure, it can affect you for several hours. Do not drive or operate machinery until your health care provider says that it is safe. Summary Radiofrequency ablation is a procedure that is performed to relieve pain. The procedure is often used for back, neck, or arm pain. Radiofrequency ablation involves the use of a machine that creates radio waves to make heat. Plan to have a responsible adult take you home from the hospital or clinic. Do not drive or operate machinery until your health care provider says that it is safe. Return to your normal activities as told by your health care provider. Ask your health care provider what activities are safe for you. This information is not intended to replace advice given to you by your health care provider. Make sure you discuss any questions you have with your health care provider. Document Revised: 03/18/2021 Document Reviewed: 03/18/2021  [employer]vier Patient Education  2024 [employer].

## 2024-02-22 NOTE — Progress Notes (Signed)
 Safety precautions to be maintained throughout the outpatient stay will include: orient to surroundings, keep bed in low position, maintain call bell within reach at all times, provide assistance with transfer out of bed and ambulation.

## 2024-02-22 NOTE — Progress Notes (Signed)
 PROVIDER NOTE: Interpretation of information contained herein should be left to medically-trained personnel. Specific patient instructions are provided elsewhere under "Patient Instructions" section of medical record. This document was created in part using AI and STT-dictation technology, any transcriptional errors that may result from this process are unintentional.  Patient: Beth Fox  Service: E/M   PCP: Judithann Novas, MD  DOB: 1951-08-27  DOS: 02/22/2024  Provider: Cephus Collin, MD  MRN: [id number]  Delivery: Face-to-face  Specialty: Interventional Pain Management  Type: Established Patient  Setting: Ambulatory outpatient facility  Specialty designation: 09  Referring Prov.: Judithann Novas, MD  Location: Outpatient office facility       HPI  [name], a 73 y.o. year old female, is here today because of her Lumbar facet arthropathy [M47.816]. [name] primary complain today is Back Pain (lower)  Pertinent problems: [name] does not have any pertinent problems on file. Pain Assessment: Severity of Chronic pain is reported as a 4 /10. Location: Back Left, Right/Denies. Onset: More than a month ago. Quality: [place], Aching. Timing: Constant. Modifying factor(s): Meds, laying, heating pad. Vitals:  height is 5\' 3"  (1.6 m) and weight is 155 lb (70.3 kg). Her temperature is 97.5 F (36.4 C) (abnormal). Her blood pressure is 127/78 and her pulse is 69. Her oxygen saturation is 97%.  BMI: Estimated body mass index is 27.46 kg/m as calculated from the following:   Height as of this encounter: 5\' 3"  (1.6 m).   Weight as of this encounter: 155 lb (70.3 kg). Last encounter: 01/10/2024. Last procedure: 01/31/2024.  Reason for encounter:   History of Present Illness   Beth Fox is a 73 year old female who presents for follow-up after a nerve block procedure for pain management.  She experienced significant relief from pain for about two weeks following the nerve  block procedure, but the relief has since worn off. This was the second time she underwent the nerve block, indicating a temporary benefit from the procedure.  She is not currently taking any blood thinners.       Post-procedure evaluation    Type: Lumbar Facet, Medial Branch Block(s) (w/ fluoroscopic mapping) #2  Laterality: Bilateral  Level: L3, L4, and L5 Medial Branch Level(s). Injecting these levels blocks the L3-4 and L4-5 lumbar facet joints. Imaging: Fluoroscopic guidance         Anesthesia: Local anesthesia (1-2% Lidocaine ) Sedation: Minimal Sedation                       DOS: 01/31/2024 Performed by: Cephus Collin, MD  Primary Purpose: Diagnostic/Therapeutic Indications: Low back pain severe enough to impact quality of life or function. 1. Degeneration of intervertebral disc of lumbar region with discogenic back pain   2. Lumbar facet arthropathy   3. Chronic pain syndrome    NAS-11 Pain score:   Pre-procedure: 9 /10   Post-procedure: 9 /10    Effectiveness:  Initial hour after procedure: 100 %  Subsequent 4-6 hours post-procedure: 100 %  Analgesia past initial 6 hours: 80 %  Ongoing improvement:  Analgesic:  80% for 2 weeks now pain is returning Function: Transient improvement ROM: [name] reports improvement in ROM   ROS  Constitutional: Denies any fever or chills Gastrointestinal: No reported hemesis, hematochezia, vomiting, or acute GI distress Musculoskeletal: low back pain Neurological: No reported episodes of acute onset apraxia, aphasia, dysarthria, agnosia, amnesia, paralysis, loss of coordination, or loss of consciousness  Medication Review  Vitamin D3, acetaminophen , amitriptyline , atorvastatin , gabapentin , hydrochlorothiazide , mometasone , omeprazole , sertraline , and traMADol  History Review  Allergy: [name] has no known allergies. Drug: [name]  reports no history of drug use. Alcohol:  reports no history of alcohol use. Tobacco:   reports that she has never smoked. She has never used smokeless tobacco. Social: [name]  reports that she has never smoked. She has never used smokeless tobacco. She reports that she does not drink alcohol and does not use drugs. 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 Surgical: [name]  has a past surgical history that includes Breast surgery; Tubal ligation; Appendectomy; Mastectomy (Right, Oct 1997); Reduction mammaplasty (Left, 2012); and Foot surgery (Left, 06/2018). Family: family history includes Arthritis in her sister and sister; Breast cancer in her cousin, cousin, maternal aunt, and paternal aunt; Cancer in her father; Diabetes in her father and mother; Heart attack in her paternal grandfather; Hypertension in her father, mother, sister, and sister; Parkinsonism in her father; Stroke in her maternal grandmother, mother, and paternal grandmother.  Laboratory Chemistry Profile   Renal Lab Results  Component Value Date   BUN 16 12/08/2023   CREATININE 1.05 12/08/2023   GFR 53.38 (L) 12/08/2023   GFRNONAA 77.07 08/27/2010    Hepatic Lab Results  Component Value Date   AST 20 12/08/2023   ALT 17 12/08/2023   ALBUMIN 4.4 12/08/2023   ALKPHOS 87 12/08/2023   HCVAB NEGATIVE 08/02/2015   LIPASE 26.0 02/09/2017    Electrolytes Lab Results  Component Value Date   NA 141 12/08/2023   K 4.3 12/08/2023   CL 100 12/08/2023   CALCIUM  9.7 12/08/2023    Bone Lab Results  Component Value Date   VD25OH 50.65 12/08/2023    Inflammation (CRP: Acute Phase) (ESR: Chronic Phase) Lab Results  Component Value Date   ESRSEDRATE 30 (H) 01/16/2014         Note: Above Lab results reviewed.   Disc levels: No significant findings above L2-3.   L2-3: No impingement.  Mild disc bulge.   L3-4: Suspected mild right subarticular lateral recess stenosis and borderline bilateral foraminal stenosis due to disc bulge,  right lateral recess disc protrusion, and facet arthropathy.   L4-5: Mild right foraminal stenosis due to disc osteophyte complex and facet arthropathy.   L5-S1: Borderline left foraminal stenosis due to facet and intervertebral spurring along with disc bulge.   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   IMPRESSION: 1. Lumbar spondylosis and degenerative disc disease causing mild impingement at L3-4 and L4-5 as noted above. 2. Aortic atherosclerosis. 3. Bilateral punctate nonobstructive nephrolithiasis. Note: Reviewed         Physical Exam  General appearance: Well nourished, well developed, and well hydrated. In no apparent acute distress Mental status: Alert, oriented x 3 (person, place, & time)       Respiratory: No evidence of acute respiratory distress Eyes: PERLA Vitals: BP 127/78   Pulse 69   Temp (!) 97.5 F (36.4 C)   Ht 5\' 3"  (1.6 m)   Wt 155 lb (70.3 kg)   SpO2 97%   BMI 27.46 kg/m  BMI: Estimated body mass index is 27.46 kg/m as calculated from the following:   Height as of this encounter: 5\' 3"  (1.6 m).   Weight as of this encounter: 155 lb (70.3 kg). Ideal: Ideal body weight: 52.4 kg (115 lb 8.3  oz) Adjusted ideal body weight: 59.6 kg (131 lb 5 oz)  + low back pain with increased pain facet loading  Assessment   Diagnosis Status  1. Lumbar facet arthropathy   2. Degeneration of intervertebral disc of lumbar region with discogenic back pain   3. Chronic pain syndrome    Responding Responding Controlled     Plan of Care  S/P 2 positive lumbar facet medial branch nerve blocks  Discussed lumbar RFA for purpose of getting longer term pain relief B/L L3,4,5 RFA with sedation [name] has a current medication list which includes the following long-term medication(s): amitriptyline , atorvastatin , gabapentin , hydrochlorothiazide , omeprazole , and sertraline .  Pharmacotherapy (Medications Ordered): No orders of the defined  types were placed in this encounter.  Orders:  Orders Placed This Encounter  Procedures   Radiofrequency,Lumbar    Standing Status:   Future    Expected Date:   03/07/2024    Expiration Date:   05/24/2024    Scheduling Instructions:     Side(s): Bilateral     Level(s): L3, L4, L5,  Medial Branch Nerve(s)     Sedation: With Sedation     Scheduling Timeframe: As soon as pre-approved    Where will this procedure be performed?:   [facility]   Follow-up plan:   Return in about 20 days (around 03/13/2024) for B/L L3, 4, 5 RFA, in clinic IV Versed .     Right SI joint, right piriformis, right L4 medial branch peripheral nerve stimulator 10/04/2023     Recent Visits Date Type Provider Dept  01/31/24 Procedure visit Cephus Collin, MD [facility]  01/10/24 Office Visit Cephus Collin, MD [facility]  12/07/23 Procedure visit Cephus Collin, MD [facility]  Showing recent visits within past 90 days and meeting all other requirements Today's Visits Date Type Provider Dept  02/22/24 Office Visit Cephus Collin, MD [facility]  Showing today's visits and meeting all other requirements Future Appointments No visits were found meeting these conditions. Showing future appointments within next 90 days and meeting all other requirements   I discussed the assessment and treatment plan with the patient. The patient was provided an opportunity to ask questions and all were answered. The patient agreed with the plan and demonstrated an understanding of the instructions.  Patient advised to call back or seek an in-person evaluation if the symptoms or condition worsens.  Duration of encounter: 20 minutes.  Total time on encounter, as per AMA guidelines included both the face-to-face and non-face-to-face time personally spent by the physician and/or other qualified health care professional(s) on the day of the encounter (includes time in activities that  require the physician or other qualified health care professional and does not include time in activities normally performed by clinical staff). Physician's time may include the following activities when performed: Preparing to see the patient (e.g., pre-charting review of records, searching for previously ordered imaging, lab work, and nerve conduction tests) Review of prior analgesic pharmacotherapies. Reviewing PMP Interpreting ordered tests (e.g., lab work, imaging, nerve conduction tests) Performing post-procedure evaluations, including interpretation of diagnostic procedures Obtaining and/or reviewing separately obtained history Performing a medically appropriate examination and/or evaluation Counseling and educating the patient/family/caregiver Ordering medications, tests, or procedures Referring and communicating with other health care professionals (when not separately reported) Documenting clinical information in the electronic or other health record Independently interpreting results (not separately reported) and communicating results to the patient/ family/caregiver Care coordination (not separately reported)  Note  by: Cephus Collin, MD (TTS and AI technology used. I apologize for any typographical errors that were not detected and corrected.) Date: 02/22/2024; Time: 9:25 AM

## 2024-02-28 ENCOUNTER — Telehealth: Payer: Self-pay | Admitting: *Deleted

## 2024-02-28 DIAGNOSIS — R7309 Other abnormal glucose: Secondary | ICD-10-CM

## 2024-02-28 DIAGNOSIS — E7800 Pure hypercholesterolemia, unspecified: Secondary | ICD-10-CM

## 2024-02-28 NOTE — Telephone Encounter (Signed)
-----   Message from Gerry Krone sent at 02/28/2024 11:16 AM EDT ----- Regarding: Lab orders for [facility], 6.5.25 Lab orders for a 3 month follow up appt.

## 2024-03-15 ENCOUNTER — Other Ambulatory Visit: Payer: Self-pay | Admitting: Family Medicine

## 2024-03-15 ENCOUNTER — Ambulatory Visit
Admission: RE | Admit: 2024-03-15 | Discharge: 2024-03-15 | Disposition: A | Discharge status: Home / Self Care | Source: Ambulatory Visit | Attending: Student in an Organized Health Care Education/Training Program | Admitting: Student in an Organized Health Care Education/Training Program

## 2024-03-15 ENCOUNTER — Ambulatory Visit
Attending: Student in an Organized Health Care Education/Training Program | Admitting: Student in an Organized Health Care Education/Training Program

## 2024-03-15 ENCOUNTER — Other Ambulatory Visit (INDEPENDENT_AMBULATORY_CARE_PROVIDER_SITE_OTHER)

## 2024-03-15 ENCOUNTER — Encounter: Payer: Self-pay | Admitting: Student in an Organized Health Care Education/Training Program

## 2024-03-15 DIAGNOSIS — M47816 Spondylosis without myelopathy or radiculopathy, lumbar region: Secondary | ICD-10-CM | POA: Diagnosis present

## 2024-03-15 DIAGNOSIS — G894 Chronic pain syndrome: Secondary | ICD-10-CM | POA: Insufficient documentation

## 2024-03-15 DIAGNOSIS — E7800 Pure hypercholesterolemia, unspecified: Secondary | ICD-10-CM | POA: Diagnosis not present

## 2024-03-15 LAB — LIPID PANEL
Cholesterol: 216 mg/dL — ABNORMAL HIGH (ref 0–200)
HDL: 43.5 mg/dL (ref >39.00)
LDL Cholesterol: 143 mg/dL — ABNORMAL HIGH (ref 0–99)
NonHDL: 172.77
Total CHOL/HDL Ratio: 5
Triglycerides: 150 mg/dL — ABNORMAL HIGH (ref 0.0–149.0)
VLDL: 30 mg/dL (ref 0.0–40.0)

## 2024-03-15 LAB — COMPREHENSIVE METABOLIC PANEL WITH GFR
ALT: 18 U/L (ref 0–35)
AST: 24 U/L (ref 0–37)
Albumin: 4.2 g/dL (ref 3.5–5.2)
Alkaline Phosphatase: 87 U/L (ref 39–117)
BUN: 16 mg/dL (ref 6–23)
CO2: 32 meq/L (ref 19–32)
Calcium: 9.7 mg/dL (ref 8.4–10.5)
Chloride: 99 meq/L (ref 96–112)
Creatinine, Ser: 0.99 mg/dL (ref 0.40–1.20)
GFR: 57.18 mL/min — ABNORMAL LOW (ref >60.00)
Glucose, Bld: 172 mg/dL — ABNORMAL HIGH (ref 70–99)
Potassium: 3.7 meq/L (ref 3.5–5.1)
Sodium: 139 meq/L (ref 135–145)
Total Bilirubin: 0.6 mg/dL (ref 0.2–1.2)
Total Protein: 7 g/dL (ref 6.0–8.3)

## 2024-03-15 MED ORDER — LIDOCAINE HCL 2 % IJ SOLN
20.0000 mL | Freq: Once | INTRAMUSCULAR | Status: AC
  Administered 2024-03-15: 400 mg

## 2024-03-15 MED ORDER — ROPIVACAINE HCL 2 MG/ML IJ SOLN
INTRAMUSCULAR | Status: AC
Start: 2024-03-15 — End: ?
  Filled 2024-03-15: qty 20

## 2024-03-15 MED ORDER — LACTATED RINGERS IV SOLN: Freq: Once | INTRAVENOUS | Status: AC

## 2024-03-15 MED ORDER — LIDOCAINE HCL 2 % IJ SOLN
INTRAMUSCULAR | Status: AC
Start: 2024-03-15 — End: ?
  Filled 2024-03-15: qty 20

## 2024-03-15 MED ORDER — DEXAMETHASONE SODIUM PHOSPHATE 10 MG/ML IJ SOLN
INTRAMUSCULAR | Status: AC
  Filled 2024-03-15: qty 2

## 2024-03-15 MED ORDER — MIDAZOLAM HCL 2 MG/2ML IJ SOLN
0.5000 mg | Freq: Once | INTRAMUSCULAR | Status: AC
  Administered 2024-03-15: 2 mg via INTRAVENOUS

## 2024-03-15 MED ORDER — MIDAZOLAM HCL 2 MG/2ML IJ SOLN
INTRAMUSCULAR | Status: AC
Start: 2024-03-15 — End: ?
  Filled 2024-03-15: qty 2

## 2024-03-15 MED ORDER — DEXAMETHASONE SODIUM PHOSPHATE 10 MG/ML IJ SOLN
20.0000 mg | Freq: Once | INTRAMUSCULAR | Status: AC
  Administered 2024-03-15: 20 mg

## 2024-03-15 MED ORDER — ROPIVACAINE HCL 2 MG/ML IJ SOLN
18.0000 mL | Freq: Once | INTRAMUSCULAR | Status: AC
  Administered 2024-03-15: 18 mL via PERINEURAL

## 2024-03-15 NOTE — Progress Notes (Signed)
 PROVIDER NOTE: Interpretation of information contained herein should be left to medically-trained personnel. Specific patient instructions are provided elsewhere under "Patient Instructions" section of medical record. This document was created in part using STT-dictation technology, any transcriptional errors that may result from this process are unintentional.  Patient: Beth Fox Type: Established DOB: [birth year] MRN: [id number] PCP: Judithann Novas, MD  Service: Procedure DOS: 03/15/2024 Setting: Ambulatory Location: Ambulatory outpatient facility Delivery: Face-to-face Provider: Cephus Collin, MD Specialty: Interventional Pain Management Specialty designation: 09 Location: Outpatient facility Ref. Prov.: Judithann Novas, MD       Interventional Therapy   Procedure: Lumbar Facet, Medial Branch Radiofrequency Ablation (RFA) #1  Laterality: Bilateral (-50)  Level: L3, L4, and L5 Medial Branch Level(s). These levels will denervate the L3-4 and L4-5 lumbar facet joints.  Imaging: Fluoroscopy-guided         Anesthesia: Local anesthesia (1-2% Lidocaine ) Sedation: Minimal Sedation                       DOS: 03/15/2024  Performed by: Cephus Collin, MD  Purpose: Therapeutic/Palliative Indications: Low back pain severe enough to impact quality of life or function. Indications: 1. Lumbar facet arthropathy   2. Chronic pain syndrome    Beth Fox has been dealing with the above chronic pain for longer than three months and has either failed to respond, was unable to tolerate, or simply did not get enough benefit from other more conservative therapies including, but not limited to: 1. Over-the-counter medications 2. Anti-inflammatory medications 3. Muscle relaxants 4. Membrane stabilizers 5. Opioids 6. Physical therapy and/or chiropractic manipulation 7. Modalities (Heat, ice, etc.) 8. Invasive techniques such as nerve blocks. [name] has attained more than 50% relief of the pain  from a series of diagnostic injections conducted in separate occasions.  Pain Score: Pre-procedure: 10-Worst pain ever/10 Post-procedure: 4 /10     Position / Prep / Materials:  Position: Prone  Prep solution: ChloraPrep (2% chlorhexidine gluconate and 70% isopropyl alcohol) Prep Area: Entire Lumbosacral Region (Lower back from mid-thoracic region to end of tailbone and from flank to flank.) Materials:  Tray: RFA (Radiofrequency) tray Needle(s):  Type: RFA (Teflon-coated radiofrequency ablation needles) Gauge (G): 22  Length: Regular (10cm) Qty: 3     H&P (Pre-op Assessment):  Beth Fox is a 73 y.o. (year old), female patient, seen today for interventional treatment. She  has a past surgical history that includes Breast surgery; Tubal ligation; Appendectomy; Mastectomy (Right, Oct 1997); Reduction mammaplasty (Left, 2012); and Foot surgery (Left, 06/2018). Beth Fox has a current medication list which includes the following prescription(s): acetaminophen , amitriptyline , vitamin d3, gabapentin , hydrochlorothiazide , mometasone , omeprazole , sertraline , tramadol, and atorvastatin , and the following Facility-Administered Medications: lactated ringers . Her primarily concern today is the Back Pain (lower)  Initial Vital Signs:  Pulse/HCG Rate: 95ECG Heart Rate: 90 Temp: (!) 96.9 F (36.1 C) Resp: 14 BP: 137/87 SpO2: 97 %  BMI: Estimated body mass index is 27.81 kg/m as calculated from the following:   Height as of this encounter: 5\' 3"  (1.6 m).   Weight as of this encounter: 157 lb (71.2 kg).  Risk Assessment: Allergies: Reviewed. She has no known allergies.  Allergy Precautions: None required Coagulopathies: Reviewed. None identified.  Blood-thinner therapy: None at this time Active Infection(s): Reviewed. None identified. Beth Fox is afebrile  Site Confirmation: Beth Fox was asked to confirm the procedure and laterality before marking the site Procedure checklist:  Completed Consent: Before the procedure and under  the influence of no sedative(s), amnesic(s), or anxiolytics, the patient was informed of the treatment options, risks and possible complications. To fulfill our ethical and legal obligations, as recommended by the American Medical Association's Code of Ethics, I have informed the patient of my clinical impression; the nature and purpose of the treatment or procedure; the risks, benefits, and possible complications of the intervention; the alternatives, including doing nothing; the risk(s) and benefit(s) of the alternative treatment(s) or procedure(s); and the risk(s) and benefit(s) of doing nothing. The patient was provided information about the general risks and possible complications associated with the procedure. These may include, but are not limited to: failure to achieve desired goals, infection, bleeding, organ or nerve damage, allergic reactions, paralysis, and death. In addition, the patient was informed of those risks and complications associated to Spine-related procedures, such as failure to decrease pain; infection (i.e.: Meningitis, epidural or intraspinal abscess); bleeding (i.e.: epidural hematoma, subarachnoid hemorrhage, or any other type of intraspinal or peri-dural bleeding); organ or nerve damage (i.e.: Any type of peripheral nerve, nerve root, or spinal cord injury) with subsequent damage to sensory, motor, and/or autonomic systems, resulting in permanent pain, numbness, and/or weakness of one or several areas of the body; allergic reactions; (i.e.: anaphylactic reaction); and/or death. Furthermore, the patient was informed of those risks and complications associated with the medications. These include, but are not limited to: allergic reactions (i.e.: anaphylactic or anaphylactoid reaction(s)); adrenal axis suppression; blood sugar elevation that in diabetics may result in ketoacidosis or comma; water retention that in patients with history  of congestive heart failure may result in shortness of breath, pulmonary edema, and decompensation with resultant heart failure; weight gain; swelling or edema; medication-induced neural toxicity; particulate matter embolism and blood vessel occlusion with resultant organ, and/or nervous system infarction; and/or aseptic necrosis of one or more joints. Finally, the patient was informed that Medicine is not an exact science; therefore, there is also the possibility of unforeseen or unpredictable risks and/or possible complications that may result in a catastrophic outcome. The patient indicated having understood very clearly. We have given the patient no guarantees and we have made no promises. Enough time was given to the patient to ask questions, all of which were answered to the patient's satisfaction. Ms. Kem has indicated that she wanted to continue with the procedure. Attestation: I, the ordering provider, attest that I have discussed with the patient the benefits, risks, side-effects, alternatives, likelihood of achieving goals, and potential problems during recovery for the procedure that I have provided informed consent. Date  Time: 03/15/2024  7:51 AM  Pre-Procedure Preparation:  Monitoring: As per clinic protocol. Respiration, ETCO2, SpO2, BP, heart rate and rhythm monitor placed and checked for adequate function Safety Precautions: Patient was assessed for positional comfort and pressure points before starting the procedure. Time-out: I initiated and conducted the "Time-out" before starting the procedure, as per protocol. The patient was asked to participate by confirming the accuracy of the "Time Out" information. Verification of the correct person, site, and procedure were performed and confirmed by me, the nursing staff, and the patient. "Time-out" conducted as per Joint Commission's Universal Protocol (UP.01.01.01). Time: 0828 Start Time: 0828 hrs.  Description of Procedure:           Laterality: See above. Levels:  See above. Safety Precautions: Aspiration looking for blood return was conducted prior to all injections. At no point did we inject any substances, as a needle was being advanced. Before injecting, the patient was told  to immediately notify me if she was experiencing any new onset of "ringing in the ears, or metallic taste in the mouth". No attempts were made at seeking any paresthesias. Safe injection practices and needle disposal techniques used. Medications properly checked for expiration dates. SDV (single dose vial) medications used. After the completion of the procedure, all disposable equipment used was discarded in the proper designated medical waste containers. Local Anesthesia: Protocol guidelines were followed. The patient was positioned over the fluoroscopy table. The area was prepped in the usual manner. The time-out was completed. The target area was identified using fluoroscopy. A 12-in long, straight, sterile hemostat was used with fluoroscopic guidance to locate the targets for each level blocked. Once located, the skin was marked with an approved surgical skin marker. Once all sites were marked, the skin (epidermis, dermis, and hypodermis), as well as deeper tissues (fat, connective tissue and muscle) were infiltrated with a small amount of a short-acting local anesthetic, loaded on a 10cc syringe with a 25G, 1.5-in  Needle. An appropriate amount of time was allowed for local anesthetics to take effect before proceeding to the next step. Technical description of process:  Radiofrequency Ablation (RFA)  L3 Medial Branch Nerve RFA: The target area for the L3 medial branch is at the junction of the postero-lateral aspect of the superior articular process and the superior, posterior, and medial edge of the transverse process of L4. Under fluoroscopic guidance, a Radiofrequency needle was inserted until contact was made with os over the superior postero-lateral  aspect of the pedicular shadow (target area). Sensory and motor testing was conducted to properly adjust the position of the needle. Once satisfactory placement of the needle was achieved, the numbing solution was slowly injected after negative aspiration for blood. 2.0 mL of the nerve block solution was injected without difficulty or complication. After waiting for at least 3 minutes, the ablation was performed. Once completed, the needle was removed intact. L4 Medial Branch Nerve RFA: The target area for the L4 medial branch is at the junction of the postero-lateral aspect of the superior articular process and the superior, posterior, and medial edge of the transverse process of L5. Under fluoroscopic guidance, a Radiofrequency needle was inserted until contact was made with os over the superior postero-lateral aspect of the pedicular shadow (target area). Sensory and motor testing was conducted to properly adjust the position of the needle. Once satisfactory placement of the needle was achieved, the numbing solution was slowly injected after negative aspiration for blood. 2.0 mL of the nerve block solution was injected without difficulty or complication. After waiting for at least 3 minutes, the ablation was performed. Once completed, the needle was removed intact. L5 Medial Branch Nerve RFA: The target area for the L5 medial branch is at the junction of the postero-lateral aspect of the superior articular process of S1 and the superior, posterior, and medial edge of the sacral ala. Under fluoroscopic guidance, a Radiofrequency needle was inserted until contact was made with os over the superior postero-lateral aspect of the pedicular shadow (target area). Sensory and motor testing was conducted to properly adjust the position of the needle. Once satisfactory placement of the needle was achieved, the numbing solution was slowly injected after negative aspiration for blood. 2.0 mL of the nerve block solution was  injected without difficulty or complication. After waiting for at least 3 minutes, the ablation was performed. Once completed, the needle was removed intact.  Radiofrequency lesioning (ablation):  Radiofrequency Generator: [employer] AccurianTM [employer]  1000 RF Generator Sensory Stimulation Parameters: 50 Hz was used to locate & identify the nerve, making sure that the needle was positioned such that there was no sensory stimulation below 0.3 V or above 0.7 V. Motor Stimulation Parameters: 2 Hz was used to evaluate the motor component. Care was taken not to lesion any nerves that demonstrated motor stimulation of the lower extremities at an output of less than 2.5 times that of the sensory threshold, or a maximum of 2.0 V. Lesioning Technique Parameters: Standard Radiofrequency settings. (Not bipolar or pulsed.) Temperature Settings: 80 degrees C Lesioning time: 60 seconds Stationary intra-operative compliance: Compliant  Once the entire procedure was completed, the treated area was cleaned, making sure to leave some of the prepping solution back to take advantage of its long term bactericidal properties.    Illustration of the posterior view of the lumbar spine and the posterior neural structures. Laminae of L2 through S1 are labeled. DPRL5, dorsal primary ramus of L5; DPRS1, dorsal primary ramus of S1; DPR3, dorsal primary ramus of L3; FJ, facet (zygapophyseal) joint L3-L4; I, inferior articular process of L4; LB1, lateral branch of dorsal primary ramus of L1; IAB, inferior articular branches from L3 medial branch (supplies L4-L5 facet joint); IBP, intermediate branch plexus; MB3, medial branch of dorsal primary ramus of L3; NR3, third lumbar nerve root; S, superior articular process of L5; SAB, superior articular branches from L4 (supplies L4-5 facet joint also); TP3, transverse process of L3.  Facet Joint Innervation (* possible contribution)  L1-2 T12, L1 (L2*)  Medial Branch  L2-3 L1, L2 (L3*)          "          "  L3-4 L2, L3 (L4*)         "          "  L4-5 L3, L4 (L5*)         "          "  L5-S1 L4, L5, S1          "          "    Vitals:   03/15/24 0842 03/15/24 0847 03/15/24 0851 03/15/24 0857  BP: 116/88 129/88 135/81 122/89  Pulse:      Resp: 17 18 16 16   Temp:      TempSrc:      SpO2: 93% 92% 93% 95%  Weight:      Height:        Start Time: 0828 hrs. End Time: 0848 hrs.  Imaging Guidance (Spinal):         Type of Imaging Technique: Fluoroscopy Guidance (Spinal) Indication(s): Fluoroscopy guidance for needle placement to enhance accuracy in procedures requiring precise needle localization for targeted delivery of medication in or near specific anatomical locations not easily accessible without such real-time imaging assistance. Exposure Time: Please see nurses notes. Contrast: None used. Fluoroscopic Guidance: I was personally present during the use of fluoroscopy. "Tunnel Vision Technique" used to obtain the best possible view of the target area. Parallax error corrected before commencing the procedure. "Direction-depth-direction" technique used to introduce the needle under continuous pulsed fluoroscopy. Once target was reached, antero-posterior, oblique, and lateral fluoroscopic projection used confirm needle placement in all planes. Images permanently stored in EMR. Interpretation: No contrast injected. I personally interpreted the imaging intraoperatively. Adequate needle placement confirmed in multiple planes. Permanent images saved into the patient's record.  Antibiotic Prophylaxis:   Anti-infectives (From admission, onward)  None      Indication(s): None identified   Post-operative Assessment:  Post-procedure Vital Signs:  Pulse/HCG Rate: 9579 Temp: (!) 96.9 F (36.1 C) Resp: 16 BP: 122/89 SpO2: 95 %  EBL: None  Complications: No immediate post-treatment complications observed by team, or reported by patient.  Note: The patient tolerated the  entire procedure well. A repeat set of vitals were taken after the procedure and the patient was kept under observation following institutional policy, for this type of procedure. Post-procedural neurological assessment was performed, showing return to baseline, prior to discharge. The patient was provided with post-procedure discharge instructions, including a section on how to identify potential problems. Should any problems arise concerning this procedure, the patient was given instructions to immediately contact us , at any time, without hesitation. In any case, we plan to contact the patient by telephone for a follow-up status report regarding this interventional procedure.  Comments:  No additional relevant information.  Plan of Care (POC)  Orders:  Orders Placed This Encounter  Procedures   DG PAIN [facility] C-ARM 1-60 MIN NO REPORT    Intraoperative interpretation by procedural physician at [facility].    Standing Status:   Standing    Number of Occurrences:   1    Reason for exam::   Assistance in needle guidance and placement for procedures requiring needle placement in or near specific anatomical locations not easily accessible without such assistance.    Medications ordered for procedure: Meds ordered this encounter  Medications   lidocaine  (XYLOCAINE ) 2 % (with pres) injection 400 mg   lactated ringers  infusion   midazolam  (VERSED ) injection 0.5-2 mg    Make sure Flumazenil is available in the pyxis when using this medication. If oversedation occurs, administer 0.2 mg IV over 15 sec. If after 45 sec no response, administer 0.2 mg again over 1 min; may repeat at 1 min intervals; not to exceed 4 doses (1 mg)   ropivacaine  (PF) 2 mg/mL (0.2%) (NAROPIN ) injection 18 mL   dexamethasone  (DECADRON ) injection 20 mg   Medications administered: We administered lidocaine , lactated ringers , midazolam , ropivacaine  (PF) 2 mg/mL (0.2%), and dexamethasone .  See the medical record for  exact dosing, route, and time of administration.   Follow-up plan:   Return in about 8 weeks (around 05/10/2024) for PPE, F2F.     Recent Visits Date Type Provider Dept  02/22/24 Office Visit Cephus Collin, MD [facility]  01/31/24 Procedure visit Cephus Collin, MD [facility]  01/10/24 Office Visit Cephus Collin, MD [facility]  Showing recent visits within past 90 days and meeting all other requirements Today's Visits Date Type Provider Dept  03/15/24 Procedure visit Cephus Collin, MD [facility]  Showing today's visits and meeting all other requirements Future Appointments Date Type Provider Dept  05/09/24 Appointment Cephus Collin, MD [facility]  Showing future appointments within next 90 days and meeting all other requirements   Disposition: Discharge home  Discharge (Date  Time): 03/15/2024; 0900 hrs.   Primary Care Physician: Judithann Novas, MD Location: [facility] Note by: Cephus Collin, MD (TTS technology used. I apologize for any typographical errors that were not detected and corrected.) Date: 03/15/2024; Time: 9:29 AM  Disclaimer:  Medicine is not an Visual merchandiser. The only guarantee in medicine is that nothing is guaranteed. It is important to note that the decision to proceed with this intervention was based on the information collected from the patient. The Data  and conclusions were drawn from the patient's questionnaire, the interview, and the physical examination. Because the information was provided in large part by the patient, it cannot be guaranteed that it has not been purposely or unconsciously manipulated. Every effort has been made to obtain as much relevant data as possible for this evaluation. It is important to note that the conclusions that lead to this procedure are derived in large part from the available data. Always take into account that the treatment will also be dependent on  availability of resources and existing treatment guidelines, considered by other Pain Management Practitioners as being common knowledge and practice, at the time of the intervention. For Medico-Legal purposes, it is also important to point out that variation in procedural techniques and pharmacological choices are the acceptable norm. The indications, contraindications, technique, and results of the above procedure should only be interpreted and judged by a Board-Certified Interventional Pain Specialist with extensive familiarity and expertise in the same exact procedure and technique.

## 2024-03-15 NOTE — Patient Instructions (Addendum)
 ______________________________________________________________________    Post-Procedure Discharge Instructions  Instructions: Apply ice:  Purpose: This will minimize any swelling and discomfort after procedure.  When: Day of procedure, as soon as you get home. How: Fill a plastic sandwich bag with crushed ice. Cover it with a small towel and apply to injection site. How long: (15 min on, 15 min off) Apply for 15 minutes then remove x 15 minutes.  Repeat sequence on day of procedure, until you go to bed. Apply heat:  Purpose: To treat any soreness and discomfort from the procedure. When: Starting the next day after the procedure. How: Apply heat to procedure site starting the day following the procedure. How long: May continue to repeat daily, until discomfort goes away. Food intake: Start with clear liquids (like water) and advance to regular food, as tolerated.  Physical activities: Keep activities to a minimum for the first 8 hours after the procedure. After that, then as tolerated. Driving: If you have received any sedation, be responsible and do not drive. You are not allowed to drive for 24 hours after having sedation. Blood thinner: (Applies only to those taking blood thinners) You may restart your blood thinner 6 hours after your procedure. Insulin: (Applies only to Diabetic patients taking insulin) As soon as you can eat, you may resume your normal dosing schedule. Infection prevention: Keep procedure site clean and dry. Shower daily and clean area with soap and water. Post-procedure Pain Diary: Extremely important that this be done correctly and accurately. Recorded information will be used to determine the next step in treatment. For the purpose of accuracy, follow these rules: Evaluate only the area treated. Do not report or include pain from an untreated area. For the purpose of this evaluation, ignore all other areas of pain, except for the treated area. After your procedure,  avoid taking a long nap and attempting to complete the pain diary after you wake up. Instead, set your alarm clock to go off every hour, on the hour, for the initial 8 hours after the procedure. Document the duration of the numbing medicine, and the relief you are getting from it. Do not go to sleep and attempt to complete it later. It will not be accurate. If you received sedation, it is likely that you were given a medication that may cause amnesia. Because of this, completing the diary at a later time may cause the information to be inaccurate. This information is needed to plan your care. Follow-up appointment: Keep your post-procedure follow-up evaluation appointment after the procedure (usually 2 weeks for most procedures, 6 weeks for radiofrequencies). DO NOT FORGET to bring you pain diary with you.   Expect: (What should I expect to see with my procedure?) From numbing medicine (AKA: Local Anesthetics): Numbness or decrease in pain. You may also experience some weakness, which if present, could last for the duration of the local anesthetic. Onset: Full effect within 15 minutes of injected. Duration: It will depend on the type of local anesthetic used. On the average, 1 to 8 hours.  From steroids (Applies only if steroids were used): Decrease in swelling or inflammation. Once inflammation is improved, relief of the pain will follow. Onset of benefits: Depends on the amount of swelling present. The more swelling, the longer it will take for the benefits to be seen. In some cases, up to 10 days. Duration: Steroids will stay in the system x 2 weeks. Duration of benefits will depend on multiple posibilities including persistent irritating factors. Side-effects: If  present, they may typically last 2 weeks (the duration of the steroids). Frequent: Cramps (if they occur, drink Gatorade and take over-the-counter Magnesium 450-500 mg once to twice a day); water retention with temporary weight gain;  increases in blood sugar; decreased immune system response; increased appetite. Occasional: Facial flushing (red, warm cheeks); mood swings; menstrual changes. Uncommon: Long-term decrease or suppression of natural hormones; bone thinning. (These are more common with higher doses or more frequent use. This is why we prefer that our patients avoid having any injection therapies in other practices.)  Very Rare: Severe mood changes; psychosis; aseptic necrosis. From procedure: Some discomfort is to be expected once the numbing medicine wears off. This should be minimal if ice and heat are applied as instructed.  Call if: (When should I call?) You experience numbness and weakness that gets worse with time, as opposed to wearing off. New onset bowel or bladder incontinence. (Applies only to procedures done in the spine)  Emergency Numbers: Durning business hours (Monday - Thursday, 8:00 AM - 4:00 PM) (Friday, 9:00 AM - 12:00 Noon): [phone] After hours: [phone] NOTE: If you are having a problem and are unable connect with, or to talk to a provider, then go to your nearest urgent care or emergency department. If the problem is serious and urgent, please call 911. ______________________________________________________________________      Pain Clinic Pain Levels (0-5/10)  Pain Level Score  Description  No Pain 0   Mild pain 1 Nagging, annoying, but does not interfere with basic activities of daily living (ADL). Patients are able to eat, bathe, get dressed, toileting (being able to get on and off the toilet and perform personal hygiene functions), transfer (move in and out of bed or a chair without assistance), and maintain continence (able to control bladder and bowel functions). Blood pressure and heart rate are unaffected. A normal heart rate for a healthy adult ranges from 60 to 100 bpm (beats per minute).   Mild to moderate pain 2 Noticeable and distracting. Impossible to hide from  other people. More frequent flare-ups. Still possible to adapt and function close to normal. It can be very annoying and may have occasional stronger flare-ups. With discipline, patients may get used to it and adapt.   Moderate pain 3 Interferes significantly with activities of daily living (ADL). It becomes difficult to feed, bathe, get dressed, get on and off the toilet or to perform personal hygiene functions. Difficult to get in and out of bed or a chair without assistance. Very distracting. With effort, it can be ignored when deeply involved in activities.   Moderately severe pain 4 Impossible to ignore for more than a few minutes. With effort, patients may still be able to manage work or participate in some social activities. Very difficult to concentrate. Signs of autonomic nervous system discharge are evident: dilated pupils (mydriasis); mild sweating (diaphoresis); sleep interference. Heart rate becomes elevated (>115 bpm). Diastolic blood pressure (lower number) rises above 100 mmHg. Patients find relief in laying down and not moving.   Severe pain 5 Intense and extremely unpleasant. Associated with frowning face and frequent crying. Pain overwhelms the senses.  Ability to do any activity or maintain social relationships becomes significantly limited. Conversation becomes difficult. Pacing back and forth is common, as getting into a comfortable position is nearly impossible. Pain wakes you up from deep sleep. Physical signs will be obvious: pupillary dilation; increased sweating; goosebumps; brisk reflexes; cold, clammy hands and feet; nausea, vomiting or dry  heaves; loss of appetite; significant sleep disturbance with inability to fall asleep or to remain asleep. When persistent, significant weight loss is observed due to the complete loss of appetite and sleep deprivation.  Blood pressure and heart rate becomes significantly elevated. Caution: If elevated blood pressure triggers a pounding  headache associated with blurred vision, then the patient should immediately seek attention at an urgent or emergency care unit, as these may be signs of an impending stroke.    Emergency Department Pain Levels (6-10/10)  Emergency Room Pain 6 Severely limiting. Requires emergency care and should not be seen or managed at an outpatient pain management facility. Communication becomes difficult and requires great effort. Assistance to reach the emergency department may be required. Facial flushing and profuse sweating along with potentially dangerous increases in heart rate and blood pressure will be evident.   Distressing pain 7 Self-care is very difficult. Assistance is required to transport, or use restroom. Assistance to reach the emergency department will be required. Tasks requiring coordination, such as bathing and getting dressed become very difficult.   Disabling pain 8 Self-care is no longer possible. At this level, pain is disabling. The individual is unable to do even the most "basic" activities such as walking, eating, bathing, dressing, transferring to a bed, or toileting. Fine motor skills are lost. It is difficult to think clearly.   Incapacitating pain 9 Pain becomes incapacitating. Thought processing is no longer possible. Difficult to remember your own name. Control of movement and coordination are lost.   The worst pain imaginable 10 At this level, most patients pass out from pain. When this level is reached, collapse of the autonomic nervous system occurs, leading to a sudden drop in blood pressure and heart rate. This in turn results in a temporary and dramatic drop in blood flow to the brain, leading to a loss of consciousness. Fainting is one of the body's self defense mechanisms. Passing out puts the brain in a calmed state and causes it to shut down for a while, in order to begin the healing process.    Summary: 1.   Refer to this scale when providing us  with your pain  level. 2.   Be accurate and careful when reporting your pain level. This will help with your care. 3.   Over-reporting your pain level will lead to loss of credibility. 4.   Even a level of 1/10 means that there is pain and will be treated at our facility. 5.   High, inaccurate reporting will be documented as "Symptom Exaggeration", leading to loss of credibility and suspicions of possible secondary gains such as obtaining more narcotics, or wanting to appear disabled, for fraudulent reasons. 6.   Only pain levels of 5 or below will be seen at our facility. 7.   Pain levels of 6 and above will be sent to the Emergency Department and the appointment cancelled.  ______________________________________________________________________________________________

## 2024-03-15 NOTE — Telephone Encounter (Signed)
 Pharmacy is asking for 90 day supply.  Please sign order below if appropriate.

## 2024-03-16 ENCOUNTER — Telehealth: Payer: Self-pay | Admitting: *Deleted

## 2024-03-16 ENCOUNTER — Other Ambulatory Visit

## 2024-03-16 ENCOUNTER — Ambulatory Visit: Payer: Self-pay | Admitting: Family Medicine

## 2024-03-16 NOTE — Telephone Encounter (Signed)
 Post procedure call; voicemail left

## 2024-04-10 ENCOUNTER — Other Ambulatory Visit: Payer: Self-pay | Admitting: Family Medicine

## 2024-05-09 ENCOUNTER — Encounter: Payer: Self-pay | Admitting: Student in an Organized Health Care Education/Training Program

## 2024-05-09 ENCOUNTER — Ambulatory Visit
Attending: Student in an Organized Health Care Education/Training Program | Admitting: Student in an Organized Health Care Education/Training Program

## 2024-05-09 VITALS — BP 136/84 | HR 86 | Temp 97.3°F | Ht 63.0 in | Wt 157.0 lb

## 2024-05-09 DIAGNOSIS — M47816 Spondylosis without myelopathy or radiculopathy, lumbar region: Secondary | ICD-10-CM | POA: Insufficient documentation

## 2024-05-09 DIAGNOSIS — M5136 Other intervertebral disc degeneration, lumbar region with discogenic back pain only: Secondary | ICD-10-CM | POA: Diagnosis present

## 2024-05-09 DIAGNOSIS — G894 Chronic pain syndrome: Secondary | ICD-10-CM | POA: Diagnosis present

## 2024-05-09 NOTE — Progress Notes (Signed)
 PROVIDER NOTE: Interpretation of information contained herein should be left to medically-trained personnel. Specific patient instructions are provided elsewhere under Patient Instructions section of medical record. This document was created in part using AI and STT-dictation technology, any transcriptional errors that may result from this process are unintentional.  Patient: Beth Fox  Service: E/M   PCP: Beth Greig BRAVO, MD  DOB: [birth year]  DOS: 05/09/2024  Provider: Wallie Sherry, MD  MRN: [id number]  Delivery: Face-to-face  Specialty: Interventional Pain Management  Type: Established Patient  Setting: Ambulatory outpatient facility  Specialty designation: 09  Referring Prov.: Beth Greig BRAVO, MD  Location: Outpatient office facility       History of present illness (HPI) [name], a 73 y.o. year old female, is here today because of her Lumbar facet arthropathy [M47.816]. [name] primary complain today is Back Pain   Pain Assessment: Severity of Chronic pain is reported as a 0-No pain/10. Location: Back  /Denies. Onset: More than a month ago. Quality:  . Timing: Constant. Modifying factor(s): Denies. Vitals:  height is 5' 3 (1.6 m) and weight is 157 lb (71.2 kg). Her temperature is 97.3 F (36.3 C) (abnormal). Her blood pressure is 136/84 and her pulse is 86. Her oxygen saturation is 95%.  BMI: Estimated body mass index is 27.81 kg/m as calculated from the following:   Height as of this encounter: 5' 3 (1.6 m).   Weight as of this encounter: 157 lb (71.2 kg).  Last encounter: 02/22/2024. Last procedure: 03/15/2024.  Reason for encounter:   Post-Procedure Evaluation   Procedure: Lumbar Facet, Medial Branch Radiofrequency Ablation (RFA) #1  Laterality: Bilateral (-50)  Level: L3, L4, and L5 Medial Branch Level(s). These levels will denervate the L3-4 and L4-5 lumbar facet joints.  Imaging: Fluoroscopy-guided         Anesthesia: Local anesthesia (1-2%  Lidocaine ) Sedation: Minimal Sedation                       DOS: 03/15/2024  Performed by: Wallie Sherry, MD  Purpose: Therapeutic/Palliative Indications: Low back pain severe enough to impact quality of life or function. Indications: 1. Lumbar facet arthropathy   2. Chronic pain syndrome    [name] has been dealing with the above chronic pain for longer than three months and has either failed to respond, was unable to tolerate, or simply did not get enough benefit from other more conservative therapies including, but not limited to: 1. Over-the-counter medications 2. Anti-inflammatory medications 3. Muscle relaxants 4. Membrane stabilizers 5. Opioids 6. Physical therapy and/or chiropractic manipulation 7. Modalities (Heat, ice, etc.) 8. Invasive techniques such as nerve blocks. [name] has attained more than 50% relief of the pain from a series of diagnostic injections conducted in separate occasions.  Pain Score: Pre-procedure: 10-Worst pain ever/10 Post-procedure: 4 /10    Effectiveness:  Initial hour after procedure: 100 %  Subsequent 4-6 hours post-procedure: 100 %  Analgesia past initial 6 hours: 100 %  Ongoing improvement:  Analgesic:  100% Function: Somewhat improved ROM: [name] reports improvement in ROM  No results found for: [id number]  ROS  Constitutional: Denies any fever or chills Gastrointestinal: No reported hemesis, hematochezia, vomiting, or acute GI distress Musculoskeletal: Denies any acute onset joint swelling, redness, loss of ROM, or weakness Neurological: No reported episodes of acute onset apraxia, aphasia, dysarthria, agnosia, amnesia, paralysis, loss of coordination, or loss of consciousness  Medication Review  Vitamin D3, acetaminophen , amitriptyline , atorvastatin ,  gabapentin , hydrochlorothiazide , mometasone , omeprazole , sertraline , and traMADol  History Review  Allergy: [name] has no known allergies. Drug: [name]   reports no history of drug use. Alcohol:  reports no history of alcohol use. Tobacco:  reports that she has never smoked. She has never used smokeless tobacco. Social: [name]  reports that she has never smoked. She has never used smokeless tobacco. She reports that she does not drink alcohol and does not use drugs. 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 Surgical: [name]  has a past surgical history that includes Breast surgery; Tubal ligation; Appendectomy; Mastectomy (Right, Oct 1997); Reduction mammaplasty (Left, 2012); and Foot surgery (Left, 06/2018). Family: family history includes Arthritis in her sister and sister; Breast cancer in her cousin, cousin, maternal aunt, and paternal aunt; Cancer in her father; Diabetes in her father and mother; Heart attack in her paternal grandfather; Hypertension in her father, mother, sister, and sister; Parkinsonism in her father; Stroke in her maternal grandmother, mother, and paternal grandmother.  Laboratory Chemistry Profile   Renal Lab Results  Component Value Date   BUN 16 03/15/2024   CREATININE 0.99 03/15/2024   GFR 57.18 (L) 03/15/2024   GFRNONAA 77.07 08/27/2010    Hepatic Lab Results  Component Value Date   AST 24 03/15/2024   ALT 18 03/15/2024   ALBUMIN 4.2 03/15/2024   ALKPHOS 87 03/15/2024   HCVAB NEGATIVE 08/02/2015   LIPASE 26.0 02/09/2017    Electrolytes Lab Results  Component Value Date   NA 139 03/15/2024   K 3.7 03/15/2024   CL 99 03/15/2024   CALCIUM  9.7 03/15/2024    Bone Lab Results  Component Value Date   VD25OH 50.65 12/08/2023    Inflammation (CRP: Acute Phase) (ESR: Chronic Phase) Lab Results  Component Value Date   ESRSEDRATE 30 (H) 01/16/2014         Note: Above Lab results reviewed.  Recent Imaging Review  DG PAIN CLINIC C-ARM 1-60 MIN NO REPORT Fluoro was used, but no Radiologist interpretation will be provided.  Please  refer to NOTES tab for provider progress note. Note: Reviewed        Physical Exam  Vitals: BP 136/84   Pulse 86   Temp (!) 97.3 F (36.3 C)   Ht 5' 3 (1.6 m)   Wt 157 lb (71.2 kg)   SpO2 95%   BMI 27.81 kg/m  BMI: Estimated body mass index is 27.81 kg/m as calculated from the following:   Height as of this encounter: 5' 3 (1.6 m).   Weight as of this encounter: 157 lb (71.2 kg). Ideal: Ideal body weight: 52.4 kg (115 lb 8.3 oz) Adjusted ideal body weight: 59.9 kg (132 lb 1.8 oz) General appearance: Well nourished, well developed, and well hydrated. In no apparent acute distress Mental status: Alert, oriented x 3 (person, place, & time)       Respiratory: No evidence of acute respiratory distress Eyes: PERLA   Assessment   Diagnosis Status  1. Lumbar facet arthropathy   2. Degeneration of intervertebral disc of lumbar region with discogenic back pain   3. Chronic pain syndrome    Controlled Controlled Controlled   Patient notes 100% pain relief after her lumbar RFA.  She states that she is more functional and is also more comfortable doing daily tasks.  Continue to monitor, follow-up in 6 months  Plan of Care     Right  SI joint, right piriformis, right L4 medial branch peripheral nerve stimulator 10/04/2023, bilateral L3, L4, L5 RFA 03/15/2024 100% pain relief     Return in about 6 months (around 11/09/2024) for Follow up back pain.    Recent Visits Date Type Provider Dept  03/15/24 Procedure visit Marcelino Nurse, MD [facility]  02/22/24 Office Visit Marcelino Nurse, MD [facility]  Showing recent visits within past 90 days and meeting all other requirements Today's Visits Date Type Provider Dept  05/09/24 Office Visit Marcelino Nurse, MD [facility]  Showing today's visits and meeting all other requirements Future Appointments No visits were found meeting these conditions. Showing future appointments within next 90 days and meeting  all other requirements  I discussed the assessment and treatment plan with the patient. The patient was provided an opportunity to ask questions and all were answered. The patient agreed with the plan and demonstrated an understanding of the instructions.  Patient advised to call back or seek an in-person evaluation if the symptoms or condition worsens.  Duration of encounter: .  Total time on encounter, as per AMA guidelines included both the face-to-face and non-face-to-face time personally spent by the physician and/or other qualified health care professional(s) on the day of the encounter (includes time in activities that require the physician or other qualified health care professional and does not include time in activities normally performed by clinical staff). Physician's time may include the following activities when performed: Preparing to see the patient (e.g., pre-charting review of records, searching for previously ordered imaging, lab work, and nerve conduction tests) Review of prior analgesic pharmacotherapies. Reviewing PMP Interpreting ordered tests (e.g., lab work, imaging, nerve conduction tests) Performing post-procedure evaluations, including interpretation of diagnostic procedures Obtaining and/or reviewing separately obtained history Performing a medically appropriate examination and/or evaluation Counseling and educating the patient/family/caregiver Ordering medications, tests, or procedures Referring and communicating with other health care professionals (when not separately reported) Documenting clinical information in the electronic or other health record Independently interpreting results (not separately reported) and communicating results to the patient/ family/caregiver Care coordination (not separately reported)  Note by: Nurse Marcelino, MD (TTS and AI technology used. I apologize for any typographical errors that were not detected and corrected.) Date:  05/09/2024; Time: 11:42 AM

## 2024-05-09 NOTE — Progress Notes (Signed)
 Safety precautions to be maintained throughout the outpatient stay will include: orient to surroundings, keep bed in low position, maintain call bell within reach at all times, provide assistance with transfer out of bed and ambulation.

## 2024-06-13 ENCOUNTER — Other Ambulatory Visit: Payer: Self-pay | Admitting: Family Medicine

## 2024-06-13 NOTE — Telephone Encounter (Signed)
 Last office visit 12/15/23 for MWV.  Last refilled 03/16/24 for #90 with no refills.  Next Appt: No future appointments with PCP.

## 2024-06-23 ENCOUNTER — Other Ambulatory Visit: Payer: Self-pay | Admitting: Family Medicine

## 2024-07-08 ENCOUNTER — Other Ambulatory Visit: Payer: Self-pay | Admitting: Family Medicine

## 2024-09-08 ENCOUNTER — Other Ambulatory Visit: Payer: Self-pay | Admitting: Family Medicine

## 2024-09-11 NOTE — Telephone Encounter (Signed)
 Last office visit 12/15/23 for MWV.  Last refilled 06/13/24 for #90 with no refills. Next Appt: No future appointments with PCP.

## 2024-09-12 ENCOUNTER — Telehealth: Payer: Self-pay | Admitting: Family Medicine

## 2024-09-12 NOTE — Telephone Encounter (Unsigned)
 Copied from CRM [id number]. Topic: Clinical - Medication Refill >> Sep 12, 2024  3:21 PM Mercedes MATSU wrote: Medication: amitriptyline  (ELAVIL ) 25 MG tablet gabapentin  (NEURONTIN ) 100 MG capsule   Has the patient contacted their pharmacy? Yes (Agent: If no, request that the patient contact the pharmacy for the refill. If patient does not wish to contact the pharmacy document the reason why and proceed with request.) (Agent: If yes, when and what did the pharmacy advise?)  This is the patient's preferred pharmacy:  [id number] - [place], [place] - [address] [place] [address] Phone: [phone] Fax: [phone]  Is this the correct pharmacy for this prescription? Yes If no, delete pharmacy and type the correct one.   Has the prescription been filled recently? Yes  Is the patient out of the medication? Yes  Has the patient been seen for an appointment in the last year OR does the patient have an upcoming appointment? Yes  Can we respond through MyChart? Yes  Agent: Please be advised that Rx refills may take up to 3 business days. We ask that you follow-up with your pharmacy.

## 2024-09-13 MED ORDER — GABAPENTIN 100 MG PO CAPS: 100.0000 mg | ORAL_CAPSULE | Freq: Two times a day (BID) | ORAL | 5 refills | Status: AC

## 2024-09-13 NOTE — Telephone Encounter (Signed)
 Last office visit 12/15/2023 for MWV.  Last refilled 01/31/24 for #60 with 5 refills.  Next appt: No future appointments with PCP.  Amitriptyline  was refilled yesterday 08/13/24.

## 2024-10-07 ENCOUNTER — Other Ambulatory Visit: Payer: Self-pay | Admitting: Family Medicine

## 2024-11-07 ENCOUNTER — Ambulatory Visit: Admitting: Student in an Organized Health Care Education/Training Program

## 2024-11-14 ENCOUNTER — Encounter: Payer: Self-pay | Admitting: Student in an Organized Health Care Education/Training Program

## 2024-11-14 ENCOUNTER — Ambulatory Visit: Admitting: Student in an Organized Health Care Education/Training Program

## 2024-11-14 VITALS — BP 134/79 | HR 92 | Temp 97.4°F | Resp 16 | Ht 63.0 in | Wt 160.0 lb

## 2024-11-14 DIAGNOSIS — M5136 Other intervertebral disc degeneration, lumbar region with discogenic back pain only: Secondary | ICD-10-CM

## 2024-11-14 DIAGNOSIS — M47816 Spondylosis without myelopathy or radiculopathy, lumbar region: Secondary | ICD-10-CM | POA: Diagnosis not present

## 2024-11-14 DIAGNOSIS — G894 Chronic pain syndrome: Secondary | ICD-10-CM | POA: Diagnosis not present

## 2024-11-14 NOTE — Progress Notes (Signed)
 Safety precautions to be maintained throughout the outpatient stay will include: orient to surroundings, keep bed in low position, maintain call bell within reach at all times, provide assistance with transfer out of bed and ambulation.

## 2024-12-15 ENCOUNTER — Encounter

## 2025-05-15 ENCOUNTER — Ambulatory Visit: Admitting: Student in an Organized Health Care Education/Training Program
